# Patient Record
Sex: Male | Born: 1935 | Race: Black or African American | Hispanic: No | Smoking: Former smoker
Health system: Southern US, Community
[De-identification: ages and names within clinical notes are randomized; demographics above are authoritative.]

## PROBLEM LIST (undated history)

## (undated) DIAGNOSIS — F419 Anxiety disorder, unspecified: Secondary | ICD-10-CM

## (undated) DIAGNOSIS — C61 Malignant neoplasm of prostate: Secondary | ICD-10-CM

## (undated) DIAGNOSIS — E782 Mixed hyperlipidemia: Secondary | ICD-10-CM

## (undated) DIAGNOSIS — I2510 Atherosclerotic heart disease of native coronary artery without angina pectoris: Secondary | ICD-10-CM

## (undated) DIAGNOSIS — E119 Type 2 diabetes mellitus without complications: Secondary | ICD-10-CM

## (undated) DIAGNOSIS — R55 Syncope and collapse: Secondary | ICD-10-CM

## (undated) DIAGNOSIS — R001 Bradycardia, unspecified: Secondary | ICD-10-CM

## (undated) DIAGNOSIS — I10 Essential (primary) hypertension: Secondary | ICD-10-CM

## (undated) DIAGNOSIS — R569 Unspecified convulsions: Secondary | ICD-10-CM

## (undated) DIAGNOSIS — K219 Gastro-esophageal reflux disease without esophagitis: Secondary | ICD-10-CM

## (undated) DIAGNOSIS — F0390 Unspecified dementia without behavioral disturbance: Secondary | ICD-10-CM

## (undated) DIAGNOSIS — R1310 Dysphagia, unspecified: Secondary | ICD-10-CM

## (undated) HISTORY — DX: Syncope and collapse: R55

## (undated) HISTORY — DX: Essential (primary) hypertension: I10

## (undated) HISTORY — PX: STOMACH SURGERY: SHX791

## (undated) HISTORY — PX: CATARACT EXTRACTION: SUR2

## (undated) HISTORY — PX: OTHER SURGICAL HISTORY: SHX169

## (undated) HISTORY — PX: PROSTATE SURGERY: SHX751

## (undated) HISTORY — DX: Mixed hyperlipidemia: E78.2

---

## 1999-01-04 ENCOUNTER — Encounter: Payer: Self-pay | Admitting: Orthopedic Surgery

## 1999-01-04 ENCOUNTER — Ambulatory Visit (HOSPITAL_COMMUNITY): Admission: RE | Admit: 1999-01-04 | Discharge: 1999-01-04 | Payer: Self-pay | Admitting: Orthopedic Surgery

## 1999-03-09 ENCOUNTER — Encounter: Payer: Self-pay | Admitting: Orthopedic Surgery

## 1999-03-09 ENCOUNTER — Ambulatory Visit (HOSPITAL_COMMUNITY): Admission: RE | Admit: 1999-03-09 | Discharge: 1999-03-09 | Payer: Self-pay | Admitting: Orthopedic Surgery

## 1999-03-29 ENCOUNTER — Ambulatory Visit (HOSPITAL_COMMUNITY): Admission: RE | Admit: 1999-03-29 | Discharge: 1999-03-29 | Payer: Self-pay | Admitting: Orthopedic Surgery

## 1999-03-29 ENCOUNTER — Encounter: Payer: Self-pay | Admitting: Orthopedic Surgery

## 2002-05-05 ENCOUNTER — Ambulatory Visit (HOSPITAL_COMMUNITY): Admission: RE | Admit: 2002-05-05 | Discharge: 2002-05-06 | Payer: Self-pay

## 2004-03-03 ENCOUNTER — Encounter: Admission: RE | Admit: 2004-03-03 | Discharge: 2004-03-03 | Payer: Self-pay | Admitting: Orthopedic Surgery

## 2004-03-12 ENCOUNTER — Emergency Department (HOSPITAL_COMMUNITY): Admission: EM | Admit: 2004-03-12 | Discharge: 2004-03-12 | Payer: Self-pay | Admitting: Emergency Medicine

## 2004-03-13 ENCOUNTER — Encounter: Admission: RE | Admit: 2004-03-13 | Discharge: 2004-03-13 | Payer: Self-pay | Admitting: Orthopedic Surgery

## 2005-03-01 ENCOUNTER — Ambulatory Visit: Payer: Self-pay | Admitting: Cardiology

## 2005-04-11 ENCOUNTER — Ambulatory Visit: Admission: RE | Admit: 2005-04-11 | Discharge: 2005-04-11 | Payer: Self-pay | Admitting: Orthopedic Surgery

## 2005-05-01 ENCOUNTER — Ambulatory Visit: Payer: Self-pay | Admitting: Cardiology

## 2005-05-01 ENCOUNTER — Inpatient Hospital Stay (HOSPITAL_COMMUNITY): Admission: RE | Admit: 2005-05-01 | Discharge: 2005-05-02 | Payer: Self-pay | Admitting: Orthopedic Surgery

## 2005-05-09 ENCOUNTER — Ambulatory Visit: Payer: Self-pay | Admitting: Cardiology

## 2005-09-18 ENCOUNTER — Inpatient Hospital Stay (HOSPITAL_COMMUNITY): Admission: RE | Admit: 2005-09-18 | Discharge: 2005-09-19 | Payer: Self-pay | Admitting: Orthopedic Surgery

## 2008-02-26 ENCOUNTER — Emergency Department (HOSPITAL_COMMUNITY): Admission: EM | Admit: 2008-02-26 | Discharge: 2008-02-26 | Payer: Self-pay | Admitting: Emergency Medicine

## 2011-01-21 ENCOUNTER — Encounter: Payer: Self-pay | Admitting: Orthopedic Surgery

## 2011-02-10 ENCOUNTER — Encounter: Payer: Self-pay | Admitting: Cardiology

## 2011-02-11 DIAGNOSIS — R55 Syncope and collapse: Secondary | ICD-10-CM

## 2011-02-12 ENCOUNTER — Encounter: Payer: Self-pay | Admitting: Cardiology

## 2011-02-12 DIAGNOSIS — R55 Syncope and collapse: Secondary | ICD-10-CM

## 2011-02-12 DIAGNOSIS — I059 Rheumatic mitral valve disease, unspecified: Secondary | ICD-10-CM

## 2011-02-12 DIAGNOSIS — I495 Sick sinus syndrome: Secondary | ICD-10-CM

## 2011-02-13 ENCOUNTER — Encounter: Payer: Self-pay | Admitting: Cardiology

## 2011-02-22 ENCOUNTER — Encounter: Payer: Self-pay | Admitting: Cardiology

## 2011-04-13 ENCOUNTER — Encounter: Payer: Self-pay | Admitting: Cardiology

## 2011-04-16 ENCOUNTER — Encounter: Payer: Self-pay | Admitting: Cardiology

## 2011-04-17 ENCOUNTER — Encounter: Payer: Medicare Other | Admitting: Cardiology
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
## 2011-05-18 NOTE — Op Note (Signed)
[facility]  Patient:    Kenneth Potter, Kenneth Potter Visit Number: [id number] MRN: [id number]          Service Type: DSU Location: [phone] Attending Physician:  Meredith Leeds Dictated by:   Zigmund Daniel, M.D. Proc. Date: 05/05/02 Admit Date:  05/05/2002 Discharge Date: 05/06/2002   CC:         Rozanna Boer., M.D.   Operative Report  PREOPERATIVE DIAGNOSIS:  Direct left inguinal hernia.  POSTOPERATIVE DIAGNOSIS:  Direct left inguinal hernia.  OPERATION:  Repair of left inguinal hernia.  SURGEON:  Zigmund Daniel, M.D.  ANESTHESIA:  Local with sedation and monitored anesthesia care.  DESCRIPTION OF PROCEDURE:  After the patient was sedated and monitored, and routine preparation and draping of the left inguinal region, I liberally infused 0.5% Marcaine with epinephrine in the subcutaneous tissues and then the deeper tissues near the emergence of the ilioinguinal nerve.  I used more local anesthetic as I proceeded and the patient remained comfortable throughout the case.  I made a transverse incision just from the pubic tubercle laterally to about 5 cm and exposed the external oblique and external ring.  I opened the external oblique in the direction of the fibers into the external ring and a direct hernia was immediately apparent.  I encircled the spermatic cord with a Penrose drain and dissected the hernia away from the cord structures, taking care to avoid injury to the ilioinguinal nerve.  I freed up the cremaster to the level of the internal ring.  I then reduced the hernia and plugged the defect with a plug of polypropylene mesh and held in with a running 2-0 silk stitch.  I then dissected the proximal part of the spermatic cord, and found and reduced a small lipoma, but found no indirect hernia.  I then fashioned a patch of polypropylene mesh to fit the inguinal floor.  I made a slit in it for exit of the spermatic  cord and sewed it in place with running 2-0 Prolene stitch in the pubic tubercle medially and superiorly with a basting stitch in the anterior fascia of the internal oblique laterally and inferiorly, and then a running stitch in the shelving edge of the inguinal ligament.  I used a single suture to join the tails of the split mesh together lateral to the internal ring and felt that the hernia repair was secure.  I put the extra parts of the tails of the mesh laterally under the external oblique and then closed the external oblique with running 3-0 Vicryl and closed the subcutaneous tissue with running 0 Vicryl and closed the skin with intercuticular 4-0 Vicryl and Steri-Strips.  The patient was stable through the procedure. Dictated by:   Zigmund Daniel, M.D. Attending Physician:  Meredith Leeds DD:  05/05/02 TD:  05/06/02 Job: [id number]
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
## 2011-05-18 NOTE — Cardiovascular Report (Signed)
NAME:  DEMARRIO, MENGES NO.:  [id number]   MEDICAL RECORD NO.:  [id number]          PATIENT TYPE:  INP   LOCATION:  4734                         FACILITY:  [facility]   PHYSICIAN:  Arturo Morton. Riley Kill, M.D. [facility] OF BIRTH:  Jun 08, 1926   DATE OF PROCEDURE:  05/02/2005  DATE OF DISCHARGE:                              CARDIAC CATHETERIZATION   INDICATIONS:  Mr. Rhodes is a very delightful 77 year old gentleman who  presents with some atypical symptoms. He is scheduled to have his shoulder  operated on. He had a slightly abnormal Cardiolite. He was seen by Dr. Myrtis Ser  and scheduled for cardiac catheterization.   PROCEDURES:  1.  Left heart catheterization.  2.  Selective coronary arteriography.  3.  Selective left ventriculography.   DESCRIPTION OF PROCEDURE:  The patient was brought to the cath lab and  prepped and draped in the usual fashion. Through an anterior puncture, the  right femoral artery was easily entered.  A 6-French sheath was placed.  Central aortic and left ventricular pressures were measured. Because of  significant hypertension, hydralazine and intravenous Vasotec were both  administered to bring the blood pressure down. Arteriography was performed  with standard Judkins catheters. He tolerated procedure well and there were  no complications. We removed the sheath on the table, and I held the groin  for between 45 minutes and one hour. After the administration of the blood  pressure drugs, his blood pressure remained in the range of 130 to  145  thereafter. Overall he tolerated procedure well.   HEMODYNAMIC DATA.:  1.  Central aortic pressure 185/90.  2.  Left ventricular pressure 187/16.  3.  Less than 10 mm gradient on pullback across aortic valve.   ANGIOGRAPHIC DATA:  1.  The left main coronary artery is large and free of critical disease.  2.  The LAD courses to the apex. There are two major diagonal branches.      There are minimal luminal  irregularities.  The distal LAD wraps the apex      and supplies a significant portion of the inferior wall. No significant      abnormalities are identified.  3.  The circumflex provides basically three marginal branches. The first two      marginal branches are moderate size and bifurcate distally.  The third      is really quite small and supplies the posterolateral segment. No areas      of high-grade focal obstruction are noted.  4.  The right coronary artery is markedly tortuous and supplies a single      PDA. This vessel demonstrates some mild eccentric plaquing of about 20      to 30% and then a segmental area of 50% in the mid vessel that does not      appear to be high grade or flow-limiting. The MLD appears to be in      excess of 2 mm.   Ventriculography in the RAO projection reveals preserved global systolic  function. No segmental abnormalities or contraction are identified.   CONCLUSION:  1.  Well-preserved left ventricular function.  2.  Segmental 50% narrowing in the mid right coronary artery without      critical stenosis.   DISPOSITION:  At the present time there is no anatomic findings that would  require intervention or bypass surgery.      TDS/MEDQ  D:  05/02/2005  T:  05/02/2005  Job:  [id number]   cc:   Kirstie Peri, MD  [address]  [place] [address]  Fax: [phone]   Willa Rough, M.D.   [facility]

## 2011-05-18 NOTE — Op Note (Signed)
NAME:  Kenneth Potter, Kenneth Potter NO.:  [id number]   MEDICAL RECORD NO.:  [id number]          PATIENT TYPE:  INP   LOCATION:  2858                         FACILITY:  [facility]   PHYSICIAN:  Lenard Galloway. Mortenson, M.D.DATE OF BIRTH:  1926-03-11   DATE OF PROCEDURE:  09/18/2005  DATE OF DISCHARGE:                                 OPERATIVE REPORT   PREOPERATIVE DIAGNOSIS:  Severe osteoarthritis of right shoulder.   POSTOPERATIVE DIAGNOSIS:  Severe osteoarthritis of right shoulder.   PROCEDURE:  Hemiarthroplasty of right shoulder using a DePuy Global  Advantage humeral stem, 10-mm diameter, 138-mm length, with a Global  Advantage humeral head size 52 x 18, press fit.   SURGEON:  Lenard Galloway. Chaney Malling, M.D.   ASSISTANT:  Marshall Kenneth Potter.   ANESTHESIA:  General.   PROCEDURE:  The patient was placed on the operating table in the supine  position.  After satisfactory general anesthesia, the patient was placed in  the semi-sitting position.  Her right upper extremity and shoulder was then  prepped with DuraPrep and draped out in the usual manner.  An incision was  made through the deltopectoral area where her previous incision was made.  Skin edges were retracted.  Bleeders were coagulated.  The deltopectoral  interval was developed with a finger, and dissection was carried down to the  anterior aspect of the shoulder.  The coracoid was clearly visualized.  The  deltoid was retracted laterally.  The subscapularis was seen.  There was a  great deal of scarring about the shoulder.  The subscapularis was tagged and  released off its attachment to the upper humeral shaft and reflected in the  midline.  The shoulder joint itself was then opened.  There was severe  flattening and loss of all articular cartilage off the humeral head.  There  was marked deformity of the humeral head.  A drill hole was placed in the  proximal humeral head just adjacent to the greater tuberosity.  A series of  broaches was passed down the humeral canal, and this was all done by hand.  This was reamed out to a 10-mm diameter.  The external guide was then placed  on the reamer and 30 degrees of retroversion was set into the cutting guide.  The humeral head was then amputated following the cutting guide.  A nice  flush cut was made.  The guide was removed.  The reamer was removed.  A 6-mm  broach was passed down the humeral canal.  Then an 8-mm broach was passed  down and this seated very nicely.  A series of different humeral heads was  tried, and the 52 diameter x 18 mm length humeral head seemed to fit very  nicely and was quite stable.  This was removed.  There was a series of  osteophytes over the posterior aspect of the humeral head, and these were  removed.  There was a great deal of scarring of the posterior capsule and  inferior capsule, and this was very carefully stripped off the glenoid.  Once this was all accomplished, there was  fairly good motion about the  shoulder.  There was translocation of the prosthesis about 50% anteriorly  and with full external rotation, the shoulder would not dislocate.  This was  felt to be in perfect position with a perfect size head length and diameter.  All these components were removed.  The shoulder was irrigated with copious  amounts of saline solution.  All debris was removed.  Loose pieces of bone  were found in the axillary recess and removed.  The 10-mm broach was then  passed down the humeral shaft and seated very nicely in the cut surface.  This was felt to be the perfect size prosthesis.  The final prosthesis was  then driven down into position.  The trial head was articulated and the  shoulder put through a full range of motion.  This was very stable with no  instability.  The trial humeral head was removed, and the final humeral head  was put in place and tapped to make a __________  Again, the shoulder was  put through a full range of motion  and was quite stable. It had good motion.  The subscapularis was reattached with 0 Ethibond.  A 0 Vicryl was used to  reattach a portion of deltoid near the clavicle.  A 2-0 Vicryl was used to  close the subcutaneous tissues, and stainless steel staples were used to  close the skin.  Sterile dressing was applied and a shoulder immobilizer  applied and the patient returned to the recovery room in excellent  condition.  In fact, the case went extremely well.   DRAINS:  None.   COMPLICATIONS:  None.  I was very pleased with the surgical outcome.           ______________________________  Lenard Galloway. Chaney Malling, M.D.     RAM/MEDQ  D:  09/18/2005  T:  09/18/2005  Job:  [id number]

## 2011-05-18 NOTE — Discharge Summary (Signed)
NAME:  BALDO, HUFNAGLE NO.:  [id number]   MEDICAL RECORD NO.:  [id number]          PATIENT TYPE:  INP   LOCATION:  4734                         FACILITY:  [facility]   PHYSICIAN:  Lenard Galloway. Mortenson, M.D.DATE OF BIRTH:  1926-06-15   DATE OF ADMISSION:  05/01/2005  DATE OF DISCHARGE:  05/02/2005                                 DISCHARGE SUMMARY   ADDENDUM:  As part of his evaluation, Mr. Deleeuw had a lipid profile  performed. It showed a total cholesterol of 162, triglycerides 48, HDL 42,  LDL 110. The patient had Zocor 40 milligrams added to his medication  regimen. He is to follow up with a lipid profile and liver function testing  in 6 weeks.      RB/MEDQ  D:  05/02/2005  T:  05/02/2005  Job:  [id number]   cc:   Clelia Croft, M.D.  [place] in [place], [place]

## 2012-02-12 DIAGNOSIS — R42 Dizziness and giddiness: Secondary | ICD-10-CM

## 2012-02-12 DIAGNOSIS — I498 Other specified cardiac arrhythmias: Secondary | ICD-10-CM

## 2012-02-26 ENCOUNTER — Encounter: Payer: Self-pay | Admitting: Cardiovascular Disease

## 2012-02-26 ENCOUNTER — Ambulatory Visit (INDEPENDENT_AMBULATORY_CARE_PROVIDER_SITE_OTHER): Payer: Medicare Other | Admitting: Cardiovascular Disease

## 2012-02-26 DIAGNOSIS — R42 Dizziness and giddiness: Secondary | ICD-10-CM

## 2012-02-26 DIAGNOSIS — R001 Bradycardia, unspecified: Secondary | ICD-10-CM | POA: Insufficient documentation

## 2012-02-26 DIAGNOSIS — I498 Other specified cardiac arrhythmias: Secondary | ICD-10-CM

## 2012-02-26 NOTE — Progress Notes (Signed)
HPI  This is an 78 year old male who is here today for followup visit after recent hospitalization. He was briefly hospitalized at [facility] due to dizziness. He was found to be mildly bradycardic with the lowest heart rate of 44 beats per minute. Some of his symptoms were felt to be due to vertigo. He did not have any other arrhythmia on telemetry. He had a similar presentation in early 2012. At that time, he had an echocardiogram and a stress test. Both of them were unremarkable. He had a cardiac catheterization 2006 which showed no evidence of obstructive coronary artery disease. There was a moderate stenosis in the right coronary artery. The patient denies any chest pain or dyspnea. His dizziness has improved since hospital discharge. It may happens in the morning and when he tries to stand up suddenly.  Allergies  Allergen Reactions  . Aspirin     Can not tolerate in high dosage     No current outpatient prescriptions on file prior to visit.     Past Medical History  Diagnosis Date  . Essential hypertension, benign   . Mixed hyperlipidemia   . Near syncope      Past Surgical History  Procedure Date  . Bilateral shoulder surgery   . Prostate surgery   . Stomach surgery     removal of 1/3 of stomach and intestine     History reviewed. No pertinent family history.   History   Social History  . Marital Status: Married    Spouse Name: [name]    Number of Children: N/A  . Years of Education: N/A   Occupational History  . Retired    Social History Main Topics  . Smoking status: Former Smoker    Types: Cigarettes  . Smokeless tobacco: Never Used  . Alcohol Use: No     Quit drinking 25 years ago  . Drug Use: No     Denies any history of illicit drug use  . Sexually Active: Not on file   Other Topics Concern  . Not on file   Social History Narrative  . No narrative on file     PHYSICAL EXAM   BP 154/87  Pulse 66  Ht 5\' 1"  (1.549 m)  Wt 151 lb  (68.493 kg)  BMI 28.53 kg/m2  Constitutional: He is oriented to person, place, and time. He appears well-developed and well-nourished. No distress.  HENT: No nasal discharge.  Head: Normocephalic and atraumatic.  Eyes: Pupils are equal and round. Right eye exhibits no discharge. Left eye exhibits no discharge.  Neck: Normal range of motion. Neck supple. No JVD present. No thyromegaly present.  Cardiovascular: Normal rate, regular rhythm, normal heart sounds and. Exam reveals no gallop and no friction rub. No murmur heard.  Pulmonary/Chest: Effort normal and breath sounds normal. No stridor. No respiratory distress. He has no wheezes. He has no rales. He exhibits no tenderness.  Abdominal: Soft. Bowel sounds are normal. He exhibits no distension. There is no tenderness. There is no rebound and no guarding.  Musculoskeletal: Normal range of motion. He exhibits no edema and no tenderness.  Neurological: He is alert and oriented to person, place, and time. Coordination normal.  Skin: Skin is warm and dry. No rash noted. He is not diaphoretic. No erythema. No pallor.  Psychiatric: He has a normal mood and affect. His behavior is normal. Judgment and thought content normal.        ASSESSMENT AND PLAN

## 2012-02-26 NOTE — Assessment & Plan Note (Signed)
Some of his symptoms seem to be due to vertigo. This has not been associated with any other symptoms such as nausea, vomiting or neurologic symptoms. There is no strong suspicion for posterior cerebral ischemia.

## 2012-02-26 NOTE — Patient Instructions (Signed)
Your physician recommends that you schedule a follow-up appointment in: as needed Your physician recommends that you continue on your current medications as directed. Please refer to the Current Medication list given to you today.   

## 2012-02-26 NOTE — Assessment & Plan Note (Signed)
The patient's heart rate is currently 66 beats per minute. He does have known history of bradycardia which has not been severe enough to require a permanent pacemaker. The lowest heart rate that he had was 42 beats per minute. There is no evidence of high-grade AV block. His symptoms have improved. I recommend avoiding medications that can cause bradycardia. If this worsens in the future, a pacemaker might be needed. I did not repeat the patient's cardiac workup given that it was unremarkable in 2012. He can return to see [place] as needed.

## 2012-02-29 DEATH — deceased

## 2012-07-02 ENCOUNTER — Encounter (HOSPITAL_COMMUNITY): Payer: Self-pay | Admitting: Family Medicine

## 2012-07-02 ENCOUNTER — Ambulatory Visit (HOSPITAL_COMMUNITY)
Admission: AD | Admit: 2012-07-02 | Discharge: 2012-07-07 | Disposition: A | Discharge status: Home Health | Payer: Medicare Other | Source: Other Acute Inpatient Hospital | Attending: Internal Medicine | Admitting: Internal Medicine

## 2012-07-02 ENCOUNTER — Other Ambulatory Visit: Payer: Self-pay | Admitting: Physician Assistant

## 2012-07-02 ENCOUNTER — Ambulatory Visit (INDEPENDENT_AMBULATORY_CARE_PROVIDER_SITE_OTHER): Payer: Medicare Other | Admitting: Physician Assistant

## 2012-07-02 ENCOUNTER — Encounter: Payer: Self-pay | Admitting: Physician Assistant

## 2012-07-02 ENCOUNTER — Encounter (HOSPITAL_COMMUNITY): Payer: Self-pay | Admitting: Pharmacy Technician

## 2012-07-02 VITALS — BP 128/72 | HR 61 | Ht 64.0 in | Wt 151.4 lb

## 2012-07-02 DIAGNOSIS — Z79899 Other long term (current) drug therapy: Secondary | ICD-10-CM | POA: Insufficient documentation

## 2012-07-02 DIAGNOSIS — R0789 Other chest pain: Secondary | ICD-10-CM

## 2012-07-02 DIAGNOSIS — R7309 Other abnormal glucose: Secondary | ICD-10-CM | POA: Insufficient documentation

## 2012-07-02 DIAGNOSIS — I200 Unstable angina: Secondary | ICD-10-CM

## 2012-07-02 DIAGNOSIS — E782 Mixed hyperlipidemia: Secondary | ICD-10-CM | POA: Diagnosis present

## 2012-07-02 DIAGNOSIS — Z7902 Long term (current) use of antithrombotics/antiplatelets: Secondary | ICD-10-CM | POA: Insufficient documentation

## 2012-07-02 DIAGNOSIS — R079 Chest pain, unspecified: Secondary | ICD-10-CM

## 2012-07-02 DIAGNOSIS — I498 Other specified cardiac arrhythmias: Secondary | ICD-10-CM | POA: Insufficient documentation

## 2012-07-02 DIAGNOSIS — R0609 Other forms of dyspnea: Secondary | ICD-10-CM | POA: Insufficient documentation

## 2012-07-02 DIAGNOSIS — R0989 Other specified symptoms and signs involving the circulatory and respiratory systems: Secondary | ICD-10-CM | POA: Insufficient documentation

## 2012-07-02 DIAGNOSIS — I2510 Atherosclerotic heart disease of native coronary artery without angina pectoris: Secondary | ICD-10-CM

## 2012-07-02 DIAGNOSIS — Z8546 Personal history of malignant neoplasm of prostate: Secondary | ICD-10-CM | POA: Insufficient documentation

## 2012-07-02 DIAGNOSIS — R55 Syncope and collapse: Secondary | ICD-10-CM | POA: Insufficient documentation

## 2012-07-02 DIAGNOSIS — I10 Essential (primary) hypertension: Secondary | ICD-10-CM | POA: Insufficient documentation

## 2012-07-02 HISTORY — DX: Atherosclerotic heart disease of native coronary artery without angina pectoris: I25.10

## 2012-07-02 HISTORY — DX: Bradycardia, unspecified: R00.1

## 2012-07-02 LAB — CARDIAC PANEL(CRET KIN+CKTOT+MB+TROPI)
CK, MB: 1.8 ng/mL (ref 0.3–4.0)
Relative Index: INVALID (ref 0.0–2.5)
Troponin I: <0.3 ng/mL (ref <0.30)

## 2012-07-02 MED ORDER — ONDANSETRON HCL 4 MG/2ML IJ SOLN: 4.0000 mg | Freq: Four times a day (QID) | INTRAMUSCULAR | Status: DC | PRN

## 2012-07-02 MED ORDER — SODIUM CHLORIDE 0.9 % IV SOLN: INTRAVENOUS | Status: DC

## 2012-07-02 MED ORDER — HEPARIN (PORCINE) IN NACL 100-0.45 UNIT/ML-% IJ SOLN
950.0000 [IU]/h | INTRAMUSCULAR | Status: DC
  Administered 2012-07-02 – 2012-07-03 (×2): 950 [IU]/h via INTRAVENOUS
  Filled 2012-07-02 (×2): qty 250

## 2012-07-02 MED ORDER — HYDRALAZINE HCL 20 MG/ML IJ SOLN
10.0000 mg | INTRAMUSCULAR | Status: DC | PRN
Start: 2012-07-02 — End: 2012-07-07
  Filled 2012-07-02: qty 0.5

## 2012-07-02 MED ORDER — SIMVASTATIN 20 MG PO TABS
20.0000 mg | ORAL_TABLET | Freq: Every day | ORAL | Status: DC
  Administered 2012-07-02 – 2012-07-06 (×5): 20 mg via ORAL
  Filled 2012-07-02 (×6): qty 1

## 2012-07-02 MED ORDER — LISINOPRIL 20 MG PO TABS
20.0000 mg | ORAL_TABLET | Freq: Two times a day (BID) | ORAL | Status: DC
  Administered 2012-07-02 – 2012-07-07 (×10): 20 mg via ORAL
  Filled 2012-07-02 (×13): qty 1

## 2012-07-02 MED ORDER — SODIUM CHLORIDE 0.9 % IJ SOLN
3.0000 mL | INTRAMUSCULAR | Status: DC | PRN
  Administered 2012-07-02: 3 mL via INTRAVENOUS

## 2012-07-02 MED ORDER — AMLODIPINE BESYLATE 10 MG PO TABS
10.0000 mg | ORAL_TABLET | Freq: Every day | ORAL | Status: DC
  Administered 2012-07-02 – 2012-07-07 (×6): 10 mg via ORAL
  Filled 2012-07-02 (×6): qty 1

## 2012-07-02 MED ORDER — NITROGLYCERIN 0.4 MG SL SUBL
0.4000 mg | SUBLINGUAL_TABLET | SUBLINGUAL | Status: DC | PRN
  Filled 2012-07-02: qty 25

## 2012-07-02 MED ORDER — HEPARIN BOLUS VIA INFUSION
3000.0000 [IU] | Freq: Once | INTRAVENOUS | Status: AC
  Administered 2012-07-02: 3000 [IU] via INTRAVENOUS
  Filled 2012-07-02: qty 3000

## 2012-07-02 MED ORDER — ASPIRIN EC 81 MG PO TBEC
81.0000 mg | DELAYED_RELEASE_TABLET | Freq: Every day | ORAL | Status: DC
  Administered 2012-07-02 – 2012-07-07 (×5): 81 mg via ORAL
  Filled 2012-07-02 (×6): qty 1

## 2012-07-02 MED ORDER — SODIUM CHLORIDE 0.9 % IJ SOLN: 3.0000 mL | INTRAMUSCULAR | Status: DC | PRN

## 2012-07-02 MED ORDER — AMLODIPINE BESYLATE 5 MG PO TABS
5.0000 mg | ORAL_TABLET | Freq: Every day | ORAL | Status: DC
  Filled 2012-07-02: qty 1

## 2012-07-02 MED ORDER — DIAZEPAM 5 MG PO TABS
5.0000 mg | ORAL_TABLET | ORAL | Status: AC
  Administered 2012-07-04: 5 mg via ORAL
  Filled 2012-07-02: qty 1

## 2012-07-02 MED ORDER — SODIUM CHLORIDE 0.9 % IJ SOLN
3.0000 mL | Freq: Two times a day (BID) | INTRAMUSCULAR | Status: DC
  Administered 2012-07-02 – 2012-07-06 (×8): 3 mL via INTRAVENOUS

## 2012-07-02 MED ORDER — ASPIRIN 81 MG PO CHEW: 324.0000 mg | CHEWABLE_TABLET | ORAL | Status: DC

## 2012-07-02 MED ORDER — SODIUM CHLORIDE 0.9 % IV SOLN: 250.0000 mL | INTRAVENOUS | Status: DC | PRN

## 2012-07-02 MED ORDER — SODIUM CHLORIDE 0.9 % IJ SOLN
3.0000 mL | Freq: Two times a day (BID) | INTRAMUSCULAR | Status: DC
  Administered 2012-07-02 – 2012-07-03 (×3): 3 mL via INTRAVENOUS

## 2012-07-02 MED ORDER — ACETAMINOPHEN 325 MG PO TABS
650.0000 mg | ORAL_TABLET | ORAL | Status: DC | PRN
  Administered 2012-07-02 – 2012-07-03 (×2): 650 mg via ORAL
  Filled 2012-07-02 (×2): qty 2

## 2012-07-02 MED ORDER — SODIUM CHLORIDE 0.9 % IV SOLN
250.0000 mL | INTRAVENOUS | Status: DC | PRN
Start: 2012-07-02 — End: 2012-07-07

## 2012-07-02 MED ORDER — NITROGLYCERIN IN D5W 200-5 MCG/ML-% IV SOLN
2.0000 ug/min | INTRAVENOUS | Status: DC
  Filled 2012-07-02: qty 250

## 2012-07-02 NOTE — Assessment & Plan Note (Signed)
Patient presents with new onset chest discomfort, typically precipitated by walking, with acceleration over the last week. He has no known CAD, with history of a negative catheterization in 2006, and a negative, nondiagnostic stress test, 2/12. He has history of normal LV function. Cardiac risk factors are notable for HTN, HLD, age, and remote tobacco. He is currently hemodynamically stable, and 12-lead EKG is negative for acute changes. However, patient's presenting symptoms are quite worrisome for significant CAD, and recommendation is to refer him directly to [facility] ED for initial blood work and repeat EKG. We will then plan to either transfer him directly to [facility] later today, or in the a.m., for recommended cardiac catheterization. Patient is agreeable with the plan for a repeat catheterization, to rule out significant CAD. If cardiac markers are abnormal, he will need to be started on IV heparin. We will defer treatment with Plavix, given his advanced age and possibility of multivessel CAD.

## 2012-07-02 NOTE — Progress Notes (Signed)
ANTICOAGULATION CONSULT NOTE - Initial Consult  Pharmacy Consult for Heparin Indication: chest pain/ACS  Allergies  Allergen Reactions  . Aspirin     Can not tolerate in high dosage    Patient Measurements:   Heparin Dosing Weight: 68.7kg  Vital Signs: Temp: 98.1 F (36.7 C) (07/03 2038) Temp src: Oral (07/03 2038) BP: 189/76 mmHg (07/03 1950) Pulse Rate: 57  (07/03 1950)  Labs: No results found for this basename: HGB:2,HCT:3,PLT:3,APTT:3,LABPROT:3,INR:3,HEPARINUNFRC:3,CREATININE:3,CKTOTAL:3,CKMB:3,TROPONINI:3 in the last 72 hours  CrCl is unknown because no creatinine reading has been taken.   Medical History: Past Medical History  Diagnosis Date  . Essential hypertension, benign   . Mixed hyperlipidemia   . Near syncope   . Anginal pain   . Shortness of breath   . Cancer     Prostate cancer   Assessment: 86yom with Hx CAD presented with CP at MD office today.  He was sent to [facility] ED started on NTG, cardiac enzymes were negative x1, CBC, renal fx stable-done at [facility] .  Amitted to [facility] for ACS.  Heparin drip to start.   Goal of Therapy:  Heparin level 0.3-0.7 units/ml Monitor platelets by anticoagulation protocol: Yes   Plan:  Heparin bolus 3000 uts IV x1 Heparin drip 950 uts/hr Daily Heparin level, CBC  Marcelino Scot 07/02/2012,8:40 PM

## 2012-07-02 NOTE — Progress Notes (Signed)
Primary Cardiologist: Kenneth Bunting, MD   HPI: Patient presents with complaint of recent development of CP, both with/without exertion, but typically precipitated by walking after short distance. He states that this is new, and dissimilar from his usual reflux symptoms. He describes it as dull, with no radiation to jaw or upper extremities. There is some associated dyspnea, but no diaphoresis or nausea. It is particularly intense (9/10), and is relieved after several minutes of rest. He suggests some acceleration over this past week. He had some earlier this morning, as he was getting dressed, and which resolve with rest.  12-lead EKG in office today, reviewed by me, indicates NSR with isolated PVC, and no acute changes.  Patient has no known CAD. Cardiac catheterization in 2006 was negative for significant CAD. He had a negative, inadequate (69% PMHR) exercise stress Cardiolite, 2/12, reviewed by Dr. Andee Potter, which was negative for definite ischemia; EF 61%. A 2-D echo at that time indicated normal LVF (EF 60-65%), with diastolic dysfunction, and mild MR and mild TR.  Allergies  Allergen Reactions  . Aspirin     Can not tolerate in high dosage    Current Outpatient Prescriptions  Medication Sig Dispense Refill  . amLODipine (NORVASC) 5 MG tablet Take 5 mg by mouth daily.      [name] lisinopril (PRINIVIL,ZESTRIL) 20 MG tablet Take 20 mg by mouth 2 (two) times daily.      . simvastatin (ZOCOR) 20 MG tablet Take 20 mg by mouth every evening.        Past Medical History  Diagnosis Date  . Essential hypertension, benign   . Mixed hyperlipidemia   . Near syncope     Past Surgical History  Procedure Date  . Bilateral shoulder surgery   . Prostate surgery   . Stomach surgery     removal of 1/3 of stomach and intestine    History   Social History  . Marital Status: Married    Spouse Name: Kenneth Potter    Number of Children: N/A  . Years of Education: N/A   Occupational History  . Retired     Social History Main Topics  . Smoking status: Former Smoker    Types: Cigarettes  . Smokeless tobacco: Never Used  . Alcohol Use: No     Quit drinking 25 years ago  . Drug Use: No     Denies any history of illicit drug use  . Sexually Active: Not on file   Other Topics Concern  . Not on file   Social History Narrative  . No narrative on file    No family history on file.  ROS: no nausea, vomiting; no fever, chills; no melena, hematochezia; no claudication  PHYSICAL EXAM: BP 128/72  Pulse 61  Ht 5\' 4"  (1.626 m)  Wt 151 lb 6.4 oz (68.675 kg)  BMI 25.99 kg/m2 GENERAL: 78 year-old male, sitting upright; NAD HEENT: NCAT, PERRLA, EOMI; sclera clear; no xanthelasma NECK: palpable bilateral carotid pulses, no bruits; no JVD; no TM LUNGS: CTA bilaterally CARDIAC: RRR (S1, S2); no significant murmurs; no rubs or gallops ABDOMEN: soft, non-tender; intact BS EXTREMETIES: intact femoral and distal pulses, no femoral bruits; no significant peripheral edema SKIN: warm/dry; no obvious rash/lesions MUSCULOSKELETAL: no joint deformity NEURO: no focal deficit; NL affect   EKG: reviewed and available in Electronic Records   ASSESSMENT & PLAN:  Unstable angina pectoris  Patient presents with new onset chest discomfort, typically precipitated by walking, with acceleration over the last week. He  has no known CAD, with history of a negative catheterization in 2006, and a negative, nondiagnostic stress test, 2/12. He has history of normal LV function. Cardiac risk factors are notable for HTN, HLD, age, and remote tobacco. He is currently hemodynamically stable, and 12-lead EKG is negative for acute changes. However, patient's presenting symptoms are quite worrisome for significant CAD, and recommendation is to refer him directly to [facility] ED for initial blood work and repeat EKG. We will then plan to either transfer him directly to [facility] later today, or in the a.m., for recommended cardiac  catheterization. Patient is agreeable with the plan for a repeat catheterization, to rule out significant CAD. If cardiac markers are abnormal, he will need to be started on IV heparin. We will defer treatment with Plavix, given his advanced age and possibility of multivessel CAD.    Kenneth Potter, PAC

## 2012-07-02 NOTE — Patient Instructions (Addendum)
Go to [facility] Emergency Department. Orders sent with patient.

## 2012-07-02 NOTE — H&P (Signed)
Primary Cardiologist: Lewayne Bunting, MD     HPI: Patient presents with complaint of recent development of CP, both with/without exertion, but typically precipitated by walking after short distance. He states that this is new, and dissimilar from his usual reflux symptoms. He describes it as dull, with no radiation to jaw or upper extremities. There is some associated dyspnea, but no diaphoresis or nausea. It is particularly intense (9/10), and is relieved after several minutes of rest. He suggests some acceleration over this past week. He had some earlier this morning, as he was getting dressed, and which resolve with rest.   12-lead EKG in office today, reviewed by me, indicates NSR with isolated PVC, and no acute changes.   Patient has no known CAD. Cardiac catheterization in 2006 was negative for significant CAD. He had a negative, inadequate (69% PMHR) exercise stress Cardiolite, 2/12, reviewed by Dr. Andee Lineman, which was negative for definite ischemia; EF 61%. A 2-D echo at that time indicated normal LVF (EF 60-65%), with diastolic dysfunction, and mild MR and mild TR.    Allergies   Allergen  Reactions   .  Aspirin         Can not tolerate in high dosage       Current Outpatient Prescriptions   Medication  Sig  Dispense  Refill   .  amLODipine (NORVASC) 5 MG tablet  Take 5 mg by mouth daily.         [name]  lisinopril (PRINIVIL,ZESTRIL) 20 MG tablet  Take 20 mg by mouth 2 (two) times daily.         .  simvastatin (ZOCOR) 20 MG tablet  Take 20 mg by mouth every evening.             Past Medical History   Diagnosis  Date   .  Essential hypertension, benign     .  Mixed hyperlipidemia     .  Near syncope         Past Surgical History   Procedure  Date   .  Bilateral shoulder surgery     .  Prostate surgery     .  Stomach surgery         removal of 1/3 of stomach and intestine       History       Social History   .  Marital Status:  Married       Spouse Name:  [name]    Number of Children:  N/A   .  Years of Education:  N/A       Occupational History   .  Retired         Social History Main Topics   .  Smoking status:  Former Smoker       Types:  Cigarettes   .  Smokeless tobacco:  Never Used   .  Alcohol Use:  No         Quit drinking 25 years ago   .  Drug Use:  No         Denies any history of illicit drug use   .  Sexually Active:  Not on file       Other Topics  Concern   .  Not on file       Social History Narrative   .  No narrative on file      No family history on file.   ROS: no nausea, vomiting; no fever, chills; no melena, hematochezia;  no claudication   PHYSICAL EXAM: BP 128/72  Pulse 61  Ht 5\' 4"  (1.626 m)  Wt 151 lb 6.4 oz (68.675 kg)  BMI 25.99 kg/m2 GENERAL: 78 year-old male, sitting upright; NAD HEENT: NCAT, PERRLA, EOMI; sclera clear; no xanthelasma NECK: palpable bilateral carotid pulses, no bruits; no JVD; no TM LUNGS: CTA bilaterally CARDIAC: RRR (S1, S2); no significant murmurs; no rubs or gallops ABDOMEN: soft, non-tender; intact BS EXTREMETIES: intact femoral and distal pulses, no femoral bruits; no significant peripheral edema SKIN: warm/dry; no obvious rash/lesions MUSCULOSKELETAL: no joint deformity NEURO: no focal deficit; NL affect  EKG: reviewed and available in Electronic Records  ASSESSMENT & PLAN:   Unstable angina pectoris  Patient presents with new onset chest discomfort, typically precipitated by walking, with acceleration over the last week. He has no known CAD, with history of a negative catheterization in 2006, and a negative, nondiagnostic stress test, 2/12. He has history of normal LV function. Cardiac risk factors are notable for HTN, HLD, age, and remote tobacco. He is currently hemodynamically stable, and 12-lead EKG is negative for acute changes. However, patient's presenting symptoms are quite worrisome for significant CAD, and recommendation is to refer him directly to [facility]  ED for initial blood work and repeat EKG. We will then plan to either transfer him directly to [facility] later today, or in the a.m., for recommended cardiac catheterization. Patient is agreeable with the plan for a repeat catheterization, to rule out significant CAD. If cardiac markers are abnormal, he will need to be started on IV heparin. We will defer treatment with Plavix, given his advanced age and possibility of multivessel CAD.     Gene Serpe, PAC _____________  ADDENDUM     See details of H&P as outlined above. Pt arrived from [facility] hospital this evening.  He is currently chest pain free though complains of a headache (on ntg @ 54mcg/min).  Exam is otw unchanged from above.  Filed Vitals:   07/02/12 1950  BP: 189/76  Pulse: 57  Temp: 97.9 F (36.6 C)  Resp: 21   Labs and ECG's reviewed from [facility] ED.  CBC, BMET, Coags, CE are WNL (results on paper chart).  BP is currently elevated.  Resume home meds and titrate for HTN.  Will d/c ntg given headache, lack of c/p, and negative enzymes so far.  Cont to cycle CE.  Plan cath on Friday or sooner if recurrent/recalcitrant chest pain.  Nicolasa Ducking NP I have taken a history, reviewed medications, allergies, PMH, SH, FH, and reviewed ROS and examined the patient.  I agree with the assessment and plan.  Ohm Dentler C. Daleen Squibb, MD, [facility] Pager:  [phone]

## 2012-07-03 DIAGNOSIS — I200 Unstable angina: Secondary | ICD-10-CM

## 2012-07-03 LAB — BASIC METABOLIC PANEL
BUN: 12 mg/dL (ref 6–23)
Chloride: 104 mEq/L (ref 96–112)
GFR calc Af Amer: 75 mL/min — ABNORMAL LOW (ref >90)
GFR calc non Af Amer: 65 mL/min — ABNORMAL LOW (ref >90)
Potassium: 3.6 mEq/L (ref 3.5–5.1)
Sodium: 139 mEq/L (ref 135–145)

## 2012-07-03 LAB — CBC
HCT: 39.1 % (ref 39.0–52.0)
Hemoglobin: 13.7 g/dL (ref 13.0–17.0)
MCH: 32.4 pg (ref 26.0–34.0)
MCHC: 35 g/dL (ref 30.0–36.0)
MCV: 92.4 fL (ref 78.0–100.0)
Platelets: 157 K/uL (ref 150–400)
RBC: 4.23 MIL/uL (ref 4.22–5.81)
RDW: 13.3 % (ref 11.5–15.5)
WBC: 7.6 K/uL (ref 4.0–10.5)

## 2012-07-03 LAB — HEMOGLOBIN A1C: Mean Plasma Glucose: 171 mg/dL — ABNORMAL HIGH (ref <117)

## 2012-07-03 LAB — TSH: TSH: 1.495 u[IU]/mL (ref 0.350–4.500)

## 2012-07-03 LAB — LIPID PANEL
Cholesterol: 116 mg/dL (ref 0–200)
HDL: 36 mg/dL — ABNORMAL LOW (ref >39)
Total CHOL/HDL Ratio: 3.2 RATIO
Triglycerides: 82 mg/dL (ref <150)
VLDL: 16 mg/dL (ref 0–40)

## 2012-07-03 LAB — HEPARIN LEVEL (UNFRACTIONATED): Heparin Unfractionated: 0.56 [IU]/mL (ref 0.30–0.70)

## 2012-07-03 LAB — CARDIAC PANEL(CRET KIN+CKTOT+MB+TROPI): Troponin I: <0.3 ng/mL (ref <0.30)

## 2012-07-03 MED ORDER — ASPIRIN 81 MG PO CHEW
324.0000 mg | CHEWABLE_TABLET | ORAL | Status: AC
Start: 2012-07-04 — End: 2012-07-04
  Administered 2012-07-04: 324 mg via ORAL
  Filled 2012-07-03: qty 4

## 2012-07-03 NOTE — Progress Notes (Signed)
ANTICOAGULATION CONSULT NOTE - Initial Consult  Pharmacy Consult for Heparin Indication: chest pain/ACS  Allergies  Allergen Reactions  . Aspirin     Can not tolerate in high dosage    Patient Measurements: Height: 5\' 4"  (162.6 cm) Weight: 147 lb 4.3 oz (66.8 kg) IBW/kg (Calculated) : 59.2  Heparin Dosing Weight: 68.7kg  Vital Signs: Temp: 98.1 F (36.7 C) (07/04 0357) Temp src: Oral (07/04 0357) BP: 131/75 mmHg (07/04 0357) Pulse Rate: 50  (07/04 0357)  Labs:  Alvira Philips 07/03/12 0320 07/02/12 2123  HGB 13.7 --  HCT 39.1 --  PLT 157 --  APTT -- --  LABPROT -- --  INR -- --  HEPARINUNFRC 0.56 --  CREATININE -- --  CKTOTAL -- 77  CKMB -- 1.8  TROPONINI -- <0.30    CrCl is unknown because no creatinine reading has been taken.  Assessment: 78 yo male with chest pain for Heparin    Goal of Therapy:  Heparin level 0.3-0.7 units/ml Monitor platelets by anticoagulation protocol: Yes   Plan:  Continue Heparin at current rate  Eddie Candle 07/03/2012,4:12 AM

## 2012-07-03 NOTE — Progress Notes (Signed)
 SUBJECTIVE:  Mild chest pain off an on through the night.     PHYSICAL EXAM Filed Vitals:   07/02/12 2200 07/02/12 2300 07/03/12 0357 07/03/12 0747  BP: 151/73 122/75 131/75 144/69  Pulse: 68 57 50 49  Temp:  98.1 F (36.7 C) 98.1 F (36.7 C) 98 F (36.7 C)  TempSrc:  Oral Oral Oral  Resp: 17 17 14 15  Height:      Weight:      SpO2: 98% 98% 99% 97%   General:  No distress Lungs:  Few basilar crackles Heart:  RRR Abdomen:  Positive bowel sounds, no rebound no guarding Extremities:  No edema  LABS: Lab Results  Component Value Date   CKTOTAL 71 07/03/2012   CKMB 1.7 07/03/2012   TROPONINI <0.30 07/03/2012   Results for orders placed during the hospital encounter of 07/02/12 (from the past 24 hour(s))  MRSA PCR SCREENING     Status: Normal   Collection Time   07/02/12  7:49 PM      Component Value Range   MRSA by PCR NEGATIVE  NEGATIVE  CARDIAC PANEL(CRET KIN+CKTOT+MB+TROPI)     Status: Normal   Collection Time   07/02/12  9:23 PM      Component Value Range   Total CK 77  7 - 232 U/L   CK, MB 1.8  0.3 - 4.0 ng/mL   Troponin I <0.30  <0.30 ng/mL   Relative Index RELATIVE INDEX IS INVALID  0.0 - 2.5  HEMOGLOBIN A1C     Status: Abnormal   Collection Time   07/02/12  9:23 PM      Component Value Range   Hemoglobin A1C 7.6 (*) <5.7 %   Mean Plasma Glucose 171 (*) <117 mg/dL  TSH     Status: Normal   Collection Time   07/02/12  9:23 PM      Component Value Range   TSH 1.495  0.350 - 4.500 uIU/mL  CARDIAC PANEL(CRET KIN+CKTOT+MB+TROPI)     Status: Normal   Collection Time   07/03/12  3:18 AM      Component Value Range   Total CK 71  7 - 232 U/L   CK, MB 1.7  0.3 - 4.0 ng/mL   Troponin I <0.30  <0.30 ng/mL   Relative Index RELATIVE INDEX IS INVALID  0.0 - 2.5  BASIC METABOLIC PANEL     Status: Abnormal   Collection Time   07/03/12  3:20 AM      Component Value Range   Sodium 139  135 - 145 mEq/L   Potassium 3.6  3.5 - 5.1 mEq/L   Chloride 104  96 - 112 mEq/L   CO2 23   19 - 32 mEq/L   Glucose, Bld 182 (*) 70 - 99 mg/dL   BUN 12  6 - 23 mg/dL   Creatinine, Ser 1.03  0.50 - 1.35 mg/dL   Calcium 8.6  8.4 - 10.5 mg/dL   GFR calc non Af Amer 65 (*) >90 mL/min   GFR calc Af Amer 75 (*) >90 mL/min  HEPARIN LEVEL (UNFRACTIONATED)     Status: Normal   Collection Time   07/03/12  3:20 AM      Component Value Range   Heparin Unfractionated 0.56  0.30 - 0.70 IU/mL  CBC     Status: Normal   Collection Time   07/03/12  3:20 AM      Component Value Range   WBC 7.6  4.0 -   10.5 K/uL   RBC 4.23  4.22 - 5.81 MIL/uL   Hemoglobin 13.7  13.0 - 17.0 g/dL   HCT 39.1  39.0 - 52.0 %   MCV 92.4  78.0 - 100.0 fL   MCH 32.4  26.0 - 34.0 pg   MCHC 35.0  30.0 - 36.0 g/dL   RDW 13.3  11.5 - 15.5 %   Platelets 157  150 - 400 K/uL  LIPID PANEL     Status: Abnormal   Collection Time   07/03/12  3:20 AM      Component Value Range   Cholesterol 116  0 - 200 mg/dL   Triglycerides 82  <150 mg/dL   HDL 36 (*) >39 mg/dL   Total CHOL/HDL Ratio 3.2     VLDL 16  0 - 40 mg/dL   LDL Cholesterol 64  0 - 99 mg/dL    Intake/Output Summary (Last 24 hours) at 07/03/12 0827 Last data filed at 07/03/12 0720  Gross per 24 hour  Intake 928.28 ml  Output    276 ml  Net 652.28 ml    EKG:  Sinus bradycardia rate 52. Early repolarization pattern.  07/03/2012  ASSESSMENT AND PLAN:  Chest pain:  Ruled out.  Plan cath in am.  The patient understands that risks included but are not limited to stroke (1 in 1000), death (1 in 1000), kidney failure [usually temporary] (1 in 500), bleeding (1 in 200), allergic reaction [possibly serious] (1 in 200).  The patient understands and agrees to proceed.   HTN:  BP is somewhat labile.  Continue current therapy.    Hyperlipidemia:  LDL 64, HDL 36.  Continue current therapy.    Zaleigh Bermingham 07/03/2012 8:27 AM   

## 2012-07-04 ENCOUNTER — Encounter (HOSPITAL_COMMUNITY)
Admission: AD | Disposition: A | Discharge status: Home Health | Payer: Self-pay | Source: Other Acute Inpatient Hospital | Attending: Internal Medicine

## 2012-07-04 ENCOUNTER — Other Ambulatory Visit: Payer: Self-pay

## 2012-07-04 ENCOUNTER — Ambulatory Visit (HOSPITAL_COMMUNITY): Admit: 2012-07-04 | Payer: Self-pay | Admitting: Cardiovascular Disease

## 2012-07-04 ENCOUNTER — Encounter (HOSPITAL_COMMUNITY): Payer: Self-pay | Admitting: Physician Assistant

## 2012-07-04 DIAGNOSIS — I2510 Atherosclerotic heart disease of native coronary artery without angina pectoris: Secondary | ICD-10-CM

## 2012-07-04 HISTORY — PX: LEFT HEART CATHETERIZATION WITH CORONARY ANGIOGRAM: SHX5451

## 2012-07-04 LAB — CBC
MCH: 32.2 pg (ref 26.0–34.0)
MCHC: 35.1 g/dL (ref 30.0–36.0)
Platelets: 166 10*3/uL (ref 150–400)
RBC: 4.41 MIL/uL (ref 4.22–5.81)

## 2012-07-04 LAB — HEPARIN LEVEL (UNFRACTIONATED): Heparin Unfractionated: 1.14 IU/mL — ABNORMAL HIGH (ref 0.30–0.70)

## 2012-07-04 SURGERY — LEFT HEART CATHETERIZATION WITH CORONARY ANGIOGRAM
Anesthesia: LOCAL

## 2012-07-04 MED ORDER — LIDOCAINE HCL (PF) 1 % IJ SOLN
INTRAMUSCULAR | Status: AC
  Filled 2012-07-04: qty 30

## 2012-07-04 MED ORDER — ASPIRIN 81 MG PO TBEC: 81.0000 mg | DELAYED_RELEASE_TABLET | Freq: Every day | ORAL | Status: DC

## 2012-07-04 MED ORDER — HEPARIN (PORCINE) IN NACL 2-0.9 UNIT/ML-% IJ SOLN
INTRAMUSCULAR | Status: AC
  Filled 2012-07-04: qty 2000

## 2012-07-04 MED ORDER — HEPARIN (PORCINE) IN NACL 100-0.45 UNIT/ML-% IJ SOLN
750.0000 [IU]/h | INTRAMUSCULAR | Status: DC
  Filled 2012-07-04: qty 250

## 2012-07-04 MED ORDER — SODIUM CHLORIDE 0.9 % IV SOLN: INTRAVENOUS | Status: DC

## 2012-07-04 MED ORDER — SODIUM CHLORIDE 0.9 % IJ SOLN: 3.0000 mL | INTRAMUSCULAR | Status: DC | PRN

## 2012-07-04 MED ORDER — MAGNESIUM HYDROXIDE 400 MG/5ML PO SUSP
30.0000 mL | Freq: Every day | ORAL | Status: DC | PRN
  Administered 2012-07-05: 30 mL via ORAL
  Filled 2012-07-04 (×2): qty 30

## 2012-07-04 MED ORDER — AMLODIPINE BESYLATE 10 MG PO TABS: 10.0000 mg | ORAL_TABLET | Freq: Every day | ORAL | Status: DC

## 2012-07-04 MED ORDER — HYDRALAZINE HCL 20 MG/ML IJ SOLN
INTRAMUSCULAR | Status: AC
  Filled 2012-07-04: qty 1

## 2012-07-04 MED ORDER — MIDAZOLAM HCL 2 MG/2ML IJ SOLN
INTRAMUSCULAR | Status: AC
  Filled 2012-07-04: qty 2

## 2012-07-04 MED ORDER — SODIUM CHLORIDE 0.9 % IV SOLN
INTRAVENOUS | Status: AC
  Administered 2012-07-04: 11:00:00 via INTRAVENOUS

## 2012-07-04 MED ORDER — SODIUM CHLORIDE 0.9 % IV SOLN: 250.0000 mL | INTRAVENOUS | Status: DC | PRN

## 2012-07-04 MED ORDER — NITROGLYCERIN 0.2 MG/ML ON CALL CATH LAB
INTRAVENOUS | Status: AC
  Filled 2012-07-04: qty 1

## 2012-07-04 MED ORDER — SODIUM CHLORIDE 0.9 % IJ SOLN: 3.0000 mL | Freq: Two times a day (BID) | INTRAMUSCULAR | Status: DC

## 2012-07-04 MED FILL — Nitroglycerin IV Soln 200 MCG/ML in D5W: INTRAVENOUS | Qty: 250 | Status: AC

## 2012-07-04 NOTE — H&P (View-Only) (Signed)
SUBJECTIVE:  Mild chest pain off an on through the night.     PHYSICAL EXAM Filed Vitals:   07/02/12 2200 07/02/12 2300 07/03/12 0357 07/03/12 0747  BP: 151/73 122/75 131/75 144/69  Pulse: 68 57 50 49  Temp:  98.1 F (36.7 C) 98.1 F (36.7 C) 98 F (36.7 C)  TempSrc:  Oral Oral Oral  Resp: 17 17 14 15   Height:      Weight:      SpO2: 98% 98% 99% 97%   General:  No distress Lungs:  Few basilar crackles Heart:  RRR Abdomen:  Positive bowel sounds, no rebound no guarding Extremities:  No edema  LABS: Lab Results  Component Value Date   CKTOTAL 71 07/03/2012   CKMB 1.7 07/03/2012   TROPONINI <0.30 07/03/2012   Results for orders placed during the hospital encounter of 07/02/12 (from the past 24 hour(s))  MRSA PCR SCREENING     Status: Normal   Collection Time   07/02/12  7:49 PM      Component Value Range   MRSA by PCR NEGATIVE  NEGATIVE  CARDIAC PANEL(CRET KIN+CKTOT+MB+TROPI)     Status: Normal   Collection Time   07/02/12  9:23 PM      Component Value Range   Total CK 77  7 - 232 U/L   CK, MB 1.8  0.3 - 4.0 ng/mL   Troponin I <0.30  <0.30 ng/mL   Relative Index RELATIVE INDEX IS INVALID  0.0 - 2.5  HEMOGLOBIN A1C     Status: Abnormal   Collection Time   07/02/12  9:23 PM      Component Value Range   Hemoglobin A1C 7.6 (*) <5.7 %   Mean Plasma Glucose 171 (*) <117 mg/dL  TSH     Status: Normal   Collection Time   07/02/12  9:23 PM      Component Value Range   TSH 1.495  0.350 - 4.500 uIU/mL  CARDIAC PANEL(CRET KIN+CKTOT+MB+TROPI)     Status: Normal   Collection Time   07/03/12  3:18 AM      Component Value Range   Total CK 71  7 - 232 U/L   CK, MB 1.7  0.3 - 4.0 ng/mL   Troponin I <0.30  <0.30 ng/mL   Relative Index RELATIVE INDEX IS INVALID  0.0 - 2.5  BASIC METABOLIC PANEL     Status: Abnormal   Collection Time   07/03/12  3:20 AM      Component Value Range   Sodium 139  135 - 145 mEq/L   Potassium 3.6  3.5 - 5.1 mEq/L   Chloride 104  96 - 112 mEq/L   CO2 23   19 - 32 mEq/L   Glucose, Bld 182 (*) 70 - 99 mg/dL   BUN 12  6 - 23 mg/dL   Creatinine, Ser 1.61  0.50 - 1.35 mg/dL   Calcium 8.6  8.4 - 09.6 mg/dL   GFR calc non Af Amer 65 (*) >90 mL/min   GFR calc Af Amer 75 (*) >90 mL/min  HEPARIN LEVEL (UNFRACTIONATED)     Status: Normal   Collection Time   07/03/12  3:20 AM      Component Value Range   Heparin Unfractionated 0.56  0.30 - 0.70 IU/mL  CBC     Status: Normal   Collection Time   07/03/12  3:20 AM      Component Value Range   WBC 7.6  4.0 -  10.5 K/uL   RBC 4.23  4.22 - 5.81 MIL/uL   Hemoglobin 13.7  13.0 - 17.0 g/dL   HCT 91.4  78.2 - 95.6 %   MCV 92.4  78.0 - 100.0 fL   MCH 32.4  26.0 - 34.0 pg   MCHC 35.0  30.0 - 36.0 g/dL   RDW 21.3  08.6 - 57.8 %   Platelets 157  150 - 400 K/uL  LIPID PANEL     Status: Abnormal   Collection Time   07/03/12  3:20 AM      Component Value Range   Cholesterol 116  0 - 200 mg/dL   Triglycerides 82  <469 mg/dL   HDL 36 (*) >62 mg/dL   Total CHOL/HDL Ratio 3.2     VLDL 16  0 - 40 mg/dL   LDL Cholesterol 64  0 - 99 mg/dL    Intake/Output Summary (Last 24 hours) at 07/03/12 0827 Last data filed at 07/03/12 0720  Gross per 24 hour  Intake 928.28 ml  Output    276 ml  Net 652.28 ml    EKG:  Sinus bradycardia rate 52. Early repolarization pattern.  07/03/2012  ASSESSMENT AND PLAN:  Chest pain:  Ruled out.  Plan cath in am.  The patient understands that risks included but are not limited to stroke (1 in 1000), death (1 in 1000), kidney failure [usually temporary] (1 in 500), bleeding (1 in 200), allergic reaction [possibly serious] (1 in 200).  The patient understands and agrees to proceed.   HTN:  BP is somewhat labile.  Continue current therapy.    Hyperlipidemia:  LDL 64, HDL 36.  Continue current therapy.    Fayrene Fearing [facility] 07/03/2012 8:27 AM

## 2012-07-04 NOTE — Progress Notes (Signed)
ANTICOAGULATION CONSULT NOTE - Follow Up Consult  Pharmacy Consult for heparin Indication: chest pain/ACS  Labs:  Basename 07/04/12 0414 07/03/12 0320 07/03/12 0318 07/02/12 2123  HGB 14.2 13.7 -- --  HCT 40.4 39.1 -- --  PLT 166 157 -- --  APTT -- -- -- --  LABPROT -- -- -- --  INR -- -- -- --  HEPARINUNFRC 1.14* 0.56 -- --  CREATININE -- 1.03 -- --  CKTOTAL -- -- 71 77  CKMB -- -- 1.7 1.8  TROPONINI -- -- <0.30 <0.30    Assessment: 78yo male now supratherapeutic on heparin after one level at goal.  Cath scheduled this afternoon.  Goal of Therapy:  Heparin level 0.3-0.7 units/ml   Plan:  Will hold heparin gtt x29min then decrease gtt by 3 units/kg/hr to 750 units/hr and f/u after cath.  Colleen Can PharmD BCPS 07/04/2012,4:58 AM

## 2012-07-04 NOTE — Discharge Summary (Addendum)
Discharge Summary   Original note drafted by Ronie Spies, PA-C on 07/04/12. The patient remained hospitalized until 07/07/12. The appropriate changes have been made to reflect the hospital course.   Patient ID: Kenneth Potter MRN: [id number], DOB/AGE: 1926/05/06 78 y.o. Admit date: 07/02/2012 D/C date:     07/07/2012  Primary Cardiologist: Jonita Albee (previously saw Dr. Kirke Corin)  Primary Discharge Diagnoses:  1. Chest pain, noncardiac - ruled out for MI - cardiac cath nonobstructive dz 07/04/12 2. HTN 3. Hyperglycemia with probable newly diagnosed diabetes mellitus 4. HL 5. Presyncope  Secondary Discharge Diagnoses:  1. Prior h/o presyncope 2. Known sinus bradycardia 3. H/o prostate CA  Hospital Course:  78 y/o M with hx of no known CAD. He had a cardiac catheterization in 2006 was negative for significant CAD. He had a negative, inadequate (69% PMHR) exercise stress Cardiolite, 2/12, reviewed by Dr. Andee Lineman, which was negative for definite ischemia; EF 61%. A 2-D echo at that time indicated normal LVF (EF 60-65%), with diastolic dysfunction, and mild MR and mild TR. He was also seen previously for bradycardia by Dr. Kirke Corin in Feb 2013, with the lowest HR noted at 42bpm without evidence of high grade AV block. He presented to the [facility] office 07/02/12 with complaints of intermittent chest pain, sometimes exertional but also occurring when lying in a certain position. His symptoms resolved with rest. EKG showed nonspecific changes but consistent with prior. His symptoms were felt concerning for [place] so he was transferred to [facility] for plans for catheterization. His medications were titrated due to high blood pressure. CE's remained negative. He was not hypoxic, tachycardic, or tachypnic. Cath 07/04/12 demonstrated: Angiographic Findings:  Left main: No obstructive disease noted.  Left Anterior Descending Artery: Large vessel that courses to the apex. Several small to moderate sized diagonal  branches. Mild luminal irregularities in the LAD.  Circumflex Artery: Large caliber vessel with moderate sized early marginal branch and moderate sized bifurcating second diagonal branch. There are minor luminal irregularities.  Right Coronary Artery: Large, dominant vessel with 40% mid stenosis.  Left Ventricular Angiogram: LVEF 65-70%.  Dr. Clifton James felt his chest pain was noncardiac. He recommended continued medical management. He is to follow-up in our [place] office in several weeks. Note that BP's are better controlled today, in the range of 115-130s. Note he is on amlodipine and simvastatin but dose of zocor is 20mg  daily and most recent LFTs were normal. He also has documentation of aspirin allergy in his chart but this is with high dose - he has tolerated baby ASA in the past. Post-cath, the patient had an episode of chest pain. An EKG was performed revealing no evidence of ischemia. He was examined overnight by Dr. Antoine Poche. Upon standing, the patient became weak and very briefly unresponsive, consistent with presyncope. There was no fall or LOC. A subsequent episode occurred on standing later that day. He was hydrated with IVF with improvement. This was felt to be vagal-mediated. The patient has ambulated in the hall numerous times without incident. He was assessed by Dr. Patty Sermons this morning, and felt to be stable for discharge. He will resume his prior outpatient medications. BB will not be added given history of heart block and bradycardia. This was not prescribed while inpatient. PPI and metformin will be provided at discharge. He will follow-up in the [facility] office and his PCP as noted below. This information was clearly outlined in the discharge AVS.   Of note, A1C was also checked this  admission indicating probable newly diagnosed diabetes mellitus. The patient was instructed to follow up with his primary doctor in the upcoming week to discuss further evaluation/management. This finding was  discussed with him and he expressed understanding.  Discharge Vitals: Blood pressure 144/78, pulse 60, temperature 97.9 F (36.6 C), temperature source Oral, resp. rate 18, height 5\' 4"  (1.626 m), weight 70.2 kg (154 lb 12.2 oz), SpO2 95.00%.  Labs: Lab Results  Component Value Date   WBC 6.6 07/07/2012   HGB 13.7 07/07/2012   HCT 38.8* 07/07/2012   MCV 91.5 07/07/2012   PLT 147* 07/07/2012     Lab 07/07/12 0624  NA 136  K 4.0  CL 105  CO2 21  BUN 17  CREATININE 1.11  CALCIUM 8.7  PROT --  BILITOT --  ALKPHOS --  ALT --  AST --  GLUCOSE 122*   No results found for this basename: CKTOTAL:4,CKMB:4,TROPONINI:4 in the last 72 hours Lab Results  Component Value Date   CHOL 116 07/03/2012   HDL 36* 07/03/2012   LDLCALC 64 07/03/2012   TRIG 82 07/03/2012    Diagnostic Studies/Procedures   1. Cardiac catheterization this admission, please see full report and above for summary.  Discharge Medications   Medication List  As of 07/07/2012 12:47 PM   TAKE these medications         amLODipine 10 MG tablet   Commonly known as: NORVASC   Take 1 tablet (10 mg total) by mouth daily.      aspirin 81 MG EC tablet   Take 1 tablet (81 mg total) by mouth daily.      lisinopril 20 MG tablet   Commonly known as: PRINIVIL,ZESTRIL   Take 20 mg by mouth 2 (two) times daily.      metFORMIN 500 MG tablet   Commonly known as: GLUCOPHAGE   Take 1 tablet (500 mg total) by mouth 2 (two) times daily with a meal.      pantoprazole 40 MG tablet   Commonly known as: PROTONIX   Take 1 tablet (40 mg total) by mouth daily at 6 (six) AM.      simvastatin 20 MG tablet   Commonly known as: ZOCOR   Take 20 mg by mouth every evening.      SYSTANE FREE OP   Place 1 drop into both eyes daily as needed. For dry eyes            Disposition   The patient will be discharged in stable condition to home. Discharge Orders    Future Appointments: Provider: Department: Dept Phone: Center:   07/25/2012 1:20 PM  [place] Parma, PA [facility] Maryruth Bun [phone] [facility]     Future Orders Please Complete By Expires   Diet - low sodium heart healthy      Increase activity slowly      Comments:   No driving for 2 days. No lifting over 5 lbs for 1 week. No sexual activity for 1 week. Keep procedure site clean & dry. If you notice increased pain, swelling, bleeding or pus, call/return!  You may shower, but no soaking baths/hot tubs/pools for 1 week.     Follow-up Information    Follow up with SERPE, EUGENE, PA. (Friday 07/25/12 at 1:20pm)    Contact information:   [address] [place] [address] [phone]       Follow up with [facility], MD in 1 week. (F/U )    Contact information:  [address]  [place] [address] [phone]            Duration of Discharge Encounter: Greater than 30 minutes including physician and PA time.  Signed, Shaune Spittle, Dakoda Laventure PA-C 07/07/2012, 12:47 PM  Add:  R. Hurman Horn, PA-C 07/07/2012 12:47 PM

## 2012-07-04 NOTE — Interval H&P Note (Signed)
History and Physical Interval Note:  07/04/2012 7:39 AM  Kenneth Potter  has presented today for surgery, with the diagnosis of Chest pain  The various methods of treatment have been discussed with the patient and family. After consideration of risks, benefits and other options for treatment, the patient has consented to  Procedure(s) (LRB): LEFT HEART CATHETERIZATION WITH CORONARY ANGIOGRAM (N/A) as a surgical intervention .  The patient's history has been reviewed, patient examined, no change in status, stable for surgery.  I have reviewed the patients' chart and labs.  Questions were answered to the patient's satisfaction.     [name],[name]

## 2012-07-04 NOTE — CV Procedure (Signed)
   Cardiac Catheterization Operative Report  Kenneth Potter [id number] 7/5/20138:19 AM Kirstie Peri, MD  Procedure Performed:  1. Left Heart Catheterization 2. Selective Coronary Angiography 3. Left ventricular angiogram  Operator: Verne Carrow, MD  Indication:  Chest pain, known moderate CAD, negative cardiac enzymes.                                      Procedure Details: The risks, benefits, complications, treatment options, and expected outcomes were discussed with the patient. The patient and/or family concurred with the proposed plan, giving informed consent. The patient was brought to the cath lab after IV hydration was begun and oral premedication was given. The patient was further sedated with Versed. Allens test was positive on the right wrist. The right wrist was prepped and draped and access was obtained with a 5 [place] sheath. The patient was found to have a very tortuous anatomy and we could not engage the ascending aorta from the wrist. The right groin was prepped and draped in the usual manner. Using the modified Seldinger access technique, a 5 French sheath was placed in the right femoral artery. Standard diagnostic catheters were used to perform selective coronary angiography.  A JL5 was used to engage the left main. A pigtail catheter was used to perform a left ventricular angiogram.  There were no immediate complications. The patient was taken to the recovery area in stable condition.   Hemodynamic Findings: Central aortic pressure: 172/77 Left ventricular pressure: 165/1/8  Angiographic Findings:  Left main: No obstructive disease noted.   Left Anterior Descending Artery: Large vessel that courses to the apex. Several small to moderate sized diagonal branches. Mild luminal irregularities in the LAD.   Circumflex Artery: Large caliber vessel with moderate sized early marginal branch and moderate sized bifurcating second diagonal branch. There are minor luminal  irregularities.   Right Coronary Artery: Large, dominant vessel with 40% mid stenosis.   Left Ventricular Angiogram: LVEF 65-70%.   Impression: 1. Single vessel CAD, stable.  2. Normal LV systolic function.  3. Non-cardiac chest pain  Recommendations: Will continue medical management. Will d/c home today after bedrest. Follow up in [place] office in 3-4 weeks.        Complications:  None. The patient tolerated the procedure well.

## 2012-07-05 DIAGNOSIS — E782 Mixed hyperlipidemia: Secondary | ICD-10-CM | POA: Diagnosis present

## 2012-07-05 DIAGNOSIS — R079 Chest pain, unspecified: Secondary | ICD-10-CM

## 2012-07-05 DIAGNOSIS — I10 Essential (primary) hypertension: Secondary | ICD-10-CM | POA: Diagnosis present

## 2012-07-05 DIAGNOSIS — I2510 Atherosclerotic heart disease of native coronary artery without angina pectoris: Secondary | ICD-10-CM

## 2012-07-05 DIAGNOSIS — R0789 Other chest pain: Secondary | ICD-10-CM

## 2012-07-05 LAB — BASIC METABOLIC PANEL
BUN: 13 mg/dL (ref 6–23)
Chloride: 107 mEq/L (ref 96–112)
GFR calc Af Amer: 66 mL/min — ABNORMAL LOW (ref >90)
GFR calc non Af Amer: 57 mL/min — ABNORMAL LOW (ref >90)
Potassium: 3.8 mEq/L (ref 3.5–5.1)
Sodium: 137 mEq/L (ref 135–145)

## 2012-07-05 LAB — CBC
HCT: 41.4 % (ref 39.0–52.0)
MCHC: 36 g/dL (ref 30.0–36.0)
Platelets: 150 10*3/uL (ref 150–400)
RDW: 13.3 % (ref 11.5–15.5)
WBC: 7.3 10*3/uL (ref 4.0–10.5)

## 2012-07-05 LAB — GLUCOSE, CAPILLARY
Glucose-Capillary: 130 mg/dL — ABNORMAL HIGH (ref 70–99)
Glucose-Capillary: 138 mg/dL — ABNORMAL HIGH (ref 70–99)
Glucose-Capillary: 154 mg/dL — ABNORMAL HIGH (ref 70–99)

## 2012-07-05 MED ORDER — PANTOPRAZOLE SODIUM 40 MG PO TBEC
40.0000 mg | DELAYED_RELEASE_TABLET | Freq: Every day | ORAL | Status: DC
  Administered 2012-07-05 – 2012-07-07 (×3): 40 mg via ORAL
  Filled 2012-07-05 (×2): qty 1

## 2012-07-05 MED ORDER — INSULIN ASPART 100 UNIT/ML ~~LOC~~ SOLN
0.0000 [IU] | Freq: Three times a day (TID) | SUBCUTANEOUS | Status: DC
  Administered 2012-07-05: 1 [IU] via SUBCUTANEOUS
  Administered 2012-07-06: 2 [IU] via SUBCUTANEOUS
  Administered 2012-07-06: 1 [IU] via SUBCUTANEOUS

## 2012-07-05 MED ORDER — SODIUM CHLORIDE 0.9 % IV SOLN
INTRAVENOUS | Status: AC
  Administered 2012-07-05: 100 mL via INTRAVENOUS

## 2012-07-05 NOTE — Progress Notes (Addendum)
Wife at the bedside and called, pt. having episode of chest pain, 5/10 in the left chest non radiating, pt. looks pale, vitals signs taken SBP from 119 down to the 90's, HR from 70's to 50's and 60's. 12 lead EKG done no changes noted  per  RRT, pt. Stated pain is gone, given a bolus of fluids and SBP  when up to 140's-160's. repositioned pt., rt. Groin assessed remains level 1, Dr. Terressa Koyanagi made aware of pts. symptoms,  made aware also of no BM for several days, cardiology PRN order activated. Pt. comfortable at this present, will cont. to monitor

## 2012-07-05 NOTE — Progress Notes (Signed)
Patient Name: Kenneth Potter Date of Encounter: 07/05/2012   Principal Problem:  *Midsternal chest pain Active Problems:  CAD (coronary artery disease)  Hyperglycemia  Mixed hyperlipidemia  Essential hypertension, benign    SUBJECTIVE  Episode of chest pain last night.  Pt says it's "like a gas pain."  Pressure dropped into 90's briefly - responded to fluid bolus.  No further chest pain or BP variation.  Ambulating this AM.  Wrist/cath site feels good.  CURRENT MEDS    . amLODipine  10 mg Oral Daily  . aspirin EC  81 mg Oral Daily  . heparin      . lidocaine      . lisinopril  20 mg Oral BID  . midazolam      . nitroGLYCERIN      . simvastatin  20 mg Oral q1800  . sodium chloride  3 mL Intravenous Q12H   OBJECTIVE  Filed Vitals:   07/05/12 0200 07/05/12 0400 07/05/12 0500 07/05/12 0600  BP: 124/67 120/72 130/74 128/70  Pulse: 71 68 56 59  Temp:  98.1 F (36.7 C)    TempSrc:  Oral    Resp: 18 16    Height:      Weight:      SpO2: 99% 99% 97% 98%    Intake/Output Summary (Last 24 hours) at 07/05/12 0734 Last data filed at 07/05/12 0200  Gross per 24 hour  Intake    540 ml  Output   1150 ml  Net   -610 ml   Filed Weights   07/02/12 1956 07/04/12 0113  Weight: 147 lb 4.3 oz (66.8 kg) 154 lb 12.2 oz (70.2 kg)    PHYSICAL EXAM  General: Pleasant, NAD. Neuro: Alert and oriented X 3. Moves all extremities spontaneously. Psych: Flat affect. HEENT:  Normal  Neck: Supple without bruits or JVD. Lungs:  Resp regular and unlabored, diminished @ bases, otw CTA. Heart: RRR no s3, s4, or murmurs. Abdomen: Soft, non-tender, non-distended, BS + x 4.  Extremities: No clubbing, cyanosis or edema. DP/PT/Radials 2+ and equal bilaterally.  R radial and R femoral cath sites w/o bleeding, bruit.  Resolving hematoma of R groin.  Accessory Clinical Findings  CBC  Basename 07/05/12 0355 07/04/12 0414  WBC 7.3 7.2  NEUTROABS -- --  HGB 14.9 14.2  HCT 41.4 40.4  MCV  91.8 91.6  PLT 150 166   Basic Metabolic Panel  Basename 07/05/12 0355 07/03/12 0320  NA 137 139  K 3.8 3.6  CL 107 104  CO2 21 23  GLUCOSE 140* 182*  BUN 13 12  CREATININE 1.15 1.03  CALCIUM 9.0 8.6  MG -- --  PHOS -- --   Cardiac Enzymes  Basename 07/03/12 0318 07/02/12 2123  CKTOTAL 71 77  CKMB 1.7 1.8  CKMBINDEX -- --  TROPONINI <0.30 <0.30   Hemoglobin A1C  Basename 07/02/12 2123  HGBA1C 7.6*   Fasting Lipid Panel  Basename 07/03/12 0320  CHOL 116  HDL 36*  LDLCALC 64  TRIG 82  CHOLHDL 3.2  LDLDIRECT --   Thyroid Function Tests  Basename 07/02/12 2123  TSH 1.495  T4TOTAL --  T3FREE --  THYROIDAB --    TELE  SB/RSR, pac's w/ brief runs of PAT.  Occas pvc.  Radiology/Studies  No results found.  ASSESSMENT AND PLAN  1.  Midsternal Chest Pain:  S/p cath revealing nonobstructive dzs.  CE negative.  Therefor doubt cardiac source of pain.  Add PPI.  F/U  PCP.  2.  HTN:  Stable.  3.  HL:  LDL 64.  Cont statin Rx.  4.  Hyperglycemia/DMII:  A1C 7.5.  Fasting glucoses elevated.  Will provide Rx for Metformin 500mg  BID to start in 48 hrs (post cath) and rec f/u with Dr. Sherryll Burger in [place].  Signed, Nicolasa Ducking NP  History and all data above reviewed.  Patient examined.  I agree with the findings as above.  The patient exam reveals COR: RRR  ,  Lungs: Clear  ,  Abd: Positive bowel sounds, no rebound no guarding, Ext Right groin with slight hematoma  .  All available labs, radiology testing, previous records reviewed. Agree with documented assessment and plan. As I was examining him I stood him up.  He became weak and very briefly unresponsive.  He had no loss of consciousness, pulse or BP.  He will need to be watched overnight.  No discharge.    Fayrene Fearing Jaqueline Uber  8:12 AM  07/05/2012

## 2012-07-06 LAB — GLUCOSE, CAPILLARY
Glucose-Capillary: 126 mg/dL — ABNORMAL HIGH (ref 70–99)
Glucose-Capillary: 136 mg/dL — ABNORMAL HIGH (ref 70–99)
Glucose-Capillary: 147 mg/dL — ABNORMAL HIGH (ref 70–99)

## 2012-07-06 LAB — CBC
Hemoglobin: 14.6 g/dL (ref 13.0–17.0)
MCH: 32.4 pg (ref 26.0–34.0)
MCHC: 35.1 g/dL (ref 30.0–36.0)
MCV: 92.4 fL (ref 78.0–100.0)
RBC: 4.5 MIL/uL (ref 4.22–5.81)

## 2012-07-06 NOTE — Progress Notes (Signed)
SUBJECTIVE:  He had an episode very brief LOC yesterday early in the am as we were thinking about discharging him.  He had another episode at 12N the same.  Last night there was chest pain with some hypotension.  He received IV fluid twice.  He says he feels great this morning and he wants to go home. He has not been ambulating since these events.   PHYSICAL EXAM Filed Vitals:   07/05/12 0800 07/05/12 1325 07/05/12 2011 07/06/12 0532  BP: 125/69 139/71 149/82 148/72  Pulse: 78 56 65 59  Temp: 98.2 F (36.8 C) 98.2 F (36.8 C) 98.4 F (36.9 C) 97.8 F (36.6 C)  TempSrc: Oral Oral Oral Oral  Resp: 18 18 19 17   Height:      Weight:      SpO2: 96% 92% 92% 95%   General:  No distress Lungs:  Few basilar crackles Heart:  RRR Abdomen:  Positive bowel sounds, no rebound no guarding Extremities:  No edema  LABS:  Results for orders placed during the hospital encounter of 07/02/12 (from the past 24 hour(s))  GLUCOSE, CAPILLARY     Status: Abnormal   Collection Time   07/05/12 11:23 AM      Component Value Range   Glucose-Capillary 138 (*) 70 - 99 mg/dL  GLUCOSE, CAPILLARY     Status: Abnormal   Collection Time   07/05/12  4:14 PM      Component Value Range   Glucose-Capillary 159 (*) 70 - 99 mg/dL  GLUCOSE, CAPILLARY     Status: Abnormal   Collection Time   07/05/12  9:12 PM      Component Value Range   Glucose-Capillary 154 (*) 70 - 99 mg/dL   Comment 1 Documented in Chart     Comment 2 Notify RN    CBC     Status: Normal   Collection Time   07/06/12  6:30 AM      Component Value Range   WBC 7.2  4.0 - 10.5 K/uL   RBC 4.50  4.22 - 5.81 MIL/uL   Hemoglobin 14.6  13.0 - 17.0 g/dL   HCT 40.9  81.1 - 91.4 %   MCV 92.4  78.0 - 100.0 fL   MCH 32.4  26.0 - 34.0 pg   MCHC 35.1  30.0 - 36.0 g/dL   RDW 78.2  95.6 - 21.3 %   Platelets 165  150 - 400 K/uL  GLUCOSE, CAPILLARY     Status: Abnormal   Collection Time   07/06/12  6:32 AM      Component Value Range   Glucose-Capillary  126 (*) 70 - 99 mg/dL   Comment 1 Documented in Chart     Comment 2 Notify RN      Intake/Output Summary (Last 24 hours) at 07/06/12 0907 Last data filed at 07/06/12 0839  Gross per 24 hour  Intake    240 ml  Output    575 ml  Net   -335 ml   ASSESSMENT AND PLAN:  Chest pain:  Nonobstructive CAD.  On PPI.  Follow with PCP  Hyperlipidemia:  LDL 64, HDL 36.  Continue current therapy.   Diabetes:  New diagnosis.  We started metformin and educated the patient yesterday.  Presyncope:  This occurred yesterday with LOC for a second or two.  I suspect a vagal episode.  No objective findings and no further events.  Given the chest pain and hypotension yesterday he needs to  stay one more day and ambulate to make sure he has no further episodes.    Fayrene Fearing [facility] 07/06/2012 9:07 AM

## 2012-07-07 LAB — BASIC METABOLIC PANEL
CO2: 21 mEq/L (ref 19–32)
Calcium: 8.7 mg/dL (ref 8.4–10.5)
Creatinine, Ser: 1.11 mg/dL (ref 0.50–1.35)
Glucose, Bld: 122 mg/dL — ABNORMAL HIGH (ref 70–99)

## 2012-07-07 LAB — CBC
HCT: 38.8 % — ABNORMAL LOW (ref 39.0–52.0)
MCH: 32.3 pg (ref 26.0–34.0)
MCV: 91.5 fL (ref 78.0–100.0)
WBC: 6.6 10*3/uL (ref 4.0–10.5)

## 2012-07-07 LAB — GLUCOSE, CAPILLARY: Glucose-Capillary: 123 mg/dL — ABNORMAL HIGH (ref 70–99)

## 2012-07-07 MED ORDER — METFORMIN HCL 500 MG PO TABS: 500.0000 mg | ORAL_TABLET | Freq: Two times a day (BID) | ORAL | Status: DC

## 2012-07-07 MED ORDER — PANTOPRAZOLE SODIUM 40 MG PO TBEC: 40.0000 mg | DELAYED_RELEASE_TABLET | Freq: Every day | ORAL | Status: DC

## 2012-07-07 NOTE — Progress Notes (Signed)
Pt discharge to wife and son.  All belongings have been returned to pt.  Discharge instructions have been thoroughly reviewed with pt and family.  Pt alert and oriented and in good spirits.

## 2012-07-07 NOTE — Progress Notes (Signed)
   Subjective:  The patient feels well today. Walked in hall 3x yesterday without any trouble.  Rhythm stable. Groin stable.  BP satisfactory on current meds.  No chest pain.  Objective:  Vital Signs in the last 24 hours: Temp:  [98.2 F (36.8 C)-98.5 F (36.9 C)] 98.5 F (36.9 C) (07/08 0414) Pulse Rate:  [57-66] 57  (07/08 0414) Resp:  [18-19] 19  (07/08 0414) BP: (124-151)/(67-84) 124/67 mmHg (07/08 0414) SpO2:  [94 %-95 %] 94 % (07/08 0414)  Intake/Output from previous day: 07/07 0701 - 07/08 0700 In: 720 [P.O.:720] Out: 1150 [Urine:1150] Intake/Output from this shift:       . amLODipine  10 mg Oral Daily  . aspirin EC  81 mg Oral Daily  . insulin aspart  0-9 Units Subcutaneous TID WC  . lisinopril  20 mg Oral BID  . pantoprazole  40 mg Oral Q0600  . simvastatin  20 mg Oral q1800  . sodium chloride  3 mL Intravenous Q12H      Physical Exam: The patient appears to be in no distress.  Head and neck exam reveals that the pupils are equal and reactive.  The extraocular movements are full.  There is no scleral icterus.  Mouth and pharynx are benign.  No lymphadenopathy.  No carotid bruits.  The jugular venous pressure is normal.  Thyroid is not enlarged or tender.  Chest is clear to percussion and auscultation.  No rales or rhonchi.  Expansion of the chest is symmetrical.  Heart reveals no abnormal lift or heave.  First and second heart sounds are normal.  There is no murmur gallop rub or click.  The abdomen is soft and nontender.  Bowel sounds are normoactive.  There is no hepatosplenomegaly or mass.  There are no abdominal bruits.The right groin is only minimally tender now. Hematoma resolving. Extremities reveal no phlebitis or edema.  Pedal pulses are good.  There is no cyanosis or clubbing.  Neurologic exam is normal strength and no lateralizing weakness.  No sensory deficits.  Integument reveals no rash  Lab Results:  Basename 07/07/12 0624 07/06/12 0630  WBC  6.6 7.2  HGB 13.7 14.6  PLT 147* 165    Basename 07/07/12 0624 07/05/12 0355  NA 136 137  K 4.0 3.8  CL 105 107  CO2 21 21  GLUCOSE 122* 140*  BUN 17 13  CREATININE 1.11 1.15   No results found for this basename: TROPONINI:2,CK,MB:2 in the last 72 hours Hepatic Function Panel No results found for this basename: PROT,ALBUMIN,AST,ALT,ALKPHOS,BILITOT,BILIDIR,IBILI in the last 72 hours No results found for this basename: CHOL in the last 72 hours No results found for this basename: PROTIME in the last 72 hours  Imaging: No results found.  Cardiac Studies:  Assessment/Plan:  Patient Active Hospital Problem List: Midsternal chest pain (07/05/2012)   Assessment: No further pain   Plan: Pain felt to be non-cardiac. CAD (coronary artery disease) (07/05/2012)   Assessment: CAD with single vessel disease, stable   Plan: Home today, follow up in [place] in 3-4 weeks  Essential hypertension, benign ()   Assessment: Stable on current meds.   Plan: Continue current meds.   LOS: 5 days    Cassell Clement 07/07/2012, 7:37 AM

## 2012-07-25 ENCOUNTER — Encounter: Payer: Medicare Other | Admitting: Physician Assistant

## 2012-11-18 ENCOUNTER — Encounter: Payer: Self-pay | Admitting: Family Medicine

## 2012-11-18 ENCOUNTER — Ambulatory Visit (INDEPENDENT_AMBULATORY_CARE_PROVIDER_SITE_OTHER): Payer: Medicare Other | Admitting: Family Medicine

## 2012-11-18 VITALS — BP 162/78 | HR 64 | Resp 18 | Ht 63.5 in | Wt 142.0 lb

## 2012-11-18 DIAGNOSIS — Z8546 Personal history of malignant neoplasm of prostate: Secondary | ICD-10-CM

## 2012-11-18 DIAGNOSIS — E782 Mixed hyperlipidemia: Secondary | ICD-10-CM

## 2012-11-18 DIAGNOSIS — I10 Essential (primary) hypertension: Secondary | ICD-10-CM

## 2012-11-18 DIAGNOSIS — E119 Type 2 diabetes mellitus without complications: Secondary | ICD-10-CM

## 2012-11-18 DIAGNOSIS — I2510 Atherosclerotic heart disease of native coronary artery without angina pectoris: Secondary | ICD-10-CM

## 2012-11-18 DIAGNOSIS — R059 Cough, unspecified: Secondary | ICD-10-CM

## 2012-11-18 DIAGNOSIS — C61 Malignant neoplasm of prostate: Secondary | ICD-10-CM

## 2012-11-18 DIAGNOSIS — R05 Cough: Secondary | ICD-10-CM

## 2012-11-18 DIAGNOSIS — E785 Hyperlipidemia, unspecified: Secondary | ICD-10-CM

## 2012-11-18 NOTE — Patient Instructions (Signed)
Continue current medication Get the labs done fasting in the morning Get an xray of your chest  F/U 2 weeks

## 2012-11-19 ENCOUNTER — Other Ambulatory Visit: Payer: Self-pay | Admitting: Family Medicine

## 2012-11-19 ENCOUNTER — Ambulatory Visit (HOSPITAL_COMMUNITY)
Admission: RE | Admit: 2012-11-19 | Discharge: 2012-11-19 | Disposition: A | Discharge status: Home / Self Care | Payer: Medicare Other | Source: Ambulatory Visit | Attending: Family Medicine | Admitting: Family Medicine

## 2012-11-19 ENCOUNTER — Other Ambulatory Visit: Payer: Self-pay

## 2012-11-19 DIAGNOSIS — E782 Mixed hyperlipidemia: Secondary | ICD-10-CM

## 2012-11-19 DIAGNOSIS — R0602 Shortness of breath: Secondary | ICD-10-CM | POA: Insufficient documentation

## 2012-11-19 DIAGNOSIS — Z125 Encounter for screening for malignant neoplasm of prostate: Secondary | ICD-10-CM

## 2012-11-19 DIAGNOSIS — R05 Cough: Secondary | ICD-10-CM | POA: Insufficient documentation

## 2012-11-19 DIAGNOSIS — R059 Cough, unspecified: Secondary | ICD-10-CM | POA: Insufficient documentation

## 2012-11-19 DIAGNOSIS — I10 Essential (primary) hypertension: Secondary | ICD-10-CM

## 2012-11-19 DIAGNOSIS — R739 Hyperglycemia, unspecified: Secondary | ICD-10-CM

## 2012-11-19 LAB — COMPREHENSIVE METABOLIC PANEL
ALT: 10 U/L (ref 0–53)
AST: 16 U/L (ref 0–37)
Albumin: 3.9 g/dL (ref 3.5–5.2)
Alkaline Phosphatase: 59 U/L (ref 39–117)
Potassium: 4 mEq/L (ref 3.5–5.3)
Sodium: 139 mEq/L (ref 135–145)
Total Protein: 6.9 g/dL (ref 6.0–8.3)

## 2012-11-19 LAB — PSA: PSA: 1.05 ng/mL (ref <=4.00)

## 2012-11-19 LAB — LIPID PANEL: LDL Cholesterol: 96 mg/dL (ref 0–99)

## 2012-11-19 LAB — HEMOGLOBIN A1C: Hgb A1c MFr Bld: 6.2 % — ABNORMAL HIGH (ref <5.7)

## 2012-11-19 LAB — CBC
Hemoglobin: 14.3 g/dL (ref 13.0–17.0)
MCHC: 34.7 g/dL (ref 30.0–36.0)
RDW: 14.7 % (ref 11.5–15.5)

## 2012-11-19 NOTE — Addendum Note (Signed)
Addended by: Kandis Fantasia B on: 11/19/2012 09:09 AM   Modules accepted: Orders

## 2012-11-20 ENCOUNTER — Encounter: Payer: Self-pay | Admitting: Family Medicine

## 2012-11-20 DIAGNOSIS — Z8546 Personal history of malignant neoplasm of prostate: Secondary | ICD-10-CM | POA: Insufficient documentation

## 2012-11-20 DIAGNOSIS — R05 Cough: Secondary | ICD-10-CM | POA: Insufficient documentation

## 2012-11-20 DIAGNOSIS — E1129 Type 2 diabetes mellitus with other diabetic kidney complication: Secondary | ICD-10-CM | POA: Insufficient documentation

## 2012-11-20 DIAGNOSIS — R059 Cough, unspecified: Secondary | ICD-10-CM | POA: Insufficient documentation

## 2012-11-20 NOTE — Assessment & Plan Note (Signed)
Check CXR, a portable xray from hospital showed mild emphysematous changes. He is also on ACEI, other possibility GI related

## 2012-11-20 NOTE — Assessment & Plan Note (Signed)
On statin check FLP

## 2012-11-20 NOTE — Assessment & Plan Note (Signed)
Uncontrolled, family worried why he is on so many medications, will have him f/u in 2 weeks for bp recheck, no change, check lytes

## 2012-11-20 NOTE — Assessment & Plan Note (Signed)
No recent CP, medical therapy

## 2012-11-20 NOTE — Progress Notes (Signed)
  Subjective:    Patient ID: ZYEIR DYMEK, male    DOB: 02-Oct-1926, 78 y.o.   MRN: [id number]  HPI Pt here to establish care, previous PCP Dr. Sherryll Burger Medications and history reviewed No specific concerns from pt, family concerned about his medications, diagnosis of DM from hospital. He has had cough for past few months, dry cough, no CP, no SOB.  Wife recently passed from pancreatic cancer +flu shot, +shingles, +pneumovax History of prostate cancer > 10 years ago, due for PSA check   Review of Systems  GEN- denies fatigue, fever, weight loss,weakness, recent illness HEENT- denies eye drainage, change in vision, nasal discharge, CVS- denies chest pain, palpitations RESP- denies SOB,+ cough, wheeze ABD- denies N/V, change in stools, abd pain GU- denies dysuria, hematuria, dribbling, incontinence MSK- denies joint pain, muscle aches, injury Neuro- denies headache, dizziness, syncope, seizure activity      Objective:   Physical Exam GEN- NAD, alert and oriented x3 HEENT- PERRL, EOMI, non injected sclera, pink conjunctiva, MMM, oropharynx clear Neck- Supple, no LAD CVS- RRR, no murmur RESP-CTAB ABS-NABS,soft,NT,ND EXT- No edema Pulses- Radial, DP- 2+ Psych-normal affect and mood        Assessment & Plan:

## 2012-11-20 NOTE — Assessment & Plan Note (Signed)
Check A1C, daughter stopped his metformin because they did not believe diagnosis.

## 2012-12-02 ENCOUNTER — Ambulatory Visit (INDEPENDENT_AMBULATORY_CARE_PROVIDER_SITE_OTHER): Payer: Medicare Other | Admitting: Family Medicine

## 2012-12-02 ENCOUNTER — Encounter: Payer: Self-pay | Admitting: Family Medicine

## 2012-12-02 ENCOUNTER — Ambulatory Visit: Payer: Medicare Other | Admitting: Family Medicine

## 2012-12-02 VITALS — BP 122/72 | HR 70 | Temp 99.0°F | Resp 18 | Ht 63.5 in | Wt 140.1 lb

## 2012-12-02 DIAGNOSIS — J111 Influenza due to unidentified influenza virus with other respiratory manifestations: Secondary | ICD-10-CM | POA: Insufficient documentation

## 2012-12-02 DIAGNOSIS — E119 Type 2 diabetes mellitus without complications: Secondary | ICD-10-CM

## 2012-12-02 DIAGNOSIS — I10 Essential (primary) hypertension: Secondary | ICD-10-CM

## 2012-12-02 DIAGNOSIS — R269 Unspecified abnormalities of gait and mobility: Secondary | ICD-10-CM

## 2012-12-02 DIAGNOSIS — E782 Mixed hyperlipidemia: Secondary | ICD-10-CM

## 2012-12-02 DIAGNOSIS — J988 Other specified respiratory disorders: Secondary | ICD-10-CM

## 2012-12-02 DIAGNOSIS — R2681 Unsteadiness on feet: Secondary | ICD-10-CM | POA: Insufficient documentation

## 2012-12-02 DIAGNOSIS — R6889 Other general symptoms and signs: Secondary | ICD-10-CM

## 2012-12-02 MED ORDER — LISINOPRIL 20 MG PO TABS: 20.0000 mg | ORAL_TABLET | Freq: Two times a day (BID) | ORAL | Status: DC

## 2012-12-02 MED ORDER — AMLODIPINE BESYLATE 10 MG PO TABS: 10.0000 mg | ORAL_TABLET | Freq: Every day | ORAL | Status: DC

## 2012-12-02 MED ORDER — CEFTRIAXONE SODIUM 1 G IJ SOLR
500.0000 mg | Freq: Once | INTRAMUSCULAR | Status: AC
  Administered 2012-12-02: 500 mg via INTRAMUSCULAR

## 2012-12-02 MED ORDER — AZITHROMYCIN 250 MG PO TABS: ORAL_TABLET | ORAL | Status: AC

## 2012-12-02 MED ORDER — PANTOPRAZOLE SODIUM 20 MG PO TBEC: 20.0000 mg | DELAYED_RELEASE_TABLET | Freq: Every day | ORAL | Status: DC | PRN

## 2012-12-02 MED ORDER — SIMVASTATIN 20 MG PO TABS: 20.0000 mg | ORAL_TABLET | Freq: Every evening | ORAL | Status: DC

## 2012-12-02 NOTE — Assessment & Plan Note (Signed)
I reviewed his PCPs notes. He does have a diagnosis of diabetes mellitus this came at a later stage in his life. His A1c looks very good off of the metformin  And they try to watch his diet. At this time I will not start him back on oral medication and will have him test his blood sugars weekly. They will call if his blood sugars elevate

## 2012-12-02 NOTE — Assessment & Plan Note (Signed)
He is being covered for influenza with Tamiflu. He will complete the course of this. If he is not improving him based on his lung exam I'll also cover him for possible community acquired pneumonia he was given a Rocephin injection 500 mg and will complete azithromycin. He will continue Robitussin  as needed for cough. Also advised humidifier

## 2012-12-02 NOTE — Assessment & Plan Note (Signed)
Shower chair and bedside commode ordered

## 2012-12-02 NOTE — Addendum Note (Signed)
Addended by: Milinda Antis F on: 12/02/2012 09:52 PM   Modules accepted: Orders

## 2012-12-02 NOTE — Assessment & Plan Note (Addendum)
Blood pressure much improved today, continue current medications  I had a very lengthy conversation with his 2 daughters today which of note are different from the daughters  at our previous visit who also had multiple concerns complaints and questions. I spent over 30 minutes with his family going over his lab results and why he is on certain medications and why he's not on other medications. We discussed the diabetes in detail as well as the blood pressure. I advised him that he will not have very tight control of his medical problems at the expense of causing harm. I am aware the family is still grieving the  recent loss her their mother to pancreatic cancer it appears they are fishing for concerns/problems about her medical treatment over the past 20 years and whether or not their father has been treated correctly. I will try my best to be very transparent with this family and willl discuss at the next visit having at least the same family member come with him to  each appointment so that this decreases confusion.    My nurse spent time showing family how to operate BP machine after visit

## 2012-12-02 NOTE — Patient Instructions (Addendum)
Start antibiotic pills  Continue blood pressure medication Record blood pressure after medications taken  Take blood sugar fasting once a week and record - no medications needed Shower chair and bedside commode to be ordered Use robitussin for cough F/U 2 months for memory/ blood pressure ( Pt needs 30 minute slot)

## 2012-12-02 NOTE — Assessment & Plan Note (Signed)
Well controlled continue zocor

## 2012-12-02 NOTE — Assessment & Plan Note (Signed)
Per above, will cover for CAP

## 2012-12-02 NOTE — Progress Notes (Signed)
  Subjective:    Patient ID: Kenneth Potter, male    DOB: [birth year], 79 y.o.   MRN: [id number]  HPI  Patient here to followup interim visit. He is here today with 2 of his other daughters who have many concerns and questions. He was treated for influenza by the urgent care last Friday he is completing Tamiflu. He states he has not improved very much and continues to have fever and cough which causes pain in his sides. He denies shortness of breath and his daughter states that she is only heard him wheeze once.  Lab tests were reviewed with patient and family. To have a blood pressure meter here in the office today which they would like to have checked The family is also requesting a bedside commode and shower chair as his difficulty ambulating around the home. He was use in his late wife's when she was in hospice. They would like to have him tested for dementia  Review of Systems  GEN- + fatigue,+ fever, weight loss,weakness,+ recent illness HEENT- denies eye drainage, change in vision, nasal discharge, CVS- denies chest pain, palpitations RESP- denies SOB, +cough, wheeze ABD- denies N/V, change in stools, abd pain GU- denies dysuria, hematuria, dribbling, incontinence MSK- denies joint pain, +muscle aches, injury Neuro- denies headache, dizziness, syncope, seizure activity      Objective:   Physical Exam GEN- NAD, alert and oriented x3, fatigued appearing HEENT- PERRL, EOMI, non injected sclera, pink conjunctiva, MMM, oropharynx clear, TM clear bilat, nares clear rhinorrhea Neck- Supple, no LAD CVS- RRR, no murmur RESP-Course rhonchi L >R base, good air movement , no wheeze  ABS-NABS,soft,NT,ND EXT- No edema Pulses- Radial 2+ Neuro- slow gait, requires help up to bed         Assessment & Plan:

## 2013-02-03 ENCOUNTER — Encounter: Payer: Self-pay | Admitting: Family Medicine

## 2013-02-03 ENCOUNTER — Ambulatory Visit (INDEPENDENT_AMBULATORY_CARE_PROVIDER_SITE_OTHER): Payer: Medicare Other | Admitting: Family Medicine

## 2013-02-03 VITALS — BP 122/80 | HR 57 | Resp 16 | Ht 63.5 in | Wt 142.0 lb

## 2013-02-03 DIAGNOSIS — I10 Essential (primary) hypertension: Secondary | ICD-10-CM

## 2013-02-03 DIAGNOSIS — I498 Other specified cardiac arrhythmias: Secondary | ICD-10-CM

## 2013-02-03 DIAGNOSIS — R079 Chest pain, unspecified: Secondary | ICD-10-CM

## 2013-02-03 DIAGNOSIS — R001 Bradycardia, unspecified: Secondary | ICD-10-CM

## 2013-02-03 DIAGNOSIS — G3184 Mild cognitive impairment, so stated: Secondary | ICD-10-CM

## 2013-02-03 NOTE — Progress Notes (Signed)
  Subjective:    Patient ID: Kenneth Potter, male    DOB: 10/29/1926, 79 y.o.   MRN: [id number]  HPI  Patient presents to followup hypertension and memory. He was seen a few weeks ago secondary to influenza possible pneumonia. He's been doing well on his medications but states that he has had episodes of substernal chest pain on and off she starts on the left side and moves to the right and goes back and lasts for a few seconds and typically occurs when he is exerting himself and is relieved by rest. He did have a cardiac catheterization in July of 2013 which was fairly normal. He occasionally feels swimmy headed but denies any syncopal event. One of his daughters brought up the subject of his memory he states that he does forget somethings and is confused at times but overall feels good. They have not had any behavioral problems no wandering he sleeps fairly well but he will sleep late into the morning if not awoken. He has a brother with mild dementia per family  Review of Systems - per above   GEN- denies fatigue, fever, weight loss,weakness, recent illness HEENT- denies eye drainage, change in vision, nasal discharge, CVS- + chest pain, palpitations RESP- denies SOB, cough, wheeze ABD- denies N/V, change in stools, abd pain GU- denies dysuria, hematuria, dribbling, incontinence MSK- denies joint pain, muscle aches, injury Neuro- denies headache, dizziness, syncope, seizure activity      Objective:   Physical Exam  GEN- NAD, alert and oriented x3, HEENT- PERRL, EOMI, non injected sclera, pink conjunctiva, MMM, oropharynx clear, TM clear bilat, nares clear rhinorrhea Neck- Supple, no LAD CVS- Bradycardia, no murmur RESP-CTAB ABS-NABS,soft,NT,ND EXT- No edema Pulses- Radial 2+ Neuro- CNII-XII in tact, no focal deficits, slow gait MMSE 22/30   EKG- Sinus bradycardia HR 56, inverted T wave V waves , flat t wave II, III AVF     Assessment & Plan:

## 2013-02-03 NOTE — Patient Instructions (Signed)
Referral back to [facility] in [place] for the chest pain Continue the medications Miralax 1 scoopful every morning for constipation Watch his memory  F/U 3 months

## 2013-02-04 DIAGNOSIS — R079 Chest pain, unspecified: Secondary | ICD-10-CM | POA: Insufficient documentation

## 2013-02-04 DIAGNOSIS — F0390 Unspecified dementia without behavioral disturbance: Secondary | ICD-10-CM | POA: Insufficient documentation

## 2013-02-04 NOTE — Assessment & Plan Note (Signed)
Discussed with family, given options of proceeding with MRI, blood work, possible meds, vs, watching and no intervention Pt did not want anything done at this time, family agreed, they did not want him to have any extra testing as most of them were unconcerned with his memory loss

## 2013-02-04 NOTE — Assessment & Plan Note (Signed)
He has had CP on and off, negative Cath, EKG reassuring, refer back to cards for any further intervention, concerned about adding NTG with his current BP, dizzy spells

## 2013-02-04 NOTE — Assessment & Plan Note (Signed)
Well controlled, no current beta blockade

## 2013-02-04 NOTE — Assessment & Plan Note (Signed)
Doubt this is causing CP, but it may be contributing to the dizzy spells, refer back to cardiology

## 2013-02-19 ENCOUNTER — Ambulatory Visit (INDEPENDENT_AMBULATORY_CARE_PROVIDER_SITE_OTHER): Payer: Medicare Other | Admitting: Adult Health

## 2013-02-19 ENCOUNTER — Encounter: Payer: Self-pay | Admitting: Adult Health

## 2013-02-19 VITALS — BP 110/70 | Ht 64.0 in | Wt 142.1 lb

## 2013-02-19 DIAGNOSIS — R001 Bradycardia, unspecified: Secondary | ICD-10-CM

## 2013-02-19 DIAGNOSIS — I498 Other specified cardiac arrhythmias: Secondary | ICD-10-CM

## 2013-02-19 DIAGNOSIS — I10 Essential (primary) hypertension: Secondary | ICD-10-CM

## 2013-02-19 DIAGNOSIS — R42 Dizziness and giddiness: Secondary | ICD-10-CM

## 2013-02-19 DIAGNOSIS — I200 Unstable angina: Secondary | ICD-10-CM

## 2013-02-19 DIAGNOSIS — I2510 Atherosclerotic heart disease of native coronary artery without angina pectoris: Secondary | ICD-10-CM

## 2013-02-19 NOTE — Patient Instructions (Addendum)
Your physician recommends that you schedule a follow-up appointment in: KEEP APT WITH MD ARIDA ON 03-24-13  .Your physician has recommended that you wear an event monitor. Event monitors are medical devices that record the heart's electrical activity. Doctors most often [place] these monitors to diagnose arrhythmias. Arrhythmias are problems with the speed or rhythm of the heartbeat. The monitor is a small, portable device. You can wear one while you do your normal daily activities. This is usually used to diagnose what is causing palpitations/syncope (passing out).TIL 03-11-13  Your physician has recommended you make the following change in your medication:   1) KEEP NITRO SPRAY WITH YOU AT ALL TIMES The proper use and anticipated side effects of nitroglycerine has been carefully explained.  If a single episode of chest pain is not relieved by one SPRAY the patient will try another within 5 minutes; and if this doesn't relieve the pain, the patient is instructed to call 911 for transportation to an emergency department.

## 2013-02-19 NOTE — Assessment & Plan Note (Signed)
Currently well controlled. No changes in medications at this time. 

## 2013-02-19 NOTE — Assessment & Plan Note (Signed)
Doubt chest pain is related to cardiac disease with non-obstructive disease per cardiac cath in 06/2012. I have, however, given him Rx for NTG to use for recurrent pain in case he is having coronary spasm. I have asked him to be sitting down when he takes this on a PRN basis. He will continue follow up with Dr. Kirke Corin in March. No ischemic testing is planned at this time.

## 2013-02-19 NOTE — Progress Notes (Deleted)
Name: Kenneth Potter    DOB: [birth year]  Age: 79 y.o.  MR#: [id number]       PCP:  Milinda Antis, MD      Insurance: Payor: [employer] MEDICARE  Plan: AARP MEDICARE COMPLETE  Product Type: *No Product type*    CC:    Chief Complaint  Patient presents with  . Follow-up    VS Filed Vitals:   02/19/13 1449  BP: 110/70  Height: 5\' 4"  (1.626 m)  Weight: 142 lb 1.9 oz (64.465 kg)    Weights Current Weight  02/19/13 142 lb 1.9 oz (64.465 kg)  02/03/13 142 lb (64.411 kg)  12/02/12 140 lb 1.9 oz (63.558 kg)    Blood Pressure  BP Readings from Last 3 Encounters:  02/19/13 110/70  02/03/13 122/80  12/02/12 122/72     Admit date:  (Not on file) Last encounter with RMR:  Visit date not found   Allergy Codeine and Aspirin  Current Outpatient Prescriptions  Medication Sig Dispense Refill  . amLODipine (NORVASC) 10 MG tablet Take 1 tablet (10 mg total) by mouth daily.  30 tablet  3  . lisinopril (PRINIVIL,ZESTRIL) 20 MG tablet Take 1 tablet (20 mg total) by mouth 2 (two) times daily.  60 tablet  3  . simvastatin (ZOCOR) 20 MG tablet Take 1 tablet (20 mg total) by mouth every evening.  30 tablet  3  . pantoprazole (PROTONIX) 20 MG tablet Take 1 tablet (20 mg total) by mouth daily as needed. For heartburn  30 tablet  1   No current facility-administered medications for this visit.    Discontinued Meds:   There are no discontinued medications.  Patient Active Problem List  Diagnosis  . Bradycardia  . Dizziness  . Unstable angina pectoris  . Midsternal chest pain  . CAD (coronary artery disease)  . Mixed hyperlipidemia  . Essential hypertension, benign  . Diabetes mellitus  . History of prostate cancer  . Cough  . Gait instability  . Chest pain  . Mild cognitive impairment    LABS @BMET3 @  @CMPRESULT3 @ @CBC3 @  Lipid Panel     Component Value Date/Time   CHOL 160 11/19/2012 0750   TRIG 70 11/19/2012 0750   HDL 50 11/19/2012 0750   CHOLHDL 3.2 11/19/2012  0750   VLDL 14 11/19/2012 0750   LDLCALC 96 11/19/2012 0750    ABG No results found for this basename: phart, pco2, pco2art, po2, po2art, hco3, tco2, acidbasedef, o2sat     BNP (last 3 results) No results found for this basename: PROBNP,  in the last 8760 hours Cardiac Panel (last 3 results) No results found for this basename: CKTOTAL, CKMB, TROPONINI, RELINDX,  in the last 72 hours  Iron/TIBC/Ferritin No results found for this basename: iron, tibc, ferritin     EKG Orders placed in visit on 02/19/13  . EKG 12-LEAD  . EKG 12-LEAD     Prior Assessment and Plan Problem List as of 02/19/2013     ICD-9-CM   Bradycardia   Last Assessment & Plan   02/03/2013 Office Visit Written 02/04/2013  9:17 PM by Salley Scarlet, MD     Doubt this is causing CP, but it may be contributing to the dizzy spells, refer back to cardiology    Dizziness   Last Assessment & Plan   02/26/2012 Office Visit Written 02/26/2012  4:21 PM by Iran Ouch, MD     Some of his symptoms seem to be due to vertigo.  This has not been associated with any other symptoms such as nausea, vomiting or neurologic symptoms. There is no strong suspicion for posterior cerebral ischemia.    Unstable angina pectoris   Last Assessment & Plan   07/02/2012 Office Visit Written 07/02/2012 11:06 AM by Prescott Parma, PA      Patient presents with new onset chest discomfort, typically precipitated by walking, with acceleration over the last week. He has no known CAD, with history of a negative catheterization in 2006, and a negative, nondiagnostic stress test, 2/12. He has history of normal LV function. Cardiac risk factors are notable for HTN, HLD, age, and remote tobacco. He is currently hemodynamically stable, and 12-lead EKG is negative for acute changes. However, patient's presenting symptoms are quite worrisome for significant CAD, and recommendation is to refer him directly to [facility] ED for initial blood work and repeat EKG. We will  then plan to either transfer him directly to [facility] later today, or in the a.m., for recommended cardiac catheterization. Patient is agreeable with the plan for a repeat catheterization, to rule out significant CAD. If cardiac markers are abnormal, he will need to be started on IV heparin. We will defer treatment with Plavix, given his advanced age and possibility of multivessel CAD.    Midsternal chest pain   CAD (coronary artery disease)   Last Assessment & Plan   11/18/2012 Office Visit Written 11/20/2012  8:51 PM by Salley Scarlet, MD     No recent CP, medical therapy    Mixed hyperlipidemia   Last Assessment & Plan   12/02/2012 Office Visit Written 12/02/2012  3:46 PM by Salley Scarlet, MD     Well controlled continue zocor    Essential hypertension, benign   Last Assessment & Plan   02/03/2013 Office Visit Written 02/04/2013  9:19 PM by Salley Scarlet, MD     Well controlled, no current beta blockade    Diabetes mellitus   Last Assessment & Plan   12/02/2012 Office Visit Written 12/02/2012  3:52 PM by Salley Scarlet, MD     I reviewed his PCPs notes. He does have a diagnosis of diabetes mellitus this came at a later stage in his life. His A1c looks very good off of the metformin  And they try to watch his diet. At this time I will not start him back on oral medication and will have him test his blood sugars weekly. They will call if his blood sugars elevate     History of prostate cancer   Cough   Last Assessment & Plan   11/18/2012 Office Visit Written 11/20/2012  8:57 PM by Salley Scarlet, MD     Check CXR, a portable xray from hospital showed mild emphysematous changes. He is also on ACEI, other possibility GI related    Gait instability   Last Assessment & Plan   12/02/2012 Office Visit Written 12/02/2012  3:54 PM by Salley Scarlet, MD     Shower chair and bedside commode ordered    Chest pain   Last Assessment & Plan   02/03/2013 Office Visit Written 02/04/2013  9:18 PM by  Salley Scarlet, MD     He has had CP on and off, negative Cath, EKG reassuring, refer back to cards for any further intervention, concerned about adding NTG with his current BP, dizzy spells    Mild cognitive impairment   Last Assessment & Plan   02/03/2013 Office Visit Written 02/04/2013  9:20 PM by Salley Scarlet, MD     Discussed with family, given options of proceeding with MRI, blood work, possible meds, vs, watching and no intervention Pt did not want anything done at this time, family agreed, they did not want him to have any extra testing as most of them were unconcerned with his memory loss        Imaging: No results found.
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
## 2013-02-19 NOTE — Assessment & Plan Note (Signed)
Symptoms of chest pain, dizziness, and dyspnea can possibly be related to bradycardia. He was found to have episodes during hospitalization in July. Is bradycardic here in the office, although not profoundly so.  I will place a cardiac monitor on him for 21 days. He will see Dr. Kirke Corin on previously planned office visit to discuss the results.

## 2013-02-20 ENCOUNTER — Encounter: Payer: Self-pay | Admitting: Family Medicine

## 2013-02-20 NOTE — Addendum Note (Signed)
Addended by: Reather Laurence A on: 02/20/2013 09:06 AM   Modules accepted: Orders

## 2013-02-26 ENCOUNTER — Ambulatory Visit: Payer: Medicare Other | Admitting: Physician Assistant

## 2013-03-12 ENCOUNTER — Telehealth: Payer: Self-pay | Admitting: *Deleted

## 2013-03-12 ENCOUNTER — Telehealth: Payer: Self-pay | Admitting: Cardiovascular Disease

## 2013-03-12 NOTE — Telephone Encounter (Signed)
Patient has NTG Spray, he has not used it and does not want to use it.  He would like the NTG tablets.  Patient reports that he has CP off and on since Monday.  Patient states not active CP now.  I spoke with RN.  Patient advised to go to ER if CP continues.  Advised to patient that I would send message to nurse to call him regarding the NTG.  Patient had a monitor that was supposed to be worn for 2 wks, patient did not wear the monitor full two weeks.  He was given NTG spray then, but not sure who gave it to him.

## 2013-03-12 NOTE — Telephone Encounter (Signed)
Noted pt to have [employer] completed on 03-11-13, called [employer] rep and was advised the pt has no activity for this pt since 02-26-13 and the pt had several contact phone calls/vm left from [employer], rep also noted there was a message sent to the cardionet to deactivate and was unable to contact the dept at this time to clarify,advised to contact rep in case no call back per incoming calls from [employer] listed as 800 number and some pt ignore those calls, contacted pt to clarify, pt noted he removed the monitor 03-05-13 as he was advised and that he brought the box back to our office, notes he wore the device 24hours since placed on his person. Advised once reviewed we will contact him with results, pt understood

## 2013-03-16 ENCOUNTER — Other Ambulatory Visit: Payer: Self-pay | Admitting: Adult Health

## 2013-03-16 DIAGNOSIS — R42 Dizziness and giddiness: Secondary | ICD-10-CM

## 2013-03-16 DIAGNOSIS — R001 Bradycardia, unspecified: Secondary | ICD-10-CM

## 2013-03-16 DIAGNOSIS — I200 Unstable angina: Secondary | ICD-10-CM

## 2013-03-16 NOTE — Telephone Encounter (Signed)
Pt cardionet end o summary report reviewed by MD Ross, faxed to ordering physician for review and pt will contacted when appropriate 

## 2013-03-24 ENCOUNTER — Ambulatory Visit (INDEPENDENT_AMBULATORY_CARE_PROVIDER_SITE_OTHER): Payer: Medicare Other | Admitting: Cardiovascular Disease

## 2013-03-24 ENCOUNTER — Encounter: Payer: Self-pay | Admitting: Cardiovascular Disease

## 2013-03-24 VITALS — BP 120/75 | HR 64 | Ht 64.0 in | Wt 142.0 lb

## 2013-03-24 DIAGNOSIS — R079 Chest pain, unspecified: Secondary | ICD-10-CM

## 2013-03-24 DIAGNOSIS — R42 Dizziness and giddiness: Secondary | ICD-10-CM

## 2013-03-24 MED ORDER — PANTOPRAZOLE SODIUM 40 MG PO TBEC: 40.0000 mg | DELAYED_RELEASE_TABLET | Freq: Every day | ORAL | Status: DC

## 2013-03-24 NOTE — Assessment & Plan Note (Signed)
This has been a chronic issue for him. I reviewed his recent outpatient telemetry which showed mild sinus bradycardia but not less than 50 beats per minute. There was also short runs of SVT with occasional PACs and PVCs. At this point, I still don't think there is an indication for a permanent pacemaker. Continue observation. I would avoid any beta blockers or non-dihydropyridine calcium channel blockers.

## 2013-03-24 NOTE — Assessment & Plan Note (Signed)
His chest pain is very atypical and happens mostly at night before he goes to sleep. No exertional symptoms. This could be due to GERD and thus I recommend resuming protonic 40 mg once daily. I suspect that there is also an anxiety component especially after the death of his wife 3 months ago. He had cardiac catheterization done last year which showed stable 40% stenosis in the RCA with no evidence of obstructive CAD.

## 2013-03-24 NOTE — Patient Instructions (Addendum)
Start Protonix 40 mg once daily.  Use Benadryl 25-50 mg at bedtime as needed for insomnia.  Follow up in 6 months.
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
## 2013-03-30 ENCOUNTER — Telehealth: Payer: Self-pay | Admitting: Family Medicine

## 2013-04-01 NOTE — Telephone Encounter (Signed)
Daughter states she got some liquid multivitamin but she doesn't think he will take it. Just wanting him to have more energy

## 2013-04-02 ENCOUNTER — Telehealth: Payer: Self-pay | Admitting: Adult Health

## 2013-04-02 MED ORDER — NITROGLYCERIN 0.4 MG SL SUBL: 0.4000 mg | SUBLINGUAL_TABLET | SUBLINGUAL | Status: DC | PRN

## 2013-04-02 NOTE — Telephone Encounter (Signed)
Sent new RX for Nitro pills into pt pharmacy called pt to advise RX sent however if his CP worsen to please go to the ED for evaluation, spoke to Vevelyn Royals pt daughter whom understood and will monitor sxs

## 2013-04-02 NOTE — Telephone Encounter (Signed)
PT STATES HE HAS STILL BEEN HAVING CP AND DOESN'T LIKE NITRO SPRAY WOULD LIKE [place] TO CALL IN THE NITRO PILLS.

## 2013-04-03 ENCOUNTER — Telehealth: Payer: Self-pay | Admitting: Family Medicine

## 2013-04-03 MED ORDER — OMEPRAZOLE 40 MG PO CPDR: 40.0000 mg | DELAYED_RELEASE_CAPSULE | Freq: Every day | ORAL | Status: DC

## 2013-04-03 NOTE — Telephone Encounter (Signed)
prilosec sent

## 2013-04-03 NOTE — Telephone Encounter (Signed)
Needs to change back to prilosec

## 2013-05-01 ENCOUNTER — Other Ambulatory Visit: Payer: Self-pay | Admitting: Family Medicine

## 2013-05-05 ENCOUNTER — Ambulatory Visit (INDEPENDENT_AMBULATORY_CARE_PROVIDER_SITE_OTHER): Payer: Medicare Other | Admitting: Family Medicine

## 2013-05-05 ENCOUNTER — Encounter: Payer: Self-pay | Admitting: Family Medicine

## 2013-05-05 VITALS — BP 122/80 | HR 62 | Resp 18 | Ht 63.5 in | Wt 144.0 lb

## 2013-05-05 DIAGNOSIS — IMO0001 Reserved for inherently not codable concepts without codable children: Secondary | ICD-10-CM

## 2013-05-05 DIAGNOSIS — M179 Osteoarthritis of knee, unspecified: Secondary | ICD-10-CM | POA: Insufficient documentation

## 2013-05-05 DIAGNOSIS — IMO0002 Reserved for concepts with insufficient information to code with codable children: Secondary | ICD-10-CM

## 2013-05-05 DIAGNOSIS — M1710 Unilateral primary osteoarthritis, unspecified knee: Secondary | ICD-10-CM

## 2013-05-05 DIAGNOSIS — E785 Hyperlipidemia, unspecified: Secondary | ICD-10-CM

## 2013-05-05 DIAGNOSIS — E119 Type 2 diabetes mellitus without complications: Secondary | ICD-10-CM

## 2013-05-05 DIAGNOSIS — I10 Essential (primary) hypertension: Secondary | ICD-10-CM

## 2013-05-05 DIAGNOSIS — R21 Rash and other nonspecific skin eruption: Secondary | ICD-10-CM

## 2013-05-05 DIAGNOSIS — K219 Gastro-esophageal reflux disease without esophagitis: Secondary | ICD-10-CM

## 2013-05-05 DIAGNOSIS — R079 Chest pain, unspecified: Secondary | ICD-10-CM

## 2013-05-05 LAB — GLUCOSE, POCT (MANUAL RESULT ENTRY): POC Glucose: 168 mg/dl — AB (ref 70–99)

## 2013-05-05 MED ORDER — LISINOPRIL 20 MG PO TABS: 20.0000 mg | ORAL_TABLET | Freq: Two times a day (BID) | ORAL | Status: DC

## 2013-05-05 MED ORDER — AMLODIPINE BESYLATE 10 MG PO TABS: ORAL_TABLET | ORAL | Status: DC

## 2013-05-05 MED ORDER — NITROGLYCERIN 0.4 MG SL SUBL: 0.4000 mg | SUBLINGUAL_TABLET | SUBLINGUAL | Status: DC | PRN

## 2013-05-05 MED ORDER — OMEPRAZOLE 40 MG PO CPDR: 40.0000 mg | DELAYED_RELEASE_CAPSULE | Freq: Every day | ORAL | Status: DC

## 2013-05-05 MED ORDER — SIMVASTATIN 20 MG PO TABS: ORAL_TABLET | ORAL | Status: DC

## 2013-05-05 NOTE — Assessment & Plan Note (Signed)
Random blood sugar in office today was 168 he has eaten today. No current medications will check another A1c

## 2013-05-05 NOTE — Assessment & Plan Note (Signed)
I think there is a component of acid reflux worse after dinner and laying down to try to get him to take it PPI every day however he is very reluctant to do so even though it helps his pain

## 2013-05-05 NOTE — Addendum Note (Signed)
Addended by: Kandis Fantasia B on: 05/05/2013 04:48 PM   Modules accepted: Orders

## 2013-05-05 NOTE — Assessment & Plan Note (Signed)
Blood pressure well controlled

## 2013-05-05 NOTE — Progress Notes (Signed)
  Subjective:    Patient ID: Kenneth Potter, male    DOB: 12-31-1926, 79 y.o.   MRN: [id number]  HPI  Patient here to follow chronic medical problems. He continues to have the atypical chest pain mostly at nighttime after he lays down but states when he lays flat it goes away. He was prescribed a PPI by his cardiologist he took a few doses and this did take the pain away but he does not want to take multiple medications. He did try using his nitroglycerin spray but this did not help his chest pain. He also complains of bilateral knee pain in the left typically he denies any fall or swelling states that it locks up on him at times  Review of Systems   GEN- denies fatigue, fever, weight loss,weakness, recent illness HEENT- denies eye drainage, change in vision, nasal discharge, CVS- + chest pain, palpitations RESP- denies SOB, cough, wheeze ABD- denies N/V, change in stools, abd pain GU- denies dysuria, hematuria, dribbling, incontinence MSK- +joint pain, muscle aches, injury Neuro- denies headache, dizziness, syncope, seizure activity      Objective:   Physical Exam  GEN- NAD, alert and oriented x3 HEENT- PERRL, EOMI, non injected sclera, pink conjunctiva, MMM, oropharynx clear Neck- Supple, CVS- RRR, no murmur RESP-CTAB ABD-NABS,soft,NT,ND EXT- Trace pedal edema MSK- KNees- normal inspection, no effusion, mild TTP left knee lateral aspect, mild crepitus, fair ROM, Fair ROM hips, no pain with ROM hips Pulses- Radial, DP- 2+        Assessment & Plan:

## 2013-05-05 NOTE — Assessment & Plan Note (Signed)
For now will use Tylenol ES BID NO effusion, activity at baseline

## 2013-05-05 NOTE — Assessment & Plan Note (Signed)
Ongoing atypical chest pain does not respond to nitroglycerin does respond to PPI. No obstructive coronary artery disease he's been evaluated by cardiology for this twice

## 2013-05-05 NOTE — Patient Instructions (Signed)
Continue current medications For arthritis in knees- 1 ES tylenol twice a day as needed Lab work to be done - 4 weeks (fasting) Take the prilosec with dinner, to help with the chest discomfort at night F/U 6 months

## 2013-05-08 ENCOUNTER — Telehealth: Payer: Self-pay | Admitting: Family Medicine

## 2013-05-08 MED ORDER — CLOTRIMAZOLE-BETAMETHASONE 1-0.05 % EX CREA: TOPICAL_CREAM | CUTANEOUS | Status: DC

## 2013-05-08 NOTE — Telephone Encounter (Signed)
Lotrisone sent

## 2013-05-10 DIAGNOSIS — R21 Rash and other nonspecific skin eruption: Secondary | ICD-10-CM | POA: Insufficient documentation

## 2013-05-10 NOTE — Assessment & Plan Note (Signed)
Treat with lotrisone

## 2013-05-10 NOTE — Progress Notes (Signed)
  Subjective:    Patient ID: Kenneth Potter, male    DOB: [birth year], 79 y.o.   MRN: [id number]  HPI  Rash comes and goes on his leg and chest, he scratches at area on chest, no drainage form lesions, no creams tried. No bug bites per pt  Review of Systems     Objective:   Physical Exam   Maculopapular rash with mild erythema on chest under chest hairs, no pustules seen, few lesions on Right lower leg     Assessment & Plan:

## 2013-06-16 ENCOUNTER — Telehealth: Payer: Self-pay | Admitting: Family Medicine

## 2013-06-16 NOTE — Telephone Encounter (Signed)
Please call family and see what the concerns are  His lab orders have been placed from May visit, he can get done in the next 1-2 weeks. He can get this done in [place] at [facility] or come to [facility]  I sent the lotrisone cream after last visit

## 2013-06-16 NOTE — Telephone Encounter (Signed)
Pt has not been seen here yet. Family has questions about last lab work done, medications, and cream for rash.  Please advise

## 2013-06-16 NOTE — Telephone Encounter (Signed)
Left message on voicemail to return my call..Kenneth Potter number is not working called pts number instead

## 2013-06-19 NOTE — Telephone Encounter (Signed)
Left message to return my call no working number fro patsy available

## 2013-06-22 NOTE — Telephone Encounter (Signed)
lmtrc

## 2013-06-24 ENCOUNTER — Telehealth: Payer: Self-pay | Admitting: Family Medicine

## 2013-06-24 NOTE — Telephone Encounter (Signed)
Pts daughter is aware and she is going to check with her sister to see if she can bring him either this afternoon or Friday. She will call back in a little bit.

## 2013-06-24 NOTE — Telephone Encounter (Signed)
Bring him back in for appointment to have this rechecked For now keep using the lotrisone, make sure it dries then he can put socks on as long. Keep feet dry

## 2013-06-26 ENCOUNTER — Encounter: Payer: Self-pay | Admitting: Family Medicine

## 2013-06-26 ENCOUNTER — Ambulatory Visit (INDEPENDENT_AMBULATORY_CARE_PROVIDER_SITE_OTHER): Payer: Medicare Other | Admitting: Family Medicine

## 2013-06-26 VITALS — BP 120/68 | HR 60 | Temp 97.6°F | Resp 15 | Wt 143.0 lb

## 2013-06-26 DIAGNOSIS — R21 Rash and other nonspecific skin eruption: Secondary | ICD-10-CM

## 2013-06-26 MED ORDER — CEPHALEXIN 500 MG PO CAPS: 500.0000 mg | ORAL_CAPSULE | Freq: Two times a day (BID) | ORAL | Status: DC

## 2013-06-26 NOTE — Patient Instructions (Addendum)
Keep clean, take the antibiotic for infected skin rash Scabs may come off Use the triple anti-biotic cream given once a day after bathing Call if this does not improve and he will be sent to dermatology

## 2013-06-28 ENCOUNTER — Encounter: Payer: Self-pay | Admitting: Family Medicine

## 2013-06-28 NOTE — Assessment & Plan Note (Signed)
appears he had some blistering lesions which have collapsed , but lesions have appearance of superinfection. Will treat with oral antibiotics. They can apply topical if the lesions unroof, if no improvement refer to dermatology, not classic of shingles

## 2013-06-28 NOTE — Progress Notes (Signed)
  Subjective:    Patient ID: Kenneth Potter, male    DOB: 09/07/1926, 79 y.o.   MRN: [id number]  HPI  Pt here with worsening rash on right foot, treated with lotrisone at last visit for maculoapular rash on legs and foot, everything improved but then he had blisters with redness on foot that would not go away. He used the lotrisone on them last week and they flattened out,+itching, denies pain, denies foot swelling, denies fever  Review of Systems - per above GEN- denies fatigue, fever, weight loss,weakness, recent illness Skin- +rash MSK- denies joint pain, muscle aches, injury        Objective:   Physical Exam  GEN-NAD,alert and oriented x 3 Skin- Right foot- 5 dime size erythematous lesions - with appearance of flat blisters, surrounding erythema, no drainage, scabs on few, no vesicles noted, no lesions on sole, no lesions left foot, no lesions on legs Ext- no edema, DP equal bilat      Assessment & Plan:

## 2013-07-16 ENCOUNTER — Encounter: Payer: Self-pay | Admitting: Physician Assistant

## 2013-07-16 ENCOUNTER — Ambulatory Visit (HOSPITAL_COMMUNITY)
Admission: RE | Admit: 2013-07-16 | Discharge: 2013-07-16 | Disposition: A | Discharge status: Home / Self Care | Payer: Medicare Other | Source: Ambulatory Visit | Attending: Physician Assistant | Admitting: Physician Assistant

## 2013-07-16 ENCOUNTER — Ambulatory Visit (INDEPENDENT_AMBULATORY_CARE_PROVIDER_SITE_OTHER): Payer: Medicare Other | Admitting: Physician Assistant

## 2013-07-16 ENCOUNTER — Telehealth: Payer: Self-pay | Admitting: Family Medicine

## 2013-07-16 VITALS — BP 122/84 | HR 64 | Temp 98.0°F | Resp 18 | Wt 145.0 lb

## 2013-07-16 DIAGNOSIS — M79671 Pain in right foot: Secondary | ICD-10-CM

## 2013-07-16 DIAGNOSIS — M79609 Pain in unspecified limb: Secondary | ICD-10-CM

## 2013-07-16 DIAGNOSIS — M25571 Pain in right ankle and joints of right foot: Secondary | ICD-10-CM

## 2013-07-16 DIAGNOSIS — I498 Other specified cardiac arrhythmias: Secondary | ICD-10-CM

## 2013-07-16 DIAGNOSIS — R0602 Shortness of breath: Secondary | ICD-10-CM | POA: Insufficient documentation

## 2013-07-16 DIAGNOSIS — R5381 Other malaise: Secondary | ICD-10-CM

## 2013-07-16 DIAGNOSIS — R079 Chest pain, unspecified: Secondary | ICD-10-CM

## 2013-07-16 DIAGNOSIS — M25579 Pain in unspecified ankle and joints of unspecified foot: Secondary | ICD-10-CM | POA: Insufficient documentation

## 2013-07-16 DIAGNOSIS — I10 Essential (primary) hypertension: Secondary | ICD-10-CM | POA: Insufficient documentation

## 2013-07-16 DIAGNOSIS — R001 Bradycardia, unspecified: Secondary | ICD-10-CM

## 2013-07-16 NOTE — Telephone Encounter (Signed)
Pt daugther called stating that father is having chest pains and SOB X 4 days on and off, daugther gives pt nitro and he states he is feelig better. Then a few mins later he is complaining of his stomach hurting so daughter gives him prevacid thinking that he may be having indigestion and still didn't work. Pt wanted to go to hospital but daughter wanted to bring him to office first. I told her to send him to ED if he requesting to go instead of coming here but she wanted to bring him still. He has appointment today to see provider Guss Bunde. Daughter stated she will try to get him there if not then will send to ED.

## 2013-07-17 NOTE — Progress Notes (Signed)
Patient ID: Kenneth Potter MRN: [id number], DOB: [birth year], 79 y.o. Date of Encounter: @DATE @  Chief Complaint:  Chief Complaint  Patient presents with  . chest pain across front of chest,  aching pain some shortnes    had CP yesterday also  took ntg with min relief    HPI: 79 y.o. year old AA male  presents with his daughter and his son. He is here for evaluation of chest pain. They report they called and spoke to Dr. Jeanice Lim on call to ask if should go to ER. She recommended they come to the office for evaluation.  He says he has been feeling a "achy" pain across chest-he points to both right and left chest. He says he sometimes feels this during the day-can be sitting at rest or up walking around. He does say that it occurs mostly right after he has eaten. As well, he continues to feel the pain whne lying in bed at night.  The daughter reports that one of pts other daughters dispenses pts meds. She does not put protonix in his meds to take on daily basis.   He has had no discomfort in his neck or arm at the time of his CP. He has had no diaphoresis, nausea, SOB.  He also c/o pain in right ankle and foot-says this is a new pain that developed all the sudden 2 days ago. He denies any traum/injury. However, family concerned he could have had injury that nobody witnessed and that pt does not remember.   Past Medical History  Diagnosis Date  . Essential hypertension, benign   . Mixed hyperlipidemia   . Near syncope   . Chest pain     a. Felt noncardiac 06/2012 - ruled out for MI, cardiac cath with nonobstructive CAD.  [name] CAD (coronary artery disease)     Nonobstructive by cath 06/2012  . Bradycardia   . Cancer     Prostate cancer  . Diabetes mellitus without complication      Home Meds: See attached medication section for current medication list. Any medications entered into computer today will not appear on this note's list. The medications listed below were entered prior to  today. Current Outpatient Prescriptions on File Prior to Visit  Medication Sig Dispense Refill  . amLODipine (NORVASC) 10 MG tablet take 1 tablet by mouth once daily  90 tablet  1  . clotrimazole-betamethasone (LOTRISONE) cream Apply to affected area 2 times daily  45 g  1  . lisinopril (PRINIVIL,ZESTRIL) 20 MG tablet Take 1 tablet (20 mg total) by mouth 2 (two) times daily.  180 tablet  1  . nitroGLYCERIN (NITROSTAT) 0.4 MG SL tablet Place 1 tablet (0.4 mg total) under the tongue every 5 (five) minutes as needed for chest pain.  20 tablet  3  . omeprazole (PRILOSEC) 40 MG capsule Take 1 capsule (40 mg total) by mouth daily. For acid reflux  90 capsule  1  . simvastatin (ZOCOR) 20 MG tablet take 1 tablet by mouth once daily  90 tablet  1  . cephALEXin (KEFLEX) 500 MG capsule Take 1 capsule (500 mg total) by mouth 2 (two) times daily.  10 capsule  0   No current facility-administered medications on file prior to visit.    Allergies:  Allergies  Allergen Reactions  . Codeine   . Aspirin     Can not tolerate in high dosage    History   Social History  . Marital Status: Married  Spouse Name: [name]    Number of Children: N/A  . Years of Education: N/A   Occupational History  . Retired    Social History Main Topics  . Smoking status: Former Smoker    Types: Cigarettes  . Smokeless tobacco: Never Used  . Alcohol Use: No     Comment: Quit drinking 25 years ago  . Drug Use: No     Comment: Denies any history of illicit drug use  . Sexually Active: Not on file   Other Topics Concern  . Not on file   Social History Narrative  . No narrative on file    History reviewed. No pertinent family history.   Review of Systems:  See HPI for pertinent ROS. All other ROS negative.    Physical Exam: Blood pressure 122/84, pulse 64, temperature 98 F (36.7 C), temperature source Oral, resp. rate 18, weight 145 lb (65.772 kg), SpO2 94.00%., Body mass index is 25.28  kg/(m^2). General: WNWD Elderly AAM. Appears in no acute distress. Neck: Supple. No thyromegaly. No lymphadenopathy.No carotid bruits. Lungs: Clear bilaterally to auscultation without wheezes, rales, or rhonchi. Breathing is unlabored. Heart: RRR with S1 S2. No murmurs, rubs, or gallops. Musculoskeletal:  Strength and tone normal for age. There are multiple areas of tenderness with palpation of chestwall-both on the right and on the left sides.  Right Ankle and Foot: Inspection normal with no swelling. No erythema. No warmth. No pain with light palpation. There are multiple areas of tenderness with firm palpation at lateral ankle and lateral side of foot just distal to heel. Extremities/Skin: Warm and dry.  No edema.  Neuro: Alert and oriented X 3. Moves all extremities spontaneously. Gait is normal. CNII-XII grossly in tact. Psych:  Responds to questions appropriately with a normal affect.   EKG: Sinus Bradycardia 48 bpm. Nonspecific ST/T changes.  ASSESSMENT AND PLAN:  79 y.o. year old male with  1. Chest pain I have reviewed Cardiology note from 03/24/13 and Dr. Rhona Raider note from 05/05/13. Both of these notes felt that his CP was sec to GERD. He was to take protonix but was noncompliant with taking the med and still is.  I think he has some CP sec to GERD as he reports pain after eating and when lying in bed. However, there is also a component of chest wall pain. I explained all of this to the pt and family so they will understand this is not coming from his heart.  We also discussed his cardiac cath findings from cath done July 2013,which revealed minimal CAD.  We also discussed that his wife passed away about 6 mos ago and whether this is related to emotional upset/anxiety but he says he doesnot htinks so.  Family requests that while he is getting other XRays, if we could get CXR too. Will order.  - EKG 12-Lead - DG Chest 2 View; Future  2. Bradycardia This is known and is being  monitored. Cardiology aware. THis is chronic and causing no problems so cont to monitor.  3. Right ankle pain Exam not c/w gout. Will check XRay to r/o fracture. - DG Ankle Complete Right; Future  4. Right foot pain See # 3 - DG Foot Complete Right; Future  5. Physical deconditioning Daughter concerned that his "strength is declining so rapidly." I reminded her that he is 59. Offered PT referral to help with strengthening. She wants to proceed with this.  - Ambulatory referral to Physical Therapy   Signed, Shon Hale  [place], [place], [facility] 07/17/2013 7:29 AM

## 2013-08-07 DIAGNOSIS — R42 Dizziness and giddiness: Secondary | ICD-10-CM

## 2013-08-12 ENCOUNTER — Ambulatory Visit (INDEPENDENT_AMBULATORY_CARE_PROVIDER_SITE_OTHER): Payer: Medicare Other | Admitting: Family Medicine

## 2013-08-12 ENCOUNTER — Encounter: Payer: Self-pay | Admitting: Family Medicine

## 2013-08-12 VITALS — BP 124/78 | HR 68 | Temp 97.4°F | Resp 18 | Wt 140.5 lb

## 2013-08-12 DIAGNOSIS — E782 Mixed hyperlipidemia: Secondary | ICD-10-CM

## 2013-08-12 DIAGNOSIS — I10 Essential (primary) hypertension: Secondary | ICD-10-CM

## 2013-08-12 DIAGNOSIS — E119 Type 2 diabetes mellitus without complications: Secondary | ICD-10-CM

## 2013-08-12 DIAGNOSIS — G3184 Mild cognitive impairment, so stated: Secondary | ICD-10-CM

## 2013-08-12 NOTE — Assessment & Plan Note (Signed)
Blood pressure looks good continue current medication 

## 2013-08-12 NOTE — Assessment & Plan Note (Signed)
Fasting lipid panel 8 months ago within normal limits continue simvastatin

## 2013-08-12 NOTE — Assessment & Plan Note (Signed)
Overall doing well. We'll be best if he can be a little more active during the day. I'll have the family tried to sign him up at the senior center that [facility] in [facility] he agrees to this

## 2013-08-12 NOTE — Progress Notes (Signed)
  Subjective:    Patient ID: Kenneth Potter, male    DOB: Apr 08, 1926, 79 y.o.   MRN: [id number]  HPI  Patient here follow chronic medical problems. He has no specific concerns today. He was evaluated last month secondary to atypical chest pains thought to be secondary to his acid reflux. He had chest x-ray which was unremarkable an EKG which showed no changes. Over the weekend he ate [employer] and was sent to the emergency room with symptoms of food poisoning. This is now resolved. He is here today with his son and one of his daughters  They state that he typically sits around the house all day and is not very active he does go on walk in the park but otherwise stays in the home. They now have a nurse aide that is coming from one to 6 in the afternoons to be with him. Due for labs today including A1c and  metabolic panel  Review of Systems  GEN- denies fatigue, fever, weight loss,weakness, recent illness HEENT- denies eye drainage, change in vision, nasal discharge, CVS- denies chest pain, palpitations RESP- denies SOB, cough, wheeze ABD- denies N/V, change in stools, abd pain GU- denies dysuria, hematuria, dribbling, incontinence MSK- denies joint pain, muscle aches, injury Neuro- denies headache, dizziness, syncope, seizure activity      Objective:   Physical Exam   GEN- NAD, alert and oriented x3 HEENT- PERRL, EOMI, non injected sclera, pink conjunctiva, MMM, oropharynx clear Neck- Supple,no LAD CVS- RRR, no murmur RESP-CTAB ABD-NABS,soft,NT,ND EXT- No edema Skin- Bilateral feet small hyperpigmented scabs, 1 dime size lesion on medial left ankle, no blistering lesions, few excoriations, dry flaky skin  Pulse- Radial and DP equal bilat    Assessment & Plan:

## 2013-08-12 NOTE — Patient Instructions (Addendum)
Release of information for [facility] ER visit 8/8 Continue current meds We will call with lab results Try to get him to the senior center Vaseline or lotion to legs Call if the scab get red or come back he will need to see a dermatologist F/U January

## 2013-08-12 NOTE — Assessment & Plan Note (Signed)
Diet controlled. I will check A1c today

## 2013-08-13 LAB — CBC WITH DIFFERENTIAL/PLATELET
Basophils Absolute: 0.1 10*3/uL (ref 0.0–0.1)
Basophils Relative: 1 % (ref 0–1)
Eosinophils Relative: 2 % (ref 0–5)
HCT: 40.4 % (ref 39.0–52.0)
Hemoglobin: 14.2 g/dL (ref 13.0–17.0)
MCHC: 35.1 g/dL (ref 30.0–36.0)
MCV: 93.1 fL (ref 78.0–100.0)
Monocytes Absolute: 0.5 10*3/uL (ref 0.1–1.0)
Monocytes Relative: 8 % (ref 3–12)
RDW: 14.4 % (ref 11.5–15.5)

## 2013-08-13 LAB — COMPREHENSIVE METABOLIC PANEL
ALT: <8 U/L (ref 0–53)
AST: 12 U/L (ref 0–37)
Alkaline Phosphatase: 66 U/L (ref 39–117)
Chloride: 107 mEq/L (ref 96–112)
Creat: 1.35 mg/dL (ref 0.50–1.35)
Total Bilirubin: 0.6 mg/dL (ref 0.3–1.2)

## 2013-09-01 ENCOUNTER — Other Ambulatory Visit: Payer: Self-pay | Admitting: Family Medicine

## 2013-10-02 ENCOUNTER — Encounter: Payer: Self-pay | Admitting: *Deleted

## 2013-10-20 ENCOUNTER — Telehealth: Payer: Self-pay | Admitting: Family Medicine

## 2013-10-20 NOTE — Telephone Encounter (Signed)
LVM yesterday Monday 10/20 and today 10/21  I need to speak with Verlan Friends about her request for FMLA Her note is asking to be off every day, which I can not grant this, She needs to leave a contact number so we can discuss what this is in reference to There has been no change in her fathers condition

## 2013-10-30 ENCOUNTER — Encounter: Payer: Self-pay | Admitting: Family Medicine

## 2013-11-23 ENCOUNTER — Other Ambulatory Visit: Payer: Self-pay | Admitting: Family Medicine

## 2013-11-23 ENCOUNTER — Ambulatory Visit: Payer: Medicare Other | Admitting: Family Medicine

## 2013-11-30 ENCOUNTER — Ambulatory Visit (INDEPENDENT_AMBULATORY_CARE_PROVIDER_SITE_OTHER): Payer: Medicare Other | Admitting: Family Medicine

## 2013-11-30 ENCOUNTER — Encounter: Payer: Self-pay | Admitting: Family Medicine

## 2013-11-30 VITALS — BP 130/80 | HR 76 | Temp 98.7°F | Resp 18 | Ht 62.0 in | Wt 146.0 lb

## 2013-11-30 DIAGNOSIS — R42 Dizziness and giddiness: Secondary | ICD-10-CM

## 2013-11-30 DIAGNOSIS — E119 Type 2 diabetes mellitus without complications: Secondary | ICD-10-CM

## 2013-11-30 DIAGNOSIS — I10 Essential (primary) hypertension: Secondary | ICD-10-CM

## 2013-11-30 DIAGNOSIS — E782 Mixed hyperlipidemia: Secondary | ICD-10-CM

## 2013-11-30 NOTE — Assessment & Plan Note (Signed)
Will plan for cholesterol check this week fasting. Liver function also to be checked

## 2013-11-30 NOTE — Assessment & Plan Note (Signed)
Recheck A1c 

## 2013-11-30 NOTE — Progress Notes (Addendum)
   Subjective:    Patient ID: Kenneth Potter, male    DOB: [birth year], 79 y.o.   MRN: [id number]  HPI Patient here to follow chronic medical problems. He has no specific concerns today. He does get dizzy at times typically first thing in the morning when he gets up. His appetite is fairly well. He is due for recheck on his A1c his last one was 6.8%. He still being cared for by his family members 24 hours a day.   Review of Systems   GEN- denies fatigue, fever, weight loss,weakness, recent illness HEENT- denies eye drainage, change in vision, nasal discharge, CVS- denies chest pain, palpitations RESP- denies SOB, cough, wheeze ABD- denies N/V, change in stools, abd pain GU- denies dysuria, hematuria, dribbling, incontinence MSK- denies joint pain, muscle aches, injury Neuro- denies headache,+ dizziness, syncope, seizure activity      Objective:   Physical Exam  GEN- NAD, alert and oriented x3 HEENT- PERRL, EOMI, non injected sclera, pink conjunctiva, MMM, oropharynx clear CVS- RRR, no murmur RESP-CTAB EXT- No edema Skin- Bilateral feet small hyperpigmented scabs, , dry flaky skin  Pulse- Radial and DP equal bilat Neuro- CNI-XII in tact, no focal deficits      Assessment & Plan:

## 2013-11-30 NOTE — Assessment & Plan Note (Signed)
Multifactorial but I think this is mostly due to his age. He has been evaluated by cardiology we also changed his medications in the past.

## 2013-11-30 NOTE — Assessment & Plan Note (Signed)
Blood pressure is well controlled, no signs of hypotension. I advised him to get his medications with meals

## 2013-11-30 NOTE — Patient Instructions (Signed)
Watch the sweets Get the labs done tomorrow morning Continue current medications Take BP medication with breakfast F/U 4 months

## 2013-12-01 LAB — HEMOGLOBIN A1C
Hgb A1c MFr Bld: 7.6 % — ABNORMAL HIGH (ref <5.7)
Mean Plasma Glucose: 171 mg/dL — ABNORMAL HIGH (ref <117)

## 2013-12-01 LAB — CBC
Hemoglobin: 14.8 g/dL (ref 13.0–17.0)
MCH: 31.9 pg (ref 26.0–34.0)
MCHC: 34.8 g/dL (ref 30.0–36.0)
Platelets: 208 10*3/uL (ref 150–400)

## 2013-12-01 LAB — BASIC METABOLIC PANEL WITH GFR
CO2: 27 mEq/L (ref 19–32)
Calcium: 9.2 mg/dL (ref 8.4–10.5)
Chloride: 105 mEq/L (ref 96–112)
Creat: 1.27 mg/dL (ref 0.50–1.35)
Glucose, Bld: 120 mg/dL — ABNORMAL HIGH (ref 70–99)
Sodium: 139 mEq/L (ref 135–145)

## 2013-12-01 LAB — LIPID PANEL
LDL Cholesterol: 72 mg/dL (ref 0–99)
Total CHOL/HDL Ratio: 3.4 Ratio
Triglycerides: 90 mg/dL (ref <150)
VLDL: 18 mg/dL (ref 0–40)

## 2013-12-10 ENCOUNTER — Encounter: Payer: Self-pay | Admitting: Cardiology

## 2013-12-10 ENCOUNTER — Ambulatory Visit (INDEPENDENT_AMBULATORY_CARE_PROVIDER_SITE_OTHER): Payer: Medicare Other | Admitting: Cardiology

## 2013-12-10 VITALS — BP 136/87 | HR 55 | Ht 64.0 in | Wt 146.0 lb

## 2013-12-10 DIAGNOSIS — R001 Bradycardia, unspecified: Secondary | ICD-10-CM

## 2013-12-10 DIAGNOSIS — I498 Other specified cardiac arrhythmias: Secondary | ICD-10-CM

## 2013-12-10 DIAGNOSIS — I10 Essential (primary) hypertension: Secondary | ICD-10-CM

## 2013-12-10 DIAGNOSIS — I2510 Atherosclerotic heart disease of native coronary artery without angina pectoris: Secondary | ICD-10-CM

## 2013-12-10 DIAGNOSIS — R42 Dizziness and giddiness: Secondary | ICD-10-CM
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
## 2013-12-10 NOTE — Patient Instructions (Signed)
Your physician recommends that you schedule a follow-up appointment in: 1 year with Dr. Wyline Mood. You should receive a letter in the mail in 10 months. If you do not receive this letter by October 2015 call our office to schedule this appointment.   Your physician recommends that you continue on your current medications as directed. Please refer to the Current Medication list given to you today.  You have been given a list of Primary care doctors today.

## 2014-01-08 ENCOUNTER — Telehealth: Payer: Self-pay | Admitting: *Deleted

## 2014-01-08 MED ORDER — SITAGLIPTIN PHOSPHATE 50 MG PO TABS: 50.0000 mg | ORAL_TABLET | Freq: Every day | ORAL | Status: DC

## 2014-01-08 NOTE — Telephone Encounter (Signed)
januvia ordered

## 2014-01-08 NOTE — Telephone Encounter (Signed)
Family wants to go ahead and start the Januvia that you had recommended

## 2014-01-11 ENCOUNTER — Ambulatory Visit: Payer: Medicare Other

## 2014-01-11 ENCOUNTER — Ambulatory Visit: Payer: Medicare Other | Admitting: Family Medicine

## 2014-01-13 ENCOUNTER — Ambulatory Visit: Payer: Medicare Other | Admitting: Family Medicine

## 2014-01-18 ENCOUNTER — Ambulatory Visit: Payer: Medicare Other | Admitting: Family Medicine

## 2014-01-25 ENCOUNTER — Ambulatory Visit: Payer: Medicare Other | Admitting: Family Medicine

## 2014-02-02 ENCOUNTER — Ambulatory Visit: Payer: Medicare Other | Admitting: Family Medicine

## 2014-02-09 ENCOUNTER — Ambulatory Visit: Payer: Medicare Other | Admitting: Family Medicine

## 2014-03-10 ENCOUNTER — Telehealth: Payer: Self-pay | Admitting: *Deleted

## 2014-03-10 ENCOUNTER — Ambulatory Visit: Payer: Medicare Other | Admitting: Family Medicine

## 2014-03-10 NOTE — Telephone Encounter (Signed)
Received call from patient daughter.   Reported that pt reports dizziness or "swimming in the head" when he wakes up in the morning.   States that it gets better after a few minutes, but it can happen if he sits down for long periods of time.   Reports that it is happening every morning when he wakes up.   MD please advise.   Call back number [phone].

## 2014-03-10 NOTE — Telephone Encounter (Signed)
Call placed to patient and patient made aware.   Appointment scheduled for 04/05/2014.

## 2014-03-10 NOTE — Telephone Encounter (Signed)
This is most likley due to drop in his blood pressure, he needs to sit for a moment in the morning before getting up, She can try to give him 1/2 tablet of amlodipine and continue all other medication Do this for 2 weeks if not better bring him in for visit

## 2014-03-24 ENCOUNTER — Other Ambulatory Visit: Payer: Self-pay | Admitting: Family Medicine

## 2014-03-25 NOTE — Telephone Encounter (Signed)
Refill appropriate and filled per protocol. 

## 2014-04-05 ENCOUNTER — Ambulatory Visit (INDEPENDENT_AMBULATORY_CARE_PROVIDER_SITE_OTHER): Payer: Medicare Other | Admitting: Family Medicine

## 2014-04-05 ENCOUNTER — Encounter: Payer: Self-pay | Admitting: Family Medicine

## 2014-04-05 VITALS — BP 132/88 | HR 60 | Temp 98.6°F | Resp 14 | Ht 62.0 in | Wt 139.0 lb

## 2014-04-05 DIAGNOSIS — R05 Cough: Secondary | ICD-10-CM

## 2014-04-05 DIAGNOSIS — G3184 Mild cognitive impairment, so stated: Secondary | ICD-10-CM

## 2014-04-05 DIAGNOSIS — R059 Cough, unspecified: Secondary | ICD-10-CM

## 2014-04-05 DIAGNOSIS — E785 Hyperlipidemia, unspecified: Secondary | ICD-10-CM

## 2014-04-05 DIAGNOSIS — E119 Type 2 diabetes mellitus without complications: Secondary | ICD-10-CM

## 2014-04-05 DIAGNOSIS — R634 Abnormal weight loss: Secondary | ICD-10-CM

## 2014-04-05 DIAGNOSIS — Z23 Encounter for immunization: Secondary | ICD-10-CM

## 2014-04-05 DIAGNOSIS — I10 Essential (primary) hypertension: Secondary | ICD-10-CM

## 2014-04-05 DIAGNOSIS — Z8546 Personal history of malignant neoplasm of prostate: Secondary | ICD-10-CM

## 2014-04-05 LAB — MICROALBUMIN / CREATININE URINE RATIO
Creatinine, Urine: 257 mg/dL
MICROALB UR: 5.28 mg/dL — AB (ref 0.00–1.89)
Microalb Creat Ratio: 20.5 mg/g (ref 0.0–30.0)

## 2014-04-05 LAB — HEMOGLOBIN A1C
HEMOGLOBIN A1C: 6.9 % — AB (ref <5.7)
Mean Plasma Glucose: 151 mg/dL — ABNORMAL HIGH (ref <117)

## 2014-04-05 LAB — CBC WITH DIFFERENTIAL/PLATELET
Basophils Absolute: 0.1 10*3/uL (ref 0.0–0.1)
Basophils Relative: 1 % (ref 0–1)
EOS PCT: 2 % (ref 0–5)
Eosinophils Absolute: 0.1 10*3/uL (ref 0.0–0.7)
HEMATOCRIT: 42.6 % (ref 39.0–52.0)
Hemoglobin: 15 g/dL (ref 13.0–17.0)
LYMPHS ABS: 1.2 10*3/uL (ref 0.7–4.0)
LYMPHS PCT: 18 % (ref 12–46)
MCH: 32.5 pg (ref 26.0–34.0)
MCHC: 35.2 g/dL (ref 30.0–36.0)
MCV: 92.2 fL (ref 78.0–100.0)
MONO ABS: 0.8 10*3/uL (ref 0.1–1.0)
Monocytes Relative: 12 % (ref 3–12)
Neutro Abs: 4.3 10*3/uL (ref 1.7–7.7)
Neutrophils Relative %: 67 % (ref 43–77)
Platelets: 193 10*3/uL (ref 150–400)
RBC: 4.62 MIL/uL (ref 4.22–5.81)
RDW: 13.9 % (ref 11.5–15.5)
WBC: 6.4 10*3/uL (ref 4.0–10.5)

## 2014-04-05 LAB — LIPID PANEL
Cholesterol: 144 mg/dL (ref 0–200)
HDL: 38 mg/dL — ABNORMAL LOW (ref >39)
LDL CALC: 85 mg/dL (ref 0–99)
TRIGLYCERIDES: 107 mg/dL (ref <150)
Total CHOL/HDL Ratio: 3.8 Ratio
VLDL: 21 mg/dL (ref 0–40)

## 2014-04-05 LAB — COMPREHENSIVE METABOLIC PANEL
ALT: 9 U/L (ref 0–53)
AST: 15 U/L (ref 0–37)
Albumin: 4 g/dL (ref 3.5–5.2)
Alkaline Phosphatase: 87 U/L (ref 39–117)
BUN: 11 mg/dL (ref 6–23)
CALCIUM: 9.2 mg/dL (ref 8.4–10.5)
CHLORIDE: 105 meq/L (ref 96–112)
CO2: 26 meq/L (ref 19–32)
CREATININE: 1.13 mg/dL (ref 0.50–1.35)
Glucose, Bld: 111 mg/dL — ABNORMAL HIGH (ref 70–99)
Potassium: 4.4 mEq/L (ref 3.5–5.3)
Sodium: 140 mEq/L (ref 135–145)
Total Bilirubin: 0.8 mg/dL (ref 0.2–1.2)
Total Protein: 7.1 g/dL (ref 6.0–8.3)

## 2014-04-05 NOTE — Assessment & Plan Note (Signed)
We'll check his A1c today continue Januvia the family was given a new prescription for the glucometer he can check his blood sugar 1-2 times a week or he's not feeling well Her goal be an A1c less than 8% based on his age. Prep and R. 13 as needed today this was given.

## 2014-04-05 NOTE — Progress Notes (Signed)
Patient ID: Kenneth Potter, male   DOB: 04-Mar-1926, 80 y.o.   MRN: [id number]      Subjective:    Patient ID: Kenneth Potter, male    DOB: 03-04-1926, 80 y.o.   MRN: [id number]  Patient presents for 4 month F/U and Cough  Patient here follow chronic medical problems. He's here with 2 of his daughters. He was in [place] for the past month. They do note that he's had a lot of dental work done of the past 2 months where he was unable to eat  Due to an  infection from his dentures. He has lost 7 pounds since her last visit but his appetite is starting to pick up now. He is no longer on any antibiotics.  He does complain of a cough that started last night he coughed up a little bit of yellow sputum he's also had some sneezing to. He's not had any fever shortness of breath or wheezing. They're not giving him anything over-the-counter  Diabetes mellitus he's taken Januvia once a day he did not take his blood sugar states that they need a new meter.  Overall he is doing fairly well. He's not had any recent falls. I did review his last cardiology note as well.  Review Of Systems:  GEN- denies fatigue, fever, weight loss,weakness, recent illness HEENT- denies eye drainage, change in vision, nasal discharge, CVS- denies chest pain, palpitations RESP- denies SOB, cough, wheeze ABD- denies N/V, change in stools, abd pain GU- denies dysuria, hematuria, dribbling, incontinence MSK- denies joint pain, muscle aches, injury Neuro- denies headache, dizziness, syncope, seizure activity       Objective:    BP 132/88  Pulse 60  Temp(Src) 98.6 F (37 C) (Oral)  Resp 14  Ht 5\' 2"  (1.575 m)  Wt 139 lb (63.05 kg)  BMI 25.42 kg/m2  GEN- NAD, alert and oriented x3 HEENT- PERRL, EOMI, non injected sclera, pink conjunctiva, MMM, oropharynx clear, TM clear right side, obscurred by wax left, canals clear, Nares clear, no maxillary sinus tenderness CVS- bradycardia, no  murmur RESP-CTAB ABD-NABS,soft,NT,ND EXT- No edema Skin- in tact LE Pulse- Radial and DP equal bilat         Assessment & Plan:      Problem List Items Addressed This Visit   History of prostate cancer   Relevant Orders      PSA, Medicare   Diabetes mellitus - Primary   Relevant Orders      CBC with Differential      Comprehensive metabolic panel      Hemoglobin A1c      Microalbumin / creatinine urine ratio      HM DIABETES FOOT EXAM (Completed)    Other Visit Diagnoses   Other and unspecified hyperlipidemia        Relevant Medications       amLODipine (NORVASC) 10 MG tablet    Other Relevant Orders       Lipid panel    Need for prophylactic vaccination against Streptococcus pneumoniae (pneumococcus)        Relevant Orders       Pneumococcal conjugate vaccine 13-valent (Completed)       Note: This dictation was prepared with Dragon dictation along with smaller phrase technology. Any transcriptional errors that result from this process are unintentional.

## 2014-04-05 NOTE — Patient Instructions (Signed)
Continue current medications Give Ensure twice a day New meter to be sent to [place] 13 shot given Okay to give robitussin as needed  Please put daughters on his contact list F/U 4 months

## 2014-04-05 NOTE — Assessment & Plan Note (Signed)
Blood pressure is well-controlled for his age continue current medications.

## 2014-04-05 NOTE — Assessment & Plan Note (Signed)
I'm concerned with his weight loss but he did have these recent dental procedures and had to be on medications for this. His appetite is starting to pick up. Otherwise his family to give him Ensure one can twice a day.

## 2014-04-05 NOTE — Assessment & Plan Note (Signed)
He's only had a cough for about 24 hours it could be an early viral illness most likely do to some allergies. Otherwise making him Robitussin at night the cough is bad. Regarding the PSA the family requested testing for this

## 2014-04-05 NOTE — Assessment & Plan Note (Signed)
He is overall doing very well.

## 2014-04-06 LAB — PSA, MEDICARE: PSA: 1.28 ng/mL (ref <=4.00)

## 2014-04-15 ENCOUNTER — Encounter (HOSPITAL_COMMUNITY): Payer: Self-pay | Admitting: Emergency Medicine

## 2014-04-15 ENCOUNTER — Emergency Department (HOSPITAL_COMMUNITY): Payer: Medicare Other

## 2014-04-15 ENCOUNTER — Emergency Department (HOSPITAL_COMMUNITY)
Admission: EM | Admit: 2014-04-15 | Discharge: 2014-04-15 | Disposition: A | Discharge status: Home / Self Care | Payer: Medicare Other | Attending: Emergency Medicine | Admitting: Emergency Medicine

## 2014-04-15 DIAGNOSIS — R42 Dizziness and giddiness: Secondary | ICD-10-CM | POA: Insufficient documentation

## 2014-04-15 DIAGNOSIS — E119 Type 2 diabetes mellitus without complications: Secondary | ICD-10-CM | POA: Insufficient documentation

## 2014-04-15 DIAGNOSIS — Z79899 Other long term (current) drug therapy: Secondary | ICD-10-CM | POA: Insufficient documentation

## 2014-04-15 DIAGNOSIS — J069 Acute upper respiratory infection, unspecified: Secondary | ICD-10-CM | POA: Insufficient documentation

## 2014-04-15 DIAGNOSIS — Z8546 Personal history of malignant neoplasm of prostate: Secondary | ICD-10-CM | POA: Insufficient documentation

## 2014-04-15 DIAGNOSIS — Z87891 Personal history of nicotine dependence: Secondary | ICD-10-CM | POA: Insufficient documentation

## 2014-04-15 DIAGNOSIS — E782 Mixed hyperlipidemia: Secondary | ICD-10-CM | POA: Insufficient documentation

## 2014-04-15 DIAGNOSIS — I2510 Atherosclerotic heart disease of native coronary artery without angina pectoris: Secondary | ICD-10-CM | POA: Insufficient documentation

## 2014-04-15 DIAGNOSIS — Z9889 Other specified postprocedural states: Secondary | ICD-10-CM | POA: Insufficient documentation

## 2014-04-15 DIAGNOSIS — I10 Essential (primary) hypertension: Secondary | ICD-10-CM | POA: Insufficient documentation

## 2014-04-15 LAB — CBC WITH DIFFERENTIAL/PLATELET
BASOS ABS: 0 10*3/uL (ref 0.0–0.1)
BASOS PCT: 0 % (ref 0–1)
Eosinophils Absolute: 0 10*3/uL (ref 0.0–0.7)
Eosinophils Relative: 0 % (ref 0–5)
HCT: 43.7 % (ref 39.0–52.0)
HEMOGLOBIN: 15.4 g/dL (ref 13.0–17.0)
Lymphocytes Relative: 9 % — ABNORMAL LOW (ref 12–46)
Lymphs Abs: 0.8 10*3/uL (ref 0.7–4.0)
MCH: 33.2 pg (ref 26.0–34.0)
MCHC: 35.2 g/dL (ref 30.0–36.0)
MCV: 94.2 fL (ref 78.0–100.0)
MONOS PCT: 4 % (ref 3–12)
Monocytes Absolute: 0.3 10*3/uL (ref 0.1–1.0)
NEUTROS ABS: 7.3 10*3/uL (ref 1.7–7.7)
NEUTROS PCT: 87 % — AB (ref 43–77)
PLATELETS: 163 10*3/uL (ref 150–400)
RBC: 4.64 MIL/uL (ref 4.22–5.81)
RDW: 13.1 % (ref 11.5–15.5)
WBC: 8.4 10*3/uL (ref 4.0–10.5)

## 2014-04-15 LAB — BASIC METABOLIC PANEL WITH GFR
BUN: 11 mg/dL (ref 6–23)
CO2: 24 meq/L (ref 19–32)
Calcium: 9.2 mg/dL (ref 8.4–10.5)
Chloride: 104 meq/L (ref 96–112)
Creatinine, Ser: 1.07 mg/dL (ref 0.50–1.35)
GFR calc Af Amer: 71 mL/min — ABNORMAL LOW
GFR calc non Af Amer: 61 mL/min — ABNORMAL LOW
Glucose, Bld: 185 mg/dL — ABNORMAL HIGH (ref 70–99)
Potassium: 4.3 meq/L (ref 3.7–5.3)
Sodium: 142 meq/L (ref 137–147)

## 2014-04-15 LAB — LACTIC ACID, PLASMA: LACTIC ACID, VENOUS: 1.9 mmol/L (ref 0.5–2.2)

## 2014-04-15 LAB — TROPONIN I

## 2014-04-15 LAB — PRO B NATRIURETIC PEPTIDE: Pro B Natriuretic peptide (BNP): 183 pg/mL (ref 0–450)

## 2014-04-15 MED ORDER — AZITHROMYCIN 250 MG PO TABS
500.0000 mg | ORAL_TABLET | Freq: Once | ORAL | Status: AC
  Administered 2014-04-15: 500 mg via ORAL
  Filled 2014-04-15: qty 2

## 2014-04-15 MED ORDER — AZITHROMYCIN 250 MG PO TABS: 250.0000 mg | ORAL_TABLET | Freq: Every day | ORAL | Status: AC

## 2014-04-15 MED ORDER — PREDNISONE 20 MG PO TABS: 40.0000 mg | ORAL_TABLET | Freq: Every day | ORAL | Status: AC

## 2014-04-15 MED ORDER — PREDNISONE 20 MG PO TABS
40.0000 mg | ORAL_TABLET | Freq: Once | ORAL | Status: AC
  Administered 2014-04-15: 40 mg via ORAL
  Filled 2014-04-15: qty 2

## 2014-04-15 NOTE — Discharge Instructions (Signed)
As discussed, it is important that you follow up as soon as possible with your physician for continued management of your condition.  If you develop any new, or concerning changes in your condition, please return to the emergency department immediately.  Cough, Adult  A cough is a reflex. It helps you clear your throat and airways. A cough can help heal your body. A cough can last 2 or 3 weeks (acute) or may last more than 8 weeks (chronic). Some common causes of a cough can include an infection, allergy, or a cold. HOME CARE  Only take medicine as told by your doctor.  If given, take your medicines (antibiotics) as told. Finish them even if you start to feel better.  Use a cold steam vaporizer or humidier in your home. This can help loosen thick spit (secretions).  Sleep so you are almost sitting up (semi-upright). Use pillows to do this. This helps reduce coughing.  Rest as needed.  Stop smoking if you smoke. GET HELP RIGHT AWAY IF:  You have yellowish-white fluid (pus) in your thick spit.  Your cough gets worse.  Your medicine does not reduce coughing, and you are losing sleep.  You cough up blood.  You have trouble breathing.  Your pain gets worse and medicine does not help.  You have a fever. MAKE SURE YOU:   Understand these instructions.  Will watch your condition.  Will get help right away if you are not doing well or get worse. Document Released: 08/30/2011 Document Revised: 03/10/2012 Document Reviewed: 08/30/2011 [facility] Patient Information 2014 [place].

## 2014-04-15 NOTE — ED Notes (Signed)
Pt reports an "episode" of cp this a.m when he first woke up.  Pt describes pain 8/10 that felt like an "ache".  Pt also reports some sob during the occurrence.  Pt denies any cp at this time.

## 2014-04-15 NOTE — ED Notes (Signed)
Ordered pt sandwich from cafeteria.  Family reports that pt was unable to take meds on an empty stomach.

## 2014-04-15 NOTE — ED Notes (Signed)
Per ems, pt reports feeling weak, dizzy and c/o cough for the past few days.  Pt reports " i feel worse today".  Pt a&o x4.  Pt also reports decreased appetite for the past few days.
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
## 2014-04-15 NOTE — ED Notes (Signed)
Patient given discharge instruction, verbalized understand. IV removed, band aid applied. Patient in wheelchar out of the department with tech.

## 2014-07-26 ENCOUNTER — Encounter (HOSPITAL_COMMUNITY): Payer: Self-pay | Admitting: Emergency Medicine

## 2014-07-26 ENCOUNTER — Ambulatory Visit: Payer: Self-pay | Admitting: Physician Assistant

## 2014-07-26 ENCOUNTER — Telehealth: Payer: Self-pay | Admitting: *Deleted

## 2014-07-26 ENCOUNTER — Inpatient Hospital Stay (HOSPITAL_COMMUNITY)
Admission: EM | Admit: 2014-07-26 | Discharge: 2014-08-04 | DRG: 417 | Disposition: A | Discharge status: Skilled Nursing Facility | Payer: Medicare Other | Attending: Internal Medicine | Admitting: Internal Medicine

## 2014-07-26 ENCOUNTER — Emergency Department (HOSPITAL_COMMUNITY): Payer: Medicare Other

## 2014-07-26 DIAGNOSIS — E1129 Type 2 diabetes mellitus with other diabetic kidney complication: Secondary | ICD-10-CM | POA: Diagnosis present

## 2014-07-26 DIAGNOSIS — R945 Abnormal results of liver function studies: Secondary | ICD-10-CM | POA: Diagnosis present

## 2014-07-26 DIAGNOSIS — G0491 Myelitis, unspecified: Secondary | ICD-10-CM

## 2014-07-26 DIAGNOSIS — F411 Generalized anxiety disorder: Secondary | ICD-10-CM | POA: Diagnosis present

## 2014-07-26 DIAGNOSIS — R339 Retention of urine, unspecified: Secondary | ICD-10-CM | POA: Diagnosis not present

## 2014-07-26 DIAGNOSIS — E119 Type 2 diabetes mellitus without complications: Secondary | ICD-10-CM | POA: Diagnosis present

## 2014-07-26 DIAGNOSIS — R7989 Other specified abnormal findings of blood chemistry: Secondary | ICD-10-CM | POA: Diagnosis present

## 2014-07-26 DIAGNOSIS — Z885 Allergy status to narcotic agent status: Secondary | ICD-10-CM

## 2014-07-26 DIAGNOSIS — I10 Essential (primary) hypertension: Secondary | ICD-10-CM | POA: Diagnosis present

## 2014-07-26 DIAGNOSIS — Z87891 Personal history of nicotine dependence: Secondary | ICD-10-CM | POA: Diagnosis not present

## 2014-07-26 DIAGNOSIS — Z79899 Other long term (current) drug therapy: Secondary | ICD-10-CM

## 2014-07-26 DIAGNOSIS — Z01810 Encounter for preprocedural cardiovascular examination: Secondary | ICD-10-CM | POA: Diagnosis not present

## 2014-07-26 DIAGNOSIS — Z8546 Personal history of malignant neoplasm of prostate: Secondary | ICD-10-CM

## 2014-07-26 DIAGNOSIS — K8000 Calculus of gallbladder with acute cholecystitis without obstruction: Secondary | ICD-10-CM | POA: Diagnosis not present

## 2014-07-26 DIAGNOSIS — K660 Peritoneal adhesions (postprocedural) (postinfection): Secondary | ICD-10-CM | POA: Diagnosis present

## 2014-07-26 DIAGNOSIS — Z886 Allergy status to analgesic agent status: Secondary | ICD-10-CM

## 2014-07-26 DIAGNOSIS — G9340 Encephalopathy, unspecified: Secondary | ICD-10-CM | POA: Diagnosis present

## 2014-07-26 DIAGNOSIS — N390 Urinary tract infection, site not specified: Secondary | ICD-10-CM

## 2014-07-26 DIAGNOSIS — R259 Unspecified abnormal involuntary movements: Secondary | ICD-10-CM | POA: Diagnosis present

## 2014-07-26 DIAGNOSIS — Q619 Cystic kidney disease, unspecified: Secondary | ICD-10-CM

## 2014-07-26 DIAGNOSIS — A498 Other bacterial infections of unspecified site: Secondary | ICD-10-CM | POA: Diagnosis present

## 2014-07-26 DIAGNOSIS — K5900 Constipation, unspecified: Secondary | ICD-10-CM | POA: Diagnosis not present

## 2014-07-26 DIAGNOSIS — F0390 Unspecified dementia without behavioral disturbance: Secondary | ICD-10-CM | POA: Diagnosis present

## 2014-07-26 DIAGNOSIS — A419 Sepsis, unspecified organism: Secondary | ICD-10-CM

## 2014-07-26 DIAGNOSIS — I080 Rheumatic disorders of both mitral and aortic valves: Secondary | ICD-10-CM | POA: Diagnosis present

## 2014-07-26 DIAGNOSIS — R7402 Elevation of levels of lactic acid dehydrogenase (LDH): Secondary | ICD-10-CM | POA: Diagnosis present

## 2014-07-26 DIAGNOSIS — I079 Rheumatic tricuspid valve disease, unspecified: Secondary | ICD-10-CM | POA: Diagnosis present

## 2014-07-26 DIAGNOSIS — R569 Unspecified convulsions: Secondary | ICD-10-CM | POA: Diagnosis present

## 2014-07-26 DIAGNOSIS — I2510 Atherosclerotic heart disease of native coronary artery without angina pectoris: Secondary | ICD-10-CM | POA: Diagnosis present

## 2014-07-26 DIAGNOSIS — K7689 Other specified diseases of liver: Secondary | ICD-10-CM | POA: Diagnosis present

## 2014-07-26 DIAGNOSIS — R740 Nonspecific elevation of levels of transaminase and lactic acid dehydrogenase [LDH]: Secondary | ICD-10-CM

## 2014-07-26 DIAGNOSIS — R42 Dizziness and giddiness: Secondary | ICD-10-CM

## 2014-07-26 DIAGNOSIS — K810 Acute cholecystitis: Secondary | ICD-10-CM

## 2014-07-26 DIAGNOSIS — E782 Mixed hyperlipidemia: Secondary | ICD-10-CM

## 2014-07-26 DIAGNOSIS — G0490 Encephalitis and encephalomyelitis, unspecified: Secondary | ICD-10-CM | POA: Diagnosis present

## 2014-07-26 DIAGNOSIS — R7401 Elevation of levels of liver transaminase levels: Secondary | ICD-10-CM | POA: Diagnosis present

## 2014-07-26 DIAGNOSIS — Z903 Acquired absence of stomach [part of]: Secondary | ICD-10-CM | POA: Diagnosis not present

## 2014-07-26 DIAGNOSIS — R7881 Bacteremia: Secondary | ICD-10-CM

## 2014-07-26 DIAGNOSIS — E876 Hypokalemia: Secondary | ICD-10-CM | POA: Diagnosis not present

## 2014-07-26 DIAGNOSIS — R1011 Right upper quadrant pain: Secondary | ICD-10-CM

## 2014-07-26 DIAGNOSIS — R4182 Altered mental status, unspecified: Secondary | ICD-10-CM | POA: Diagnosis present

## 2014-07-26 DIAGNOSIS — K219 Gastro-esophageal reflux disease without esophagitis: Secondary | ICD-10-CM | POA: Diagnosis present

## 2014-07-26 DIAGNOSIS — I498 Other specified cardiac arrhythmias: Secondary | ICD-10-CM | POA: Diagnosis present

## 2014-07-26 HISTORY — DX: Malignant neoplasm of prostate: C61

## 2014-07-26 HISTORY — DX: Gastro-esophageal reflux disease without esophagitis: K21.9

## 2014-07-26 HISTORY — DX: Unspecified convulsions: R56.9

## 2014-07-26 HISTORY — DX: Anxiety disorder, unspecified: F41.9

## 2014-07-26 LAB — URINALYSIS, ROUTINE W REFLEX MICROSCOPIC
Glucose, UA: NEGATIVE mg/dL
Ketones, ur: 15 mg/dL — AB
Leukocytes, UA: NEGATIVE
Nitrite: POSITIVE — AB
Protein, ur: 100 mg/dL — AB
Specific Gravity, Urine: 1.02 (ref 1.005–1.030)
Urobilinogen, UA: >8 mg/dL — ABNORMAL HIGH (ref 0.0–1.0)
pH: 6 (ref 5.0–8.0)

## 2014-07-26 LAB — BASIC METABOLIC PANEL
Anion gap: 17 — ABNORMAL HIGH (ref 5–15)
BUN: 19 mg/dL (ref 6–23)
CO2: 21 mEq/L (ref 19–32)
Calcium: 9.1 mg/dL (ref 8.4–10.5)
Chloride: 101 mEq/L (ref 96–112)
Creatinine, Ser: 1.3 mg/dL (ref 0.50–1.35)
GFR calc Af Amer: 56 mL/min — ABNORMAL LOW (ref >90)
GFR calc non Af Amer: 48 mL/min — ABNORMAL LOW (ref >90)
Glucose, Bld: 169 mg/dL — ABNORMAL HIGH (ref 70–99)
Potassium: 3.6 mEq/L — ABNORMAL LOW (ref 3.7–5.3)
Sodium: 139 mEq/L (ref 137–147)

## 2014-07-26 LAB — CBC WITH DIFFERENTIAL/PLATELET
Basophils Absolute: 0 10*3/uL (ref 0.0–0.1)
Basophils Relative: 0 % (ref 0–1)
Eosinophils Absolute: 0 10*3/uL (ref 0.0–0.7)
Eosinophils Relative: 0 % (ref 0–5)
HCT: 39.5 % (ref 39.0–52.0)
Hemoglobin: 14.3 g/dL (ref 13.0–17.0)
Lymphocytes Relative: 5 % — ABNORMAL LOW (ref 12–46)
Lymphs Abs: 0.6 10*3/uL — ABNORMAL LOW (ref 0.7–4.0)
MCH: 33.5 pg (ref 26.0–34.0)
MCHC: 36.2 g/dL — ABNORMAL HIGH (ref 30.0–36.0)
MCV: 92.5 fL (ref 78.0–100.0)
Monocytes Absolute: 0.9 10*3/uL (ref 0.1–1.0)
Monocytes Relative: 7 % (ref 3–12)
Neutro Abs: 10.3 10*3/uL — ABNORMAL HIGH (ref 1.7–7.7)
Neutrophils Relative %: 88 % — ABNORMAL HIGH (ref 43–77)
Platelets: 162 10*3/uL (ref 150–400)
RBC: 4.27 MIL/uL (ref 4.22–5.81)
RDW: 13.7 % (ref 11.5–15.5)
WBC: 11.8 10*3/uL — ABNORMAL HIGH (ref 4.0–10.5)

## 2014-07-26 LAB — LACTIC ACID, PLASMA: Lactic Acid, Venous: 3.7 mmol/L — ABNORMAL HIGH (ref 0.5–2.2)

## 2014-07-26 LAB — HEPATIC FUNCTION PANEL
ALT: 170 U/L — ABNORMAL HIGH (ref 0–53)
AST: 180 U/L — ABNORMAL HIGH (ref 0–37)
Albumin: 3.5 g/dL (ref 3.5–5.2)
Alkaline Phosphatase: 132 U/L — ABNORMAL HIGH (ref 39–117)
Bilirubin, Direct: 0.9 mg/dL — ABNORMAL HIGH (ref 0.0–0.3)
Indirect Bilirubin: 1.2 mg/dL — ABNORMAL HIGH (ref 0.3–0.9)
Total Bilirubin: 2.1 mg/dL — ABNORMAL HIGH (ref 0.3–1.2)
Total Protein: 7.2 g/dL (ref 6.0–8.3)

## 2014-07-26 LAB — URINE MICROSCOPIC-ADD ON

## 2014-07-26 LAB — LIPASE, BLOOD: Lipase: 9 U/L — ABNORMAL LOW (ref 11–59)

## 2014-07-26 MED ORDER — INSULIN ASPART 100 UNIT/ML ~~LOC~~ SOLN: 0.0000 [IU] | Freq: Every day | SUBCUTANEOUS | Status: DC

## 2014-07-26 MED ORDER — LINAGLIPTIN 5 MG PO TABS
5.0000 mg | ORAL_TABLET | Freq: Every day | ORAL | Status: DC
  Administered 2014-07-27: 5 mg via ORAL
  Filled 2014-07-26 (×4): qty 1

## 2014-07-26 MED ORDER — PIPERACILLIN-TAZOBACTAM 3.375 G IVPB 30 MIN
3.3750 g | Freq: Once | INTRAVENOUS | Status: AC
  Administered 2014-07-26: 3.375 g via INTRAVENOUS
  Filled 2014-07-26: qty 50

## 2014-07-26 MED ORDER — ONDANSETRON HCL 4 MG PO TABS: 4.0000 mg | ORAL_TABLET | Freq: Four times a day (QID) | ORAL | Status: DC | PRN

## 2014-07-26 MED ORDER — ONDANSETRON HCL 4 MG/2ML IJ SOLN: 4.0000 mg | Freq: Four times a day (QID) | INTRAMUSCULAR | Status: DC | PRN

## 2014-07-26 MED ORDER — SODIUM CHLORIDE 0.9 % IV SOLN
INTRAVENOUS | Status: AC
  Administered 2014-07-27 – 2014-07-28 (×2): via INTRAVENOUS

## 2014-07-26 MED ORDER — AMLODIPINE BESYLATE 10 MG PO TABS
10.0000 mg | ORAL_TABLET | Freq: Every day | ORAL | Status: DC
  Administered 2014-07-27 – 2014-08-04 (×9): 10 mg via ORAL
  Filled 2014-07-26: qty 2
  Filled 2014-07-26 (×5): qty 1
  Filled 2014-07-26: qty 2
  Filled 2014-07-26: qty 1
  Filled 2014-07-26: qty 2

## 2014-07-26 MED ORDER — HEPARIN SODIUM (PORCINE) 5000 UNIT/ML IJ SOLN
5000.0000 [IU] | Freq: Three times a day (TID) | INTRAMUSCULAR | Status: DC
  Administered 2014-07-27 – 2014-07-29 (×7): 5000 [IU] via SUBCUTANEOUS
  Filled 2014-07-26 (×7): qty 1

## 2014-07-26 MED ORDER — LISINOPRIL 10 MG PO TABS
10.0000 mg | ORAL_TABLET | Freq: Two times a day (BID) | ORAL | Status: DC
  Administered 2014-07-27 (×2): 10 mg via ORAL
  Filled 2014-07-26 (×3): qty 1

## 2014-07-26 MED ORDER — INSULIN ASPART 100 UNIT/ML ~~LOC~~ SOLN
0.0000 [IU] | Freq: Three times a day (TID) | SUBCUTANEOUS | Status: DC
  Administered 2014-07-27: 2 [IU] via SUBCUTANEOUS
  Administered 2014-07-28: 3 [IU] via SUBCUTANEOUS
  Administered 2014-07-31 – 2014-08-04 (×10): 2 [IU] via SUBCUTANEOUS

## 2014-07-26 MED ORDER — SODIUM CHLORIDE 0.9 % IV BOLUS (SEPSIS)
1000.0000 mL | Freq: Once | INTRAVENOUS | Status: AC
  Administered 2014-07-26: 1000 mL via INTRAVENOUS

## 2014-07-26 MED ORDER — PIPERACILLIN-TAZOBACTAM 3.375 G IVPB
3.3750 g | Freq: Once | INTRAVENOUS | Status: AC
  Administered 2014-07-27: 3.375 g via INTRAVENOUS
  Filled 2014-07-26: qty 50

## 2014-07-26 MED ORDER — PIPERACILLIN-TAZOBACTAM 3.375 G IVPB
3.3750 g | Freq: Once | INTRAVENOUS | Status: DC
  Filled 2014-07-26: qty 50

## 2014-07-26 MED ORDER — ACETAMINOPHEN 650 MG RE SUPP
650.0000 mg | Freq: Once | RECTAL | Status: AC
  Administered 2014-07-26: 650 mg via RECTAL
  Filled 2014-07-26: qty 1

## 2014-07-26 MED ORDER — NITROGLYCERIN 0.4 MG SL SUBL: 0.4000 mg | SUBLINGUAL_TABLET | SUBLINGUAL | Status: DC | PRN

## 2014-07-26 MED ORDER — VANCOMYCIN HCL IN DEXTROSE 1-5 GM/200ML-% IV SOLN
1000.0000 mg | Freq: Once | INTRAVENOUS | Status: AC
  Administered 2014-07-26: 1000 mg via INTRAVENOUS
  Filled 2014-07-26: qty 200

## 2014-07-26 MED ORDER — SIMVASTATIN 20 MG PO TABS
20.0000 mg | ORAL_TABLET | Freq: Every day | ORAL | Status: DC
Start: 2014-07-26 — End: 2014-08-04
  Administered 2014-07-27 – 2014-08-03 (×7): 20 mg via ORAL
  Filled 2014-07-26 (×12): qty 1

## 2014-07-26 MED ORDER — PANTOPRAZOLE SODIUM 40 MG PO TBEC
40.0000 mg | DELAYED_RELEASE_TABLET | Freq: Every day | ORAL | Status: DC
  Administered 2014-07-27: 40 mg via ORAL
  Filled 2014-07-26 (×2): qty 1
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
## 2014-07-26 NOTE — ED Notes (Signed)
Patient resting in a position of comfort. Family at bedside, no needs voiced at this time.

## 2014-07-26 NOTE — Telephone Encounter (Signed)
Noted, appt but pt canceled, also told to go to UC if family could not bring him

## 2014-07-26 NOTE — ED Notes (Signed)
Pt vomiting all night and confused and has became incontinent.
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
## 2014-07-26 NOTE — ED Notes (Signed)
Patient resting in bed at this time, family at bedside. No needs voiced. 

## 2014-07-26 NOTE — ED Notes (Signed)
Patient via POV. Per family members patient was last seen normal 07/25/14 1900. Family states patient started vomiting at midnight, and at 0300 patient became lethargic, confused, and incontinent. Upon assessment patient is alert to self, but confused of place, situation, and time. Patient able to follow some commands. Patient is grunting in response to questions, and nodding head up and down to questions.

## 2014-07-26 NOTE — Telephone Encounter (Signed)
Received call from patient daughter.  Reports that patient noted to be weak and lethargic today. States that he was at his baseline on 07/25/2014 in the morning, but as the day progressed, he became notably more weak and lethargic. Reports that he is showing some increased confusion and has now had a few episodes of incontinence.   Reports that facial features are symmetrical, and hand strengths normal. States that no S/Sx of acute distress noted.   Appointment scheduled with PA for 07/26/2014.

## 2014-07-26 NOTE — Progress Notes (Signed)
ANTIBIOTIC CONSULT NOTE-Preliminary  Pharmacy Consult for Zosyn Indication: Intra-abdominal infection  Allergies  Allergen Reactions  . Codeine   . Aspirin     Can not tolerate in high dosage    Patient Measurements: Height: 5\' 2"  (157.5 cm) Weight: 137 lb 2 oz (62.2 kg) IBW/kg (Calculated) : 54.6  Vital Signs: Temp: 100.1 F (37.8 C) (07/27 2327) Temp src: Oral (07/27 2327) BP: 173/66 mmHg (07/27 2327) Pulse Rate: 84 (07/27 2327)  Labs:  Recent Labs  07/26/14 1929  WBC 11.8*  HGB 14.3  PLT 162  CREATININE 1.30    Estimated Creatinine Clearance: 31.5 ml/min (by C-G formula based on Cr of 1.3).  No results found for this basename: VANCOTROUGH, VANCOPEAK, VANCORANDOM, GENTTROUGH, GENTPEAK, GENTRANDOM, TOBRATROUGH, TOBRAPEAK, TOBRARND, AMIKACINPEAK, AMIKACINTROU, AMIKACIN,  in the last 72 hours   Microbiology: Recent Results (from the past 720 hour(s))  CULTURE, BLOOD (ROUTINE X 2)     Status: None   Collection Time    07/26/14  8:11 PM      Result Value Ref Range Status   Specimen Description BLOOD LEFT FOREARM   Final   Special Requests BOTTLES DRAWN AEROBIC AND ANAEROBIC [place]   Final   Culture PENDING   Incomplete   Report Status PENDING   Incomplete  CULTURE, BLOOD (ROUTINE X 2)     Status: None   Collection Time    07/26/14  8:11 PM      Result Value Ref Range Status   Specimen Description BLOOD LEFT ANTECUBITAL   Final   Special Requests     Final   Value: BOTTLES DRAWN AEROBIC AND ANAEROBIC AEB 6CC ANA 4CC   Culture PENDING   Incomplete   Report Status PENDING   Incomplete    Medical History: Past Medical History  Diagnosis Date  . Essential hypertension, benign   . Mixed hyperlipidemia   . Near syncope   . Chest pain     a. Felt noncardiac 06/2012 - ruled out for MI, cardiac cath with nonobstructive CAD.  [name] CAD (coronary artery disease)     Nonobstructive by cath 06/2012  . Bradycardia   . Diabetes mellitus without complication   . Cancer      Prostate cancer    Medications:  Vancomycin 1 Gm IV in the ED Zosyn 3.375 Gm IV in the ED at @2000  07/26/14  Assessment: 81 yo male seen in the ED with nausea/vomiting and altered mental status. Pt has elevated LFTs and WBCs; abnormal urinalysis. Empiric antibiotics for intra-abdominal infection; GI consult.  Goal of Therapy:  Eradicate infection  Plan:  Preliminary review of pertinent patient information completed.  Protocol will be initiated with a one-time dose of Zosyn 3.375 Gm IV @6  hours after the initial 30 minute infusion given in the ED.  Forestine Na clinical pharmacist will complete review during morning rounds to assess patient and finalize treatment regimen.  Norberto Sorenson, [facility] 07/26/2014,11:37 PM

## 2014-07-27 ENCOUNTER — Inpatient Hospital Stay (HOSPITAL_COMMUNITY): Payer: Medicare Other

## 2014-07-27 ENCOUNTER — Encounter (HOSPITAL_COMMUNITY): Payer: Self-pay | Admitting: Gastroenterology

## 2014-07-27 DIAGNOSIS — A419 Sepsis, unspecified organism: Secondary | ICD-10-CM

## 2014-07-27 DIAGNOSIS — R7881 Bacteremia: Secondary | ICD-10-CM | POA: Diagnosis present

## 2014-07-27 DIAGNOSIS — R42 Dizziness and giddiness: Secondary | ICD-10-CM

## 2014-07-27 DIAGNOSIS — E119 Type 2 diabetes mellitus without complications: Secondary | ICD-10-CM

## 2014-07-27 LAB — COMPREHENSIVE METABOLIC PANEL
ALT: 131 U/L — AB (ref 0–53)
AST: 114 U/L — AB (ref 0–37)
Albumin: 3.3 g/dL — ABNORMAL LOW (ref 3.5–5.2)
Alkaline Phosphatase: 132 U/L — ABNORMAL HIGH (ref 39–117)
Anion gap: 13 (ref 5–15)
BILIRUBIN TOTAL: 2.1 mg/dL — AB (ref 0.3–1.2)
BUN: 16 mg/dL (ref 6–23)
CALCIUM: 8.9 mg/dL (ref 8.4–10.5)
CHLORIDE: 105 meq/L (ref 96–112)
CO2: 23 meq/L (ref 19–32)
CREATININE: 1.18 mg/dL (ref 0.50–1.35)
GFR calc non Af Amer: 54 mL/min — ABNORMAL LOW (ref >90)
GFR, EST AFRICAN AMERICAN: 63 mL/min — AB (ref >90)
GLUCOSE: 113 mg/dL — AB (ref 70–99)
Potassium: 3.4 mEq/L — ABNORMAL LOW (ref 3.7–5.3)
SODIUM: 141 meq/L (ref 137–147)
Total Protein: 6.8 g/dL (ref 6.0–8.3)

## 2014-07-27 LAB — CBC
HCT: 39.4 % (ref 39.0–52.0)
Hemoglobin: 14 g/dL (ref 13.0–17.0)
MCH: 33.1 pg (ref 26.0–34.0)
MCHC: 35.5 g/dL (ref 30.0–36.0)
MCV: 93.1 fL (ref 78.0–100.0)
PLATELETS: 159 10*3/uL (ref 150–400)
RBC: 4.23 MIL/uL (ref 4.22–5.81)
RDW: 13.7 % (ref 11.5–15.5)
WBC: 10.9 10*3/uL — ABNORMAL HIGH (ref 4.0–10.5)

## 2014-07-27 LAB — GLUCOSE, CAPILLARY
GLUCOSE-CAPILLARY: 122 mg/dL — AB (ref 70–99)
Glucose-Capillary: 118 mg/dL — ABNORMAL HIGH (ref 70–99)
Glucose-Capillary: 129 mg/dL — ABNORMAL HIGH (ref 70–99)
Glucose-Capillary: 123 mg/dL — ABNORMAL HIGH (ref 70–99)

## 2014-07-27 MED ORDER — TECHNETIUM TC 99M MEBROFENIN IV KIT
5.0000 | PACK | Freq: Once | INTRAVENOUS | Status: AC | PRN
  Administered 2014-07-27: 5 via INTRAVENOUS

## 2014-07-27 MED ORDER — PIPERACILLIN-TAZOBACTAM 3.375 G IVPB
3.3750 g | Freq: Three times a day (TID) | INTRAVENOUS | Status: DC
  Administered 2014-07-27 – 2014-07-31 (×11): 3.375 g via INTRAVENOUS
  Filled 2014-07-27 (×16): qty 50

## 2014-07-27 MED ORDER — STERILE WATER FOR INJECTION IJ SOLN
INTRAMUSCULAR | Status: AC
  Administered 2014-07-27: 0.75 mL via INTRAVENOUS
  Filled 2014-07-27: qty 10

## 2014-07-27 MED ORDER — ACETAMINOPHEN 500 MG PO TABS
500.0000 mg | ORAL_TABLET | Freq: Four times a day (QID) | ORAL | Status: DC | PRN
  Administered 2014-07-27 (×2): 500 mg via ORAL
  Filled 2014-07-27 (×3): qty 1

## 2014-07-27 MED ORDER — PIPERACILLIN-TAZOBACTAM 3.375 G IVPB
INTRAVENOUS | Status: AC
  Filled 2014-07-27: qty 50

## 2014-07-27 MED ORDER — STERILE WATER FOR INJECTION IJ SOLN
INTRAMUSCULAR | Status: AC
  Administered 2014-07-27: 1.25 mL via INTRAVENOUS
  Filled 2014-07-27: qty 10

## 2014-07-27 MED ORDER — SINCALIDE 5 MCG IJ SOLR
INTRAMUSCULAR | Status: AC
Start: 2014-07-27 — End: 2014-07-27
  Administered 2014-07-27: 1.25 ug via INTRAVENOUS
  Filled 2014-07-27: qty 5

## 2014-07-27 NOTE — Care Management Utilization Note (Signed)
UR completed 

## 2014-07-27 NOTE — Progress Notes (Signed)
ANTIBIOTIC CONSULT NOTE-Preliminary  Pharmacy Consult for Zosyn Indication: Intra-abdominal infection  Allergies  Allergen Reactions  . Codeine   . Aspirin     Can not tolerate in high dosage   Patient Measurements: Height: 5\' 2"  (157.5 cm) Weight: 137 lb 2 oz (62.2 kg) IBW/kg (Calculated) : 54.6  Vital Signs: Temp: 99.5 F (37.5 C) (07/28 0622) Temp src: Oral (07/28 0622) BP: 101/58 mmHg (07/28 0622) Pulse Rate: 68 (07/28 0622)  Labs:  Recent Labs  07/26/14 1929 07/27/14 0516  WBC 11.8* 10.9*  HGB 14.3 14.0  PLT 162 159  CREATININE 1.30 1.18   Estimated Creatinine Clearance: 34.7 ml/min (by C-G formula based on Cr of 1.18).  No results found for this basename: VANCOTROUGH, VANCOPEAK, VANCORANDOM, GENTTROUGH, GENTPEAK, GENTRANDOM, TOBRATROUGH, TOBRAPEAK, TOBRARND, AMIKACINPEAK, AMIKACINTROU, AMIKACIN,  in the last 72 hours   Microbiology: Recent Results (from the past 720 hour(s))  CULTURE, BLOOD (ROUTINE X 2)     Status: None   Collection Time    07/26/14  8:11 PM      Result Value Ref Range Status   Specimen Description BLOOD LEFT FOREARM   Final   Special Requests BOTTLES DRAWN AEROBIC AND ANAEROBIC [place]   Final   Culture PENDING   Incomplete   Report Status PENDING   Incomplete  CULTURE, BLOOD (ROUTINE X 2)     Status: None   Collection Time    07/26/14  8:11 PM      Result Value Ref Range Status   Specimen Description BLOOD LEFT ANTECUBITAL   Final   Special Requests     Final   Value: BOTTLES DRAWN AEROBIC AND ANAEROBIC AEB 6CC ANA 4CC   Culture PENDING   Incomplete   Report Status PENDING   Incomplete   Medical History: Past Medical History  Diagnosis Date  . Essential hypertension, benign   . Mixed hyperlipidemia   . Near syncope   . Chest pain     a. Felt noncardiac 06/2012 - ruled out for MI, cardiac cath with nonobstructive CAD.  [name] CAD (coronary artery disease)     Nonobstructive by cath 06/2012  . Bradycardia   . Diabetes mellitus  without complication   . Cancer     Prostate cancer   Medications:  Vancomycin 1 Gm IV in the ED 7/27 Zosyn 3.375 Gm IV in the ED 07/26/14  Assessment: 80 yo male seen in the ED with nausea/vomiting and altered mental status. Pt has elevated LFTs and WBCs; abnormal urinalysis. Empiric antibiotics for intra-abdominal infection; GI consult.  Estimated Creatinine Clearance: 34.7 ml/min (by C-G formula based on Cr of 1.18).  Goal of Therapy:  Eradicate infection  Plan:  Zosyn 3.375gm IV q8h, each dose over 4 hrs Monitor labs, progress, renal fxn, and cultures  Hart Robinsons A, RPH 07/27/2014,7:41 AM

## 2014-07-27 NOTE — Telephone Encounter (Signed)
Patient admitted to [facility] with Dx: encephalopathy, sepsis, UTI.
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
## 2014-07-27 NOTE — Progress Notes (Signed)
Bladder scan indicates 300cc urine in bladder, comdon cath in place with dark colored urine noted.

## 2014-07-27 NOTE — Progress Notes (Signed)
CARE MANAGEMENT NOTE 07/27/2014  Patient:  Kenneth Potter, Kenneth Potter   Account Number:  [id number]  Date Initiated:  07/27/2014  Documentation initiated by:  Maie Kesinger  Subjective/Objective Assessment:   Pt admitted with AMS. Pt lives with his daughter who assists with ADL's. Pt has shower chair and walker for PRN use.     Action/Plan:   PT consult obtained. Will continue to follow for discharge planning needs.   Anticipated DC Date:     Anticipated DC Plan:        DC Planning Services  CM consult      Choice offered to / List presented to:             Status of service:  In process, will continue to follow Medicare Important Message given?   (If response is "NO", the following Medicare IM given date fields will be blank) Date Medicare IM given:   Medicare IM given by:   Date Additional Medicare IM given:   Additional Medicare IM given by:    Discharge Disposition:  HOME/SELF CARE  Per UR Regulation:    If discussed at Long Length of Stay Meetings, dates discussed:    Comments:  07/27/2014 [place], RN, MSN, [facility]

## 2014-07-27 NOTE — Progress Notes (Signed)
[facility] PROGRESS NOTE   Kenneth Potter LFY:[id number] DOB: 05-May-1926 DOA: 07/26/2014 PCP: [name], MD  HPI/Subjective:   Assessment/Plan: Active Problems:   Essential hypertension, benign   Diabetes mellitus   Encephalopathy acute   Abnormal LFTs   Altered mental status    Gram-negative rods bacteremia -Patient presented with fever, jaundice and right upper quadrant pain. -This is likely secondary to biliary process, acute cholangitis versus acute cholecystitis. -Cannot rule out gram-negative bacteremia secondary to UTI, await urine culture. -Blood cultures grew gram-negative rods, patient is on Zosyn. -HIDA scan pending.  Acute cholangitis versus acute cholecystitis -Patient has fever, transaminitis, mild jaundice and RUQ pain, per ultrasound no CBD stones. -Patient is on Zosyn. HIDA scan pending -Abdominal ultrasound showed gallbladder wall thickening with gallstones, but no CBD stones.  ? UTI -Patient has concentrated urine with positive nitrates and leukocyte esterase but NO pus cells. -Patient already on Zosyn, urine culture is pending.  Transaminitis -Ultrasound is consistent with hepatosteatosis. -This could be secondary to the infectious process going on the biliary tree.  Acute encephalitis -Secondary to acute illness, gram-negative bacteremia.  Code Status: Full Code Family Communication: Plan discussed with the patient. Disposition Plan: Remains inpatient   Consultants:  [place]ke  Procedures:  None  Antibiotics:  Zosyn   Objective: Filed Vitals:   07/27/14 0622  BP: 101/58  Pulse: 68  Temp: 99.5 F (37.5 C)  Resp: 18    Intake/Output Summary (Last 24 hours) at 07/27/14 1232 Last data filed at 07/27/14 0007  Gross per 24 hour  Intake      0 ml  Output    300 ml  Net   -300 ml   Filed Weights   07/26/14 1903 07/26/14 2327  Weight: 61.236 kg (135 lb) 62.2 kg (137 lb 2 oz)    Exam: General: Alert and awake,  oriented x3, not in any acute distress. HEENT: anicteric sclera, pupils reactive to light and accommodation, EOMI CVS: S1-S2 clear, no murmur rubs or gallops Chest: clear to auscultation bilaterally, no wheezing, rales or rhonchi Abdomen: soft nontender, nondistended, normal bowel sounds, no organomegaly Extremities: no cyanosis, clubbing or edema noted bilaterally Neuro: Cranial nerves II-XII intact, no focal neurological deficits  Data Reviewed: Basic Metabolic Panel:  Recent Labs Lab 07/26/14 1929 07/27/14 0516  NA 139 141  K 3.6* 3.4*  CL 101 105  CO2 21 23  GLUCOSE 169* 113*  BUN 19 16  CREATININE 1.30 1.18  CALCIUM 9.1 8.9   Liver Function Tests:  Recent Labs Lab 07/26/14 2130 07/27/14 0516  AST 180* 114*  ALT 170* 131*  ALKPHOS 132* 132*  BILITOT 2.1* 2.1*  PROT 7.2 6.8  ALBUMIN 3.5 3.3*    Recent Labs Lab 07/26/14 2130  LIPASE 9*   No results found for this basename: AMMONIA,  in the last 168 hours CBC:  Recent Labs Lab 07/26/14 1929 07/27/14 0516  WBC 11.8* 10.9*  NEUTROABS 10.3*  --   HGB 14.3 14.0  HCT 39.5 39.4  MCV 92.5 93.1  PLT 162 159   Cardiac Enzymes: No results found for this basename: CKTOTAL, CKMB, CKMBINDEX, TROPONINI,  in the last 168 hours BNP (last 3 results)  Recent Labs  04/15/14 1145  PROBNP 183.0   CBG:  Recent Labs Lab 07/27/14 0726 07/27/14 1123  GLUCAP 118* 122*    Micro Recent Results (from the past 240 hour(s))  CULTURE, BLOOD (ROUTINE X 2)     Status: None   Collection Time  07/26/14  8:11 PM      Result Value Ref Range Status   Specimen Description BLOOD LEFT FOREARM   Final   Special Requests BOTTLES DRAWN AEROBIC AND ANAEROBIC [facility] EACH   Final   Culture     Final   Value: GRAM NEGATIVE RODS     Gram Stain Report Called to,Read Back By and Verified With: [place]. AT 1110 ON 07/27/2014 BY BAUGHAM,M.     Performed at [facility]   Report Status PENDING   Incomplete  CULTURE, BLOOD  (ROUTINE X 2)     Status: None   Collection Time    07/26/14  8:11 PM      Result Value Ref Range Status   Specimen Description BLOOD LEFT ANTECUBITAL   Final   Special Requests     Final   Value: BOTTLES DRAWN AEROBIC AND ANAEROBIC AEB=6CC ANA=4CC   Culture     Final   Value: GRAM NEGATIVE RODS     Gram Stain Report Called to,Read Back By and Verified With: DICKERSON,M. AT 1110 ON 07/27/2014 BY BAUGHAM,M.     Performed at [facility]   Report Status PENDING   Incomplete     Studies: US Abdomen Complete  07/27/2014   CLINICAL DATA:  Abnormal liver function tests.  EXAM: ULTRASOUND ABDOMEN COMPLETE  COMPARISON:  08/07/2013 abdominal ultrasound.  FINDINGS: Gallbladder:  Nonspecific gallbladder wall thickening is present. Small non shadowing stones are present, with the largest measuring 5 mm. The gallbladder wall measures up to 7 mm. No pericholecystic fluid. No sonographic Murphy sign.  Common bile duct:  Diameter: 9 mm.  No common duct stone.  Liver:  Hepatic cysts are present in the RIGHT and LEFT lobes. RIGHT hepatic lobe cyst measures 27 mm x 26 mm x 31 mm. LEFT hepatic lobe cyst measures 11 mm x 5.4 mm by 8 mm. Increased hepatic echotexture compared to the adjacent RIGHT kidney, most compatible with hepatosteatosis.  IVC:  No abnormality visualized.  Pancreas:  Visualized portion unremarkable.  Spleen:  4.3 cm.  Right Kidney:  Length: 10 cm. Simple upper pole renal cyst measures 34 mm x 30 mm x 30 mm. No hydronephrosis. Mildly hypoechoic subtle cystic lesion is present in the RIGHT inferior renal pole measuring 12 mm. Probable calculus in the inferior pole measuring 6 mm.  Left Kidney:  Length: 14.7 cm.  8 cm interpolar renal cyst.  Abdominal aorta:  Atherosclerosis.  Other findings:  None.  IMPRESSION: 1. New gallbladder wall thickening with gallstones. Imaging findings are compatible with cholecystitis however the absence of a sonographic Percell Miller sign is unusual in acute cholecystitis  but these findings can be seen in chronic cholecystitis. HIDA scan may be useful in further assessment. 2. Echogenic liver consistent with hepatosteatosis. No intrahepatic biliary ductal dilation. No common duct stone. 3. Hepatic and renal cysts. RIGHT inferior pole hypoechoic rounded lesion measuring 12 mm probably represents a hemorrhagic or proteinaceous cyst. Follow-up ultrasound in 6 months recommended to assess for stability.   Electronically Signed   By: Dereck Ligas M.D.   On: 07/27/2014 10:23   Dg Chest Portable 1 View  07/26/2014   CLINICAL DATA:  Altered mental status, fever, history of prostate cancer  EXAM: PORTABLE CHEST - 1 VIEW  COMPARISON:  04/15/2014  FINDINGS: Low lung volumes with mild bibasilar opacities, likely atelectasis. No focal consolidation. No pleural effusion or pneumothorax.  The heart is top-normal in size.  Right shoulder arthroplasty.  IMPRESSION: Low lung  volumes with mild bibasilar opacities, likely atelectasis.  No evidence of acute cardiopulmonary disease.   Electronically Signed   By: Julian Hy M.D.   On: 07/26/2014 19:55    Scheduled Meds: . amLODipine  10 mg Oral Daily  . heparin  5,000 Units Subcutaneous 3 times per day  . insulin aspart  0-15 Units Subcutaneous TID WC  . insulin aspart  0-5 Units Subcutaneous QHS  . linagliptin  5 mg Oral Daily  . lisinopril  10 mg Oral BID  . pantoprazole  40 mg Oral Daily  . piperacillin-tazobactam (ZOSYN)  IV  3.375 g Intravenous Q8H  . simvastatin  20 mg Oral QHS   Continuous Infusions: . sodium chloride 75 mL/hr at 07/27/14 0147       Time spent: 35 minutes    [facility]  [facility] Pager [phone] If 7PM-7AM, please contact night-coverage at [email or web address], password [facility] 07/27/2014, 12:32 PM  LOS: 1 day

## 2014-07-28 LAB — CBC
HEMATOCRIT: 38.9 % — AB (ref 39.0–52.0)
HEMOGLOBIN: 14.3 g/dL (ref 13.0–17.0)
MCH: 33.6 pg (ref 26.0–34.0)
MCHC: 36.8 g/dL — ABNORMAL HIGH (ref 30.0–36.0)
MCV: 91.5 fL (ref 78.0–100.0)
Platelets: 132 10*3/uL — ABNORMAL LOW (ref 150–400)
RBC: 4.25 MIL/uL (ref 4.22–5.81)
RDW: 13.6 % (ref 11.5–15.5)
WBC: 8.1 10*3/uL (ref 4.0–10.5)

## 2014-07-28 LAB — COMPREHENSIVE METABOLIC PANEL
ALBUMIN: 3.1 g/dL — AB (ref 3.5–5.2)
ALK PHOS: 104 U/L (ref 39–117)
ALT: 91 U/L — ABNORMAL HIGH (ref 0–53)
AST: 64 U/L — AB (ref 0–37)
Anion gap: 18 — ABNORMAL HIGH (ref 5–15)
BILIRUBIN TOTAL: 2 mg/dL — AB (ref 0.3–1.2)
BUN: 19 mg/dL (ref 6–23)
CO2: 18 mEq/L — ABNORMAL LOW (ref 19–32)
CREATININE: 1.38 mg/dL — AB (ref 0.50–1.35)
Calcium: 8.7 mg/dL (ref 8.4–10.5)
Chloride: 103 mEq/L (ref 96–112)
GFR calc Af Amer: 52 mL/min — ABNORMAL LOW (ref >90)
GFR calc non Af Amer: 45 mL/min — ABNORMAL LOW (ref >90)
Glucose, Bld: 150 mg/dL — ABNORMAL HIGH (ref 70–99)
POTASSIUM: 3.6 meq/L — AB (ref 3.7–5.3)
Sodium: 139 mEq/L (ref 137–147)
TOTAL PROTEIN: 6.9 g/dL (ref 6.0–8.3)

## 2014-07-28 LAB — URINE CULTURE
Colony Count: NO GROWTH
Culture: NO GROWTH

## 2014-07-28 LAB — GLUCOSE, CAPILLARY
GLUCOSE-CAPILLARY: 111 mg/dL — AB (ref 70–99)
GLUCOSE-CAPILLARY: 95 mg/dL (ref 70–99)
Glucose-Capillary: 153 mg/dL — ABNORMAL HIGH (ref 70–99)
Glucose-Capillary: 104 mg/dL — ABNORMAL HIGH (ref 70–99)
Glucose-Capillary: 101 mg/dL — ABNORMAL HIGH (ref 70–99)

## 2014-07-28 LAB — LACTIC ACID, PLASMA: Lactic Acid, Venous: 1.1 mmol/L (ref 0.5–2.2)

## 2014-07-28 MED ORDER — ACETAMINOPHEN 650 MG RE SUPP
650.0000 mg | Freq: Four times a day (QID) | RECTAL | Status: DC | PRN
  Administered 2014-07-28: 650 mg via RECTAL
  Filled 2014-07-28: qty 1

## 2014-07-28 MED ORDER — PANTOPRAZOLE SODIUM 40 MG PO TBEC
40.0000 mg | DELAYED_RELEASE_TABLET | Freq: Every day | ORAL | Status: DC
  Administered 2014-07-28 – 2014-07-29 (×2): 40 mg via ORAL
  Filled 2014-07-28 (×2): qty 1

## 2014-07-28 MED ORDER — LORAZEPAM 2 MG/ML IJ SOLN: 0.5000 mg | Freq: Every evening | INTRAMUSCULAR | Status: DC | PRN

## 2014-07-28 MED ORDER — POTASSIUM CHLORIDE 10 MEQ/100ML IV SOLN
10.0000 meq | INTRAVENOUS | Status: AC
  Administered 2014-07-28 (×3): 10 meq via INTRAVENOUS
  Filled 2014-07-28: qty 100

## 2014-07-28 MED ORDER — SODIUM CHLORIDE 0.9 % IV BOLUS (SEPSIS)
250.0000 mL | Freq: Once | INTRAVENOUS | Status: AC
  Administered 2014-07-28: 250 mL via INTRAVENOUS

## 2014-07-28 MED ORDER — LORAZEPAM 2 MG/ML IJ SOLN
1.0000 mg | Freq: Once | INTRAMUSCULAR | Status: AC
  Administered 2014-07-28: 1 mg via INTRAVENOUS
  Filled 2014-07-28: qty 1

## 2014-07-28 NOTE — Progress Notes (Signed)
Pt has spiked a fever of 102.8 axillary. BP 173/84, HR 132, O2sat 98% on room air. Accu check 111. Pt will not open mouth to take po medications and is shaking. Family at bedside. MD called. Order for rectal tylenol, repeat blood cultures and ativan ordered per Dr. Shanon Brow. Will continue to monitor.

## 2014-07-28 NOTE — Progress Notes (Signed)
Pt's temp is 102.4 after Tylenol 650 mg suppository. Family at bedside concerned. MD is called again. Dr.David states that we have to give pt 48 to 72 hours before family should be concerned. She states that pt is on the right course of action with blood cultures, tylenol and antibiotics. If pt is able to swallow, she states that Ibuprofen can be ordered in addition to tylenol. Will continue to monitor.

## 2014-07-28 NOTE — Clinical Social Work Psychosocial (Signed)
Clinical Social Work Department BRIEF PSYCHOSOCIAL ASSESSMENT 07/28/2014  Patient:  Kenneth Potter, Kenneth Potter     Account Number:  [id number]     Admit date:  07/26/2014  Clinical Social Worker:  Wyatt Haste  Date/Time:  07/28/2014 11:18 AM  Referred by:  CSW  Date Referred:  07/28/2014 Referred for  SNF Placement   Other Referral:   Interview type:  Family Other interview type:   daughter- [name]    PSYCHOSOCIAL DATA Living Status:  FAMILY Admitted from facility:   Level of care:   Primary support name:  Mary Primary support relationship to patient:  CHILD, ADULT Degree of support available:   supportive    CURRENT CONCERNS Current Concerns  Post-Acute Placement   Other Concerns:    SOCIAL WORK ASSESSMENT / PLAN CSW met with one of pt's daughters at bedside, but she reports that her sister, [name] is Economist. Per [name], pt has one daughter who stays with him at night and another daughter that is there with him during the day. He was brought to ED due to vomiting, lethargy, and confusion.  At baseline, [name] reports pt is fairly independent. He requires some assist with bathing. He generally ambulates without assistive device. She indicates pt does very well at home. He receives Meals on Wheels daily. PT evaluated pt and recommendation is for SNF. [name] was very against this idea immediately. She states she feels that this is all due to infection and after it is treated, pt will return to baseline. She is open to home health PT. CSW requested that [facility] let us know as soon as possible if they change their mind regarding disposition. SNF list left in room. CM notified.   Assessment/plan status:  Referral to [employer] Other assessment/ plan:   Information/referral to community resources:   SNF list  CM for home health    PATIENT'S/FAMILY'S RESPONSE TO PLAN OF CARE: Pt unable to discuss plan of care at this time. He is oriented x4 at baseline per family, but is currently only  oriented to self per chart. Family does not wish to pursue placement as they have family with him around the clock already. CSW will sign off, but can be reconsulted if needed.       Benay Pike, [place]

## 2014-07-28 NOTE — Progress Notes (Signed)
Family has refused surgical consultation. They will talk to the hospitalist for further evaluation and treatment.

## 2014-07-28 NOTE — Evaluation (Signed)
Physical Therapy Evaluation Patient Details Name: Kenneth Potter MRN: [id number] DOB: February 13, 1926 Today's Date: 07/28/2014   History of Present Illness  This is an 80 year old man who gives a 24-hour history of nausea and vomiting associated with altered mental status. In the emergency room he was found to have a fever. The patient himself cannot give a clear history because of his altered mental state. He apparently also has a degree of cognitive impairment, possibly dementia. He also apparently has had a history of weight loss. Evaluation in the emergency room shows an abnormal urinalysis consistent with possible UTI but also abnormal liver enzymes with hyperbilirubinemia. He is now being made for further investigation and management.  Clinical Impression  Pt is an 80 year old male who presents to physical therapy with dx of acute encephalopathy.  Patient lethargic during evaluation and history received from daughter who was present throughout evaluation.  Pt received ativan at 3 am, per family report, causing his lethargy.  At home, pt was (I) with all bed mobility skills, transfers, and ambulatory skills.  During evaluation, pt required mod/max assist to complete bed mobility skills.  When at EOB, pt was more alert and required close SBA to min assist to maintain sitting at EOB for 3 minutes.  Unable to assess transfers and ambulatory skills at this time secondary to lethargy; will assess as able.  Recommend continued PT while in the hospital to complete assessment of functional mobility skills, with transition to SNF at discharge.  Educated family of continued need for PT assessment of functional mobility skills to determine if D/C recommendation still appropriate when pt is less lethargic and more engaged in functional mobility skills.  No DME recommendations at this time.      Follow Up Recommendations SNF    Equipment Recommendations  None recommended by PT       Precautions / Restrictions  Precautions Precautions: Fall Restrictions Weight Bearing Restrictions: No      Mobility  Bed Mobility Overal bed mobility: Needs Assistance Bed Mobility: Supine to Sit;Sit to Supine     Supine to sit: Max assist (Max assist for trunk and LE) Sit to supine: Mod assist;Min assist (Min assist for trunk, mod assist for LE)   General bed mobility comments: Pt able to maintain sitting at EOB with close SBA to min assist.  Pt was more awake when at EOB and able to maintain balance with occassional min assist for 2 minutes.    Transfers                 General transfer comment: Not attempted due to lethargy.   Ambulation/Gait             General Gait Details: Not attempted due to lethargy.       Balance Overall balance assessment: Needs assistance Sitting-balance support: Feet supported;Bilateral upper extremity supported Sitting balance-Leahy Scale: Poor Sitting balance - Comments: Occassional min assist to maintain while at EOB due to lethargy.                                       Pertinent Vitals/Pain PAINAD 0    Home Living Family/patient expects to be discharged to:: Skilled nursing facility Living Arrangements: Children (Lives with daughter)               Additional Comments: Pt lives with his daughter in a single story home with a  couple steps with (B) handrails to enter.  Family is very supportive and available 24/7 to assist as needed.  DME: stad walker, shower chair, tub shower    Prior Function Level of Independence: Needs assistance   Gait / Transfers Assistance Needed: Pt was (I) with bed mobility skills, transfers, and ambulation skills  ADL's / Homemaking Assistance Needed: Pt required assist from family for bathing, dressing, and cooking           Extremity/Trunk Assessment               Lower Extremity Assessment: Generalized weakness         Communication   Communication: No difficulties  Cognition  Arousal/Alertness: Lethargic;Suspect due to medications (Per family, pt recieved ativan at 3 am) Behavior During Therapy: WFL for tasks assessed/performed Overall Cognitive Status: Difficult to assess (Difficulty to assess due to lethargy)                               Assessment/Plan    PT Assessment Patient needs continued PT services  PT Diagnosis Difficulty walking;Generalized weakness   PT Problem List Decreased strength;Decreased activity tolerance;Decreased balance;Decreased mobility  PT Treatment Interventions Balance training;Functional mobility training;Therapeutic activities;Therapeutic exercise;Patient/family education;Gait training;Neuromuscular re-education   PT Goals (Current goals can be found in the Care Plan section) Acute Rehab PT Goals Patient Stated Goal: none stated    Frequency Min 3X/week    End of Session   Activity Tolerance: Patient limited by lethargy Patient left: in bed;with call bell/phone within reach;with bed alarm set;with family/visitor present           Time: 8588-5027 PT Time Calculation (min): 22 min   Charges:   PT Evaluation $Initial PT Evaluation Tier I: 1 Procedure      Kenneth Potter 07/28/2014, 10:30 AM

## 2014-07-28 NOTE — Progress Notes (Addendum)
REVIEWED. PT ADMITTED WITH E COLI BACTEREMIA AND U/S/HIDA SCAN THAT SUGGEST CHOLECYSTITIS. DISCUSSED WITH DAUGHTER PT HAS 2 OPTIONS FOR MANAGEMENT: 1/ CHOLECYSTOSTOMY TUBE(CCT) OR 2. SURGERY.  FAMILY WILL ARRANGE  FOR POA/ADDITIONAL SIBLINGS TO BE PRESENT FOR FAMILY DISCUSSION TODAY 430 PM OR 0830 TOMORROW.    1630: Spoke with family. Answered questions. Explained pt should have CCT OR SURGERY. FAMILY WILL LET ME KNOW THEIR DECISION BY 0830 JUL 30.

## 2014-07-28 NOTE — Care Management Note (Unsigned)
    Page 1 of 1   07/28/2014     5:16:10 PM CARE MANAGEMENT NOTE 07/28/2014  Patient:  Kenneth Potter, Kenneth Potter   Account Number:  [id number]  Date Initiated:  07/27/2014  Documentation initiated by:  Jolene Provost  Subjective/Objective Assessment:   Pt admitted with AMS. Pt lives with his daughter who assists with ADL's. Pt has shower chair and walker for PRN use.     Action/Plan:   PT consult obtained. Will continue to follow for discharge planning needs.   Anticipated DC Date:  07/30/2014   Anticipated DC Plan:  [place] referral  Clinical Social Worker      DC Planning Services  CM consult      Choice offered to / List presented to:             Status of service:  In process, will continue to follow Medicare Important Message given?  YES (If response is "NO", the following Medicare IM given date fields will be blank) Date Medicare IM given:  07/28/2014 Medicare IM given by:  Vladimir Creeks Date Additional Medicare IM given:   Additional Medicare IM given by:    Discharge Disposition:    Per UR Regulation:  Reviewed for med. necessity/level of care/duration of stay  If discussed at Long Length of Stay Meetings, dates discussed:    Comments:  07/28/14 [place] RN/CM    Daughter spoke with CSW today and she and family adamantly refused SNF. They will take the pt home. Spoke with daughter at bedside and she states they have never needed help such as [place] before but may need help now as the pt is much more dependent, and unless he improves greatly, will need more care. Explained that CM will be glad to set up [facility] if needed, and she thinks they may need a hosp bed. They are having a meeting with MD later this PM and will be able to make more decisions then 07/27/2014 [place], RN, MSN, [facility]

## 2014-07-28 NOTE — Progress Notes (Signed)
[facility] PROGRESS NOTE  Kenneth Potter BTD:[id number] DOB: 09-02-1926 DOA: 07/26/2014  PCP: [name], MD  Brief HPI: 80yo male presented with nausea/vomiting and altered mental status. He was noted to have a fever in the ED. Possible UTI versus cholangitis due to abnormal LFT.  Past medical history:  Past Medical History  Diagnosis Date  . Essential hypertension, benign   . Mixed hyperlipidemia   . Near syncope   . Chest pain     a. Felt noncardiac 06/2012 - ruled out for MI, cardiac cath with nonobstructive CAD.  [name] CAD (coronary artery disease)     Nonobstructive by cath 06/2012  . Bradycardia   . Diabetes mellitus without complication   . Cancer     Prostate cancer    Consultants: GI, General Surgery  Procedures: None  Antibiotics: Zosyn 7/27-->  Subjective: Patient sedated after receiving ativan earlier. Arousable. Daughter at bedside. Unable to get much information from him.  Objective: Vital Signs  Filed Vitals:   07/28/14 0141 07/28/14 0324 07/28/14 0708 07/28/14 1330  BP: 173/84  120/63 126/53  Pulse: 132  90 62  Temp: 102.8 F (39.3 C) 102.4 F (39.1 C) 99.7 F (37.6 C) 97.7 F (36.5 C)  TempSrc: Axillary Axillary Axillary Axillary  Resp:   20 18  Height:      Weight:   61.1 kg (134 lb 11.2 oz)   SpO2:   95% 97%   No intake or output data in the 24 hours ending 07/28/14 1332 Filed Weights   07/26/14 1903 07/26/14 2327 07/28/14 0708  Weight: 61.236 kg (135 lb) 62.2 kg (137 lb 2 oz) 61.1 kg (134 lb 11.2 oz)    General appearance: somnolent. In no distress Resp: clear to auscultation bilaterally Cardio: regular rate and rhythm, S1, S2 normal, no murmur, click, rub or gallop GI: soft, non-tender; bowel sounds normal; no masses,  no organomegaly Extremities: extremities normal, atraumatic, no cyanosis or edema Pulses: 2+ and symmetric Neurologic: Somnolent but arousable. Moving all extremities.  Lab Results:  Basic Metabolic  Panel:  Recent Labs Lab 07/26/14 1929 07/27/14 0516 07/28/14 0241  NA 139 141 139  K 3.6* 3.4* 3.6*  CL 101 105 103  CO2 21 23 18*  GLUCOSE 169* 113* 150*  BUN 19 16 19   CREATININE 1.30 1.18 1.38*  CALCIUM 9.1 8.9 8.7   Liver Function Tests:  Recent Labs Lab 07/26/14 2130 07/27/14 0516 07/28/14 0241  AST 180* 114* 64*  ALT 170* 131* 91*  ALKPHOS 132* 132* 104  BILITOT 2.1* 2.1* 2.0*  PROT 7.2 6.8 6.9  ALBUMIN 3.5 3.3* 3.1*    Recent Labs Lab 07/26/14 2130  LIPASE 9*   CBC:  Recent Labs Lab 07/26/14 1929 07/27/14 0516 07/28/14 0241  WBC 11.8* 10.9* 8.1  NEUTROABS 10.3*  --   --   HGB 14.3 14.0 14.3  HCT 39.5 39.4 38.9*  MCV 92.5 93.1 91.5  PLT 162 159 132*   CBG:  Recent Labs Lab 07/27/14 1709 07/27/14 2110 07/28/14 0200 07/28/14 0747 07/28/14 1128  GLUCAP 129* 123* 111* 153* 104*    Recent Results (from the past 240 hour(s))  CULTURE, BLOOD (ROUTINE X 2)     Status: None   Collection Time    07/26/14  8:11 PM      Result Value Ref Range Status   Specimen Description BLOOD LEFT FOREARM   Final   Special Requests BOTTLES DRAWN AEROBIC AND ANAEROBIC [place]   Final  Culture  Setup Time     Final   Value: 07/27/2014 19:14     Performed at [facility]   Culture     Final   Value: ESCHERICHIA COLI     Note: Gram Stain Report Called to,Read Back By and Verified With: [id number] 07/27/14 BY Elza Rafter Performed at [facility]     Performed at [facility]   Report Status PENDING   Incomplete  CULTURE, BLOOD (ROUTINE X 2)     Status: None   Collection Time    07/26/14  8:11 PM      Result Value Ref Range Status   Specimen Description BLOOD LEFT ANTECUBITAL   Final   Special Requests     Final   Value: BOTTLES DRAWN AEROBIC AND ANAEROBIC AEB=6CC ANA=4CC   Culture  Setup Time     Final   Value: 07/27/2014 18:58     Performed at [facility]   Culture     Final   Value: ESCHERICHIA COLI     Note: Gram  Stain Report Called to,Read Back By and Verified With: Cory Roughen [id number] 07/27/14 BY Elza Rafter Performed at [facility]     Performed at [facility]   Report Status PENDING   Incomplete  CULTURE, BLOOD (ROUTINE X 2)     Status: None   Collection Time    07/28/14  2:41 AM      Result Value Ref Range Status   Specimen Description BLOOD LEFT FOREARM   Final   Special Requests BOTTLES DRAWN AEROBIC AND ANAEROBIC 6CC EACH   Final   Culture NO GROWTH <24 HRS   Final   Report Status PENDING   Incomplete  CULTURE, BLOOD (ROUTINE X 2)     Status: None   Collection Time    07/28/14  2:41 AM      Result Value Ref Range Status   Specimen Description BLOOD LEFT HAND   Final   Special Requests     Final   Value: BOTTLES DRAWN AEROBIC AND ANAEROBIC AEB 6CC ANA 8CC   Culture NO GROWTH <24 HRS   Final   Report Status PENDING   Incomplete      Studies/Results: US Abdomen Complete  07/27/2014   CLINICAL DATA:  Abnormal liver function tests.  EXAM: ULTRASOUND ABDOMEN COMPLETE  COMPARISON:  08/07/2013 abdominal ultrasound.  FINDINGS: Gallbladder:  Nonspecific gallbladder wall thickening is present. Small non shadowing stones are present, with the largest measuring 5 mm. The gallbladder wall measures up to 7 mm. No pericholecystic fluid. No sonographic Murphy sign.  Common bile duct:  Diameter: 9 mm.  No common duct stone.  Liver:  Hepatic cysts are present in the RIGHT and LEFT lobes. RIGHT hepatic lobe cyst measures 27 mm x 26 mm x 31 mm. LEFT hepatic lobe cyst measures 11 mm x 5.4 mm by 8 mm. Increased hepatic echotexture compared to the adjacent RIGHT kidney, most compatible with hepatosteatosis.  IVC:  No abnormality visualized.  Pancreas:  Visualized portion unremarkable.  Spleen:  4.3 cm.  Right Kidney:  Length: 10 cm. Simple upper pole renal cyst measures 34 mm x 30 mm x 30 mm. No hydronephrosis. Mildly hypoechoic subtle cystic lesion is present in the RIGHT inferior renal pole measuring 12  mm. Probable calculus in the inferior pole measuring 6 mm.  Left Kidney:  Length: 14.7 cm.  8 cm interpolar renal cyst.  Abdominal aorta:  Atherosclerosis.  Other  findings:  None.  IMPRESSION: 1. New gallbladder wall thickening with gallstones. Imaging findings are compatible with cholecystitis however the absence of a sonographic Percell Miller sign is unusual in acute cholecystitis but these findings can be seen in chronic cholecystitis. HIDA scan may be useful in further assessment. 2. Echogenic liver consistent with hepatosteatosis. No intrahepatic biliary ductal dilation. No common duct stone. 3. Hepatic and renal cysts. RIGHT inferior pole hypoechoic rounded lesion measuring 12 mm probably represents a hemorrhagic or proteinaceous cyst. Follow-up ultrasound in 6 months recommended to assess for stability.   Electronically Signed   By: Dereck Ligas M.D.   On: 07/27/2014 10:23   Nm Hepato W/eject Fract  07/27/2014   CLINICAL DATA:  RIGHT upper quadrant pain, history hypertension, mixed hyperlipidemia, diabetes, coronary artery disease, former smoker, prostate cancer  EXAM: NUCLEAR MEDICINE HEPATOBILIARY IMAGING WITH GALLBLADDER EF  TECHNIQUE: Sequential images of the abdomen were obtained out to 60 minutes following intravenous administration of radiopharmaceutical. After slow intravenous infusion of micrograms Cholecystokinin, gallbladder ejection fraction was determined.  RADIOPHARMACEUTICALS:  5 mCi Tc-37m Choletec IV  COMPARISON:  None  FINDINGS: Normal tracer extraction from bloodstream indicating normal hepatocellular function.  Normal excretion of tracer into biliary tree.  Gallbladder visualized by 20 min.  Small bowel visualized by 28 min.  No focal hepatic retention of tracer.  Only mild emptying of tracer from the gallbladder occured following CCK stimulation.  Calculated gallbladder ejection fraction is 27%, mildly decreased.  Patient related no symptoms following CCK administration.  IMPRESSION:  Patent biliary tree.  Mildly decreased gallbladder response to CCK stimulation with a decreased ejection fraction of 27%.   Electronically Signed   By: Lavonia Dana M.D.   On: 07/27/2014 15:55   Dg Chest Portable 1 View  07/26/2014   CLINICAL DATA:  Altered mental status, fever, history of prostate cancer  EXAM: PORTABLE CHEST - 1 VIEW  COMPARISON:  04/15/2014  FINDINGS: Low lung volumes with mild bibasilar opacities, likely atelectasis. No focal consolidation. No pleural effusion or pneumothorax.  The heart is top-normal in size.  Right shoulder arthroplasty.  IMPRESSION: Low lung volumes with mild bibasilar opacities, likely atelectasis.  No evidence of acute cardiopulmonary disease.   Electronically Signed   By: Julian Hy M.D.   On: 07/26/2014 19:55    Medications:  Scheduled: . amLODipine  10 mg Oral Daily  . heparin  5,000 Units Subcutaneous 3 times per day  . insulin aspart  0-15 Units Subcutaneous TID WC  . insulin aspart  0-5 Units Subcutaneous QHS  . pantoprazole  40 mg Oral Daily  . piperacillin-tazobactam (ZOSYN)  IV  3.375 g Intravenous Q8H  . simvastatin  20 mg Oral QHS   Continuous: . sodium chloride 75 mL/hr at 07/28/14 1316   XIP:[id number], acetaminophen, nitroGLYCERIN, ondansetron (ZOFRAN) IV, ondansetron  Assessment/Plan:  Active Problems:   Essential hypertension, benign   Diabetes mellitus   Encephalopathy acute   Abnormal LFTs   Altered mental status   Bacteremia    Gram-negative rods bacteremia  Concerning for biliary process, acute cholangitis versus acute cholecystitis. UTI is another possibility. Await final ID. Patient is on Zosyn.   Acute cholangitis versus acute cholecystitis  Patient has fever, transaminitis, mild jaundice and RUQ pain, per ultrasound no CBD stones. HIDA report as below. GI is following. General surgery consulted. Continue Zosyn.   Possible UTI  Patient has concentrated urine with positive nitrates and leukocyte esterase  but NO pus cells. Patient already on Zosyn, urine culture  is pending.   Transaminitis  As above.   Acute encephalitis  Secondary to acute illness, gram-negative bacteremia.   History of HTN Stable. Continue to monitor  History of DM2 On SSI. Monitor CBG. Hold oral agents for now as oral intake is poor.  Code Status: Full Code  DVT Prophylaxis: Heparin    Family Communication: Discussed with daughter  Disposition Plan: Not ready for discharge. Lives at home with family. Family refused SNF.    LOS: 2 days   [facility] Pager [phone] 07/28/2014, 1:32 PM  If 8PM-8AM, please contact night-coverage at [email or web address], password [email or web address]   Disclaimer: This note was dictated with voice recognition software. Similar sounding words can inadvertently be transcribed and may not be corrected upon review.

## 2014-07-28 NOTE — Progress Notes (Signed)
Subjective: Opens eyes briefly then falls asleep. Received Ativan this morning after febrile episode with tremors. Daughters at bedside with multiple questions.   Objective: Vital signs in last 24 hours: Temp:  [98.8 F (37.1 C)-102.8 F (39.3 C)] 99.7 F (37.6 C) (07/29 0708) Pulse Rate:  [58-132] 90 (07/29 0708) Resp:  [18-20] 20 (07/29 0708) BP: (120-173)/(59-84) 120/63 mmHg (07/29 0708) SpO2:  [95 %-98 %] 95 % (07/29 0708) Weight:  [134 lb 11.2 oz (61.1 kg)] 134 lb 11.2 oz (61.1 kg) (07/29 0708)   General:   Sleeping with eyes closed, only opens eyes to verbal stimuli.  Head:  Normocephalic and atraumatic. Eyes:  No icterus, sclera clear. Conjuctiva pink.  Abdomen:  Bowel sounds present, soft, non-tender, non-distended. No HSM or hernias noted. No rebound or guarding. No masses appreciated  Msk:  Symmetrical without gross deformities. Normal posture. Extremities:  Without edema. Neurologic:  Unable to assess   Intake/Output from previous day:   Intake/Output this shift:    Lab Results:  Recent Labs  07/26/14 1929 07/27/14 0516 07/28/14 0241  WBC 11.8* 10.9* 8.1  HGB 14.3 14.0 14.3  HCT 39.5 39.4 38.9*  PLT 162 159 132*   BMET  Recent Labs  07/26/14 1929 07/27/14 0516 07/28/14 0241  NA 139 141 139  K 3.6* 3.4* 3.6*  CL 101 105 103  CO2 21 23 18*  GLUCOSE 169* 113* 150*  BUN 19 16 19   CREATININE 1.30 1.18 1.38*  CALCIUM 9.1 8.9 8.7   LFT  Recent Labs  07/26/14 2130 07/27/14 0516 07/28/14 0241  PROT 7.2 6.8 6.9  ALBUMIN 3.5 3.3* 3.1*  AST 180* 114* 64*  ALT 170* 131* 91*  ALKPHOS 132* 132* 104  BILITOT 2.1* 2.1* 2.0*  BILIDIR 0.9*  --   --   IBILI 1.2*  --   --     Studies/Results: US Abdomen Complete  07/27/2014   CLINICAL DATA:  Abnormal liver function tests.  EXAM: ULTRASOUND ABDOMEN COMPLETE  COMPARISON:  08/07/2013 abdominal ultrasound.  FINDINGS: Gallbladder:  Nonspecific gallbladder wall thickening is present. Small non  shadowing stones are present, with the largest measuring 5 mm. The gallbladder wall measures up to 7 mm. No pericholecystic fluid. No sonographic Murphy sign.  Common bile duct:  Diameter: 9 mm.  No common duct stone.  Liver:  Hepatic cysts are present in the RIGHT and LEFT lobes. RIGHT hepatic lobe cyst measures 27 mm x 26 mm x 31 mm. LEFT hepatic lobe cyst measures 11 mm x 5.4 mm by 8 mm. Increased hepatic echotexture compared to the adjacent RIGHT kidney, most compatible with hepatosteatosis.  IVC:  No abnormality visualized.  Pancreas:  Visualized portion unremarkable.  Spleen:  4.3 cm.  Right Kidney:  Length: 10 cm. Simple upper pole renal cyst measures 34 mm x 30 mm x 30 mm. No hydronephrosis. Mildly hypoechoic subtle cystic lesion is present in the RIGHT inferior renal pole measuring 12 mm. Probable calculus in the inferior pole measuring 6 mm.  Left Kidney:  Length: 14.7 cm.  8 cm interpolar renal cyst.  Abdominal aorta:  Atherosclerosis.  Other findings:  None.  IMPRESSION: 1. New gallbladder wall thickening with gallstones. Imaging findings are compatible with cholecystitis however the absence of a sonographic Percell Miller sign is unusual in acute cholecystitis but these findings can be seen in chronic cholecystitis. HIDA scan may be useful in further assessment. 2. Echogenic liver consistent with hepatosteatosis. No intrahepatic biliary ductal dilation. No common duct stone.  3. Hepatic and renal cysts. RIGHT inferior pole hypoechoic rounded lesion measuring 12 mm probably represents a hemorrhagic or proteinaceous cyst. Follow-up ultrasound in 6 months recommended to assess for stability.   Electronically Signed   By: Dereck Ligas M.D.   On: 07/27/2014 10:23   Nm Hepato W/eject Fract  07/27/2014   CLINICAL DATA:  RIGHT upper quadrant pain, history hypertension, mixed hyperlipidemia, diabetes, coronary artery disease, former smoker, prostate cancer  EXAM: NUCLEAR MEDICINE HEPATOBILIARY IMAGING WITH  GALLBLADDER EF  TECHNIQUE: Sequential images of the abdomen were obtained out to 60 minutes following intravenous administration of radiopharmaceutical. After slow intravenous infusion of micrograms Cholecystokinin, gallbladder ejection fraction was determined.  RADIOPHARMACEUTICALS:  5 mCi Tc-55m Choletec IV  COMPARISON:  None  FINDINGS: Normal tracer extraction from bloodstream indicating normal hepatocellular function.  Normal excretion of tracer into biliary tree.  Gallbladder visualized by 20 min.  Small bowel visualized by 28 min.  No focal hepatic retention of tracer.  Only mild emptying of tracer from the gallbladder occured following CCK stimulation.  Calculated gallbladder ejection fraction is 27%, mildly decreased.  Patient related no symptoms following CCK administration.  IMPRESSION: Patent biliary tree.  Mildly decreased gallbladder response to CCK stimulation with a decreased ejection fraction of 27%.   Electronically Signed   By: Lavonia Dana M.D.   On: 07/27/2014 15:55   Dg Chest Portable 1 View  07/26/2014   CLINICAL DATA:  Altered mental status, fever, history of prostate cancer  EXAM: PORTABLE CHEST - 1 VIEW  COMPARISON:  04/15/2014  FINDINGS: Low lung volumes with mild bibasilar opacities, likely atelectasis. No focal consolidation. No pleural effusion or pneumothorax.  The heart is top-normal in size.  Right shoulder arthroplasty.  IMPRESSION: Low lung volumes with mild bibasilar opacities, likely atelectasis.  No evidence of acute cardiopulmonary disease.   Electronically Signed   By: Julian Hy M.D.   On: 07/26/2014 19:55    Assessment: 80 year old male admitted with N/V, altered mental status, likely UTI, elevated LFTs with [place] noting possible cholecystitis. HIDA revealed patent biliary tree, mildly decreased gallbladder EF of 27%. LFTs continuing to improve; patient sedated after Ativan but physical exam benign. Will request general surgery consult for further evaluation.  Remains on empiric antibiotics.   Plan: PPI daily General Surgery Consult Continue Zosyn Continue to follow LFTs   Orvil Feil, ANP-BC [facility]    LOS: 2 days    07/28/2014, 8:03 AM

## 2014-07-29 ENCOUNTER — Encounter (HOSPITAL_COMMUNITY): Payer: Self-pay | Admitting: Cardiology

## 2014-07-29 DIAGNOSIS — E782 Mixed hyperlipidemia: Secondary | ICD-10-CM

## 2014-07-29 DIAGNOSIS — I379 Nonrheumatic pulmonary valve disorder, unspecified: Secondary | ICD-10-CM

## 2014-07-29 DIAGNOSIS — I2510 Atherosclerotic heart disease of native coronary artery without angina pectoris: Secondary | ICD-10-CM

## 2014-07-29 DIAGNOSIS — K810 Acute cholecystitis: Secondary | ICD-10-CM | POA: Diagnosis present

## 2014-07-29 DIAGNOSIS — R7881 Bacteremia: Secondary | ICD-10-CM

## 2014-07-29 DIAGNOSIS — Z01810 Encounter for preprocedural cardiovascular examination: Secondary | ICD-10-CM

## 2014-07-29 LAB — GLUCOSE, CAPILLARY
GLUCOSE-CAPILLARY: 84 mg/dL (ref 70–99)
GLUCOSE-CAPILLARY: 87 mg/dL (ref 70–99)
Glucose-Capillary: 80 mg/dL (ref 70–99)
Glucose-Capillary: 124 mg/dL — ABNORMAL HIGH (ref 70–99)

## 2014-07-29 LAB — COMPREHENSIVE METABOLIC PANEL
ALT: 56 U/L — ABNORMAL HIGH (ref 0–53)
ANION GAP: 10 (ref 5–15)
AST: 45 U/L — ABNORMAL HIGH (ref 0–37)
Albumin: 2.6 g/dL — ABNORMAL LOW (ref 3.5–5.2)
Alkaline Phosphatase: 77 U/L (ref 39–117)
BILIRUBIN TOTAL: 1 mg/dL (ref 0.3–1.2)
BUN: 14 mg/dL (ref 6–23)
CO2: 23 meq/L (ref 19–32)
CREATININE: 1.14 mg/dL (ref 0.50–1.35)
Calcium: 8.5 mg/dL (ref 8.4–10.5)
Chloride: 106 mEq/L (ref 96–112)
GFR calc Af Amer: 65 mL/min — ABNORMAL LOW (ref >90)
GFR calc non Af Amer: 56 mL/min — ABNORMAL LOW (ref >90)
Glucose, Bld: 104 mg/dL — ABNORMAL HIGH (ref 70–99)
POTASSIUM: 3.3 meq/L — AB (ref 3.7–5.3)
Sodium: 139 mEq/L (ref 137–147)
Total Protein: 6.3 g/dL (ref 6.0–8.3)

## 2014-07-29 LAB — CULTURE, BLOOD (ROUTINE X 2)

## 2014-07-29 LAB — CBC
HCT: 35.5 % — ABNORMAL LOW (ref 39.0–52.0)
HEMOGLOBIN: 12.9 g/dL — AB (ref 13.0–17.0)
MCH: 33.2 pg (ref 26.0–34.0)
MCHC: 36.3 g/dL — ABNORMAL HIGH (ref 30.0–36.0)
MCV: 91.5 fL (ref 78.0–100.0)
Platelets: 131 10*3/uL — ABNORMAL LOW (ref 150–400)
RBC: 3.88 MIL/uL — AB (ref 4.22–5.81)
RDW: 13.6 % (ref 11.5–15.5)
WBC: 8.9 10*3/uL (ref 4.0–10.5)

## 2014-07-29 MED ORDER — POTASSIUM CHLORIDE CRYS ER 20 MEQ PO TBCR
40.0000 meq | EXTENDED_RELEASE_TABLET | Freq: Once | ORAL | Status: AC
  Administered 2014-07-29: 40 meq via ORAL
  Filled 2014-07-29: qty 2
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
## 2014-07-29 NOTE — Progress Notes (Signed)
Patient with orders to be transferred to [facility] hospital. Report called to Mickel Baas, [place]. Patient stable. Patient transferred via [place].

## 2014-07-29 NOTE — Progress Notes (Signed)
  Echocardiogram 2D Echocardiogram has been performed.  [place], [place] 07/29/2014, 1:47 PM

## 2014-07-29 NOTE — Progress Notes (Signed)
Subjective:  Patient alert. Daughter/son at bedside. Patient denies abdominal pain.  Objective: Vital signs in last 24 hours: Temp:  [97.7 F (36.5 C)-99 F (37.2 C)] 97.9 F (36.6 C) (07/30 0640) Pulse Rate:  [62-89] 89 (07/30 0640) Resp:  [18-20] 20 (07/30 0640) BP: (126-138)/(53-70) 138/61 mmHg (07/30 0640) SpO2:  [97 %-98 %] 98 % (07/30 0640) Last BM Date: 07/28/14 General:   Alert,  Well-developed, well-nourished, pleasant and cooperative in NAD Head:  Normocephalic and atraumatic. Eyes:  Sclera clear, no icterus.   Abdomen:  Soft, nontender and nondistended.   Normal bowel sounds, without guarding, and without rebound.   Extremities:  Without clubbing, deformity or edema. Neurologic:  Alert   Skin:  Intact without significant lesions or rashes. Psych:  Alert and cooperative. Normal mood and affect.  Intake/Output from previous day: 07/29 0701 - 07/30 0700 In: 2988.8 [P.O.:40; I.V.:2948.8] Out: 975 [Urine:975] Intake/Output this shift:    Lab Results: CBC  Recent Labs  07/27/14 0516 07/28/14 0241 07/29/14 0601  WBC 10.9* 8.1 8.9  HGB 14.0 14.3 12.9*  HCT 39.4 38.9* 35.5*  MCV 93.1 91.5 91.5  PLT 159 132* 131*   BMET  Recent Labs  07/27/14 0516 07/28/14 0241 07/29/14 0601  NA 141 139 139  K 3.4* 3.6* 3.3*  CL 105 103 106  CO2 23 18* 23  GLUCOSE 113* 150* 104*  BUN 16 19 14   CREATININE 1.18 1.38* 1.14  CALCIUM 8.9 8.7 8.5   LFTs  Recent Labs  07/26/14 2130 07/27/14 0516 07/28/14 0241 07/29/14 0601  BILITOT 2.1* 2.1* 2.0* 1.0  BILIDIR 0.9*  --   --   --   IBILI 1.2*  --   --   --   ALKPHOS 132* 132* 104 77  AST 180* 114* 64* 45*  ALT 170* 131* 91* 56*  PROT 7.2 6.8 6.9 6.3  ALBUMIN 3.5 3.3* 3.1* 2.6*    Recent Labs  07/26/14 2130  LIPASE 9*   PT/INR No results found for this basename: LABPROT, INR,  in the last 72 hours    Imaging Studies: US Abdomen Complete  07/27/2014   CLINICAL DATA:  Abnormal liver function tests.  EXAM:  ULTRASOUND ABDOMEN COMPLETE  COMPARISON:  08/07/2013 abdominal ultrasound.  FINDINGS: Gallbladder:  Nonspecific gallbladder wall thickening is present. Small non shadowing stones are present, with the largest measuring 5 mm. The gallbladder wall measures up to 7 mm. No pericholecystic fluid. No sonographic Murphy sign.  Common bile duct:  Diameter: 9 mm.  No common duct stone.  Liver:  Hepatic cysts are present in the RIGHT and LEFT lobes. RIGHT hepatic lobe cyst measures 27 mm x 26 mm x 31 mm. LEFT hepatic lobe cyst measures 11 mm x 5.4 mm by 8 mm. Increased hepatic echotexture compared to the adjacent RIGHT kidney, most compatible with hepatosteatosis.  IVC:  No abnormality visualized.  Pancreas:  Visualized portion unremarkable.  Spleen:  4.3 cm.  Right Kidney:  Length: 10 cm. Simple upper pole renal cyst measures 34 mm x 30 mm x 30 mm. No hydronephrosis. Mildly hypoechoic subtle cystic lesion is present in the RIGHT inferior renal pole measuring 12 mm. Probable calculus in the inferior pole measuring 6 mm.  Left Kidney:  Length: 14.7 cm.  8 cm interpolar renal cyst.  Abdominal aorta:  Atherosclerosis.  Other findings:  None.  IMPRESSION: 1. New gallbladder wall thickening with gallstones. Imaging findings are compatible with cholecystitis however the absence of a sonographic Percell Miller sign is  unusual in acute cholecystitis but these findings can be seen in chronic cholecystitis. HIDA scan may be useful in further assessment. 2. Echogenic liver consistent with hepatosteatosis. No intrahepatic biliary ductal dilation. No common duct stone. 3. Hepatic and renal cysts. RIGHT inferior pole hypoechoic rounded lesion measuring 12 mm probably represents a hemorrhagic or proteinaceous cyst. Follow-up ultrasound in 6 months recommended to assess for stability.   Electronically Signed   By: Dereck Ligas M.D.   On: 07/27/2014 10:23   Nm Hepato W/eject Fract  07/27/2014   CLINICAL DATA:  RIGHT upper quadrant pain, history  hypertension, mixed hyperlipidemia, diabetes, coronary artery disease, former smoker, prostate cancer  EXAM: NUCLEAR MEDICINE HEPATOBILIARY IMAGING WITH GALLBLADDER EF  TECHNIQUE: Sequential images of the abdomen were obtained out to 60 minutes following intravenous administration of radiopharmaceutical. After slow intravenous infusion of micrograms Cholecystokinin, gallbladder ejection fraction was determined.  RADIOPHARMACEUTICALS:  5 mCi Tc-27m Choletec IV  COMPARISON:  None  FINDINGS: Normal tracer extraction from bloodstream indicating normal hepatocellular function.  Normal excretion of tracer into biliary tree.  Gallbladder visualized by 20 min.  Small bowel visualized by 28 min.  No focal hepatic retention of tracer.  Only mild emptying of tracer from the gallbladder occured following CCK stimulation.  Calculated gallbladder ejection fraction is 27%, mildly decreased.  Patient related no symptoms following CCK administration.  IMPRESSION: Patent biliary tree.  Mildly decreased gallbladder response to CCK stimulation with a decreased ejection fraction of 27%.   Electronically Signed   By: Lavonia Dana M.D.   On: 07/27/2014 15:55   Dg Chest Portable 1 View  07/26/2014   CLINICAL DATA:  Altered mental status, fever, history of prostate cancer  EXAM: PORTABLE CHEST - 1 VIEW  COMPARISON:  04/15/2014  FINDINGS: Low lung volumes with mild bibasilar opacities, likely atelectasis. No focal consolidation. No pleural effusion or pneumothorax.  The heart is top-normal in size.  Right shoulder arthroplasty.  IMPRESSION: Low lung volumes with mild bibasilar opacities, likely atelectasis.  No evidence of acute cardiopulmonary disease.   Electronically Signed   By: Julian Hy M.D.   On: 07/26/2014 19:55  [2 weeks]   Assessment:  80 year old male admitted with N/V, altered mental status, elevated LFTs. Noted to have E.coli bacteremia. U/S/HIDA suggest cholecystitis. Dr. Oneida Alar addressed options with family  yesterday including cholecystostomy tube or surgery. Family decided to pursue cholecystectomy but there has been discussion of needing to be done in [place] due to Dr. Arnoldo Morale leaving town shortly. Cardiology consultation requested. Decision to be made regarding surgery after cardiac evaluation. When I spoke to family, they were against transfer.  Plan: 1. Continue antibiotics. 2. Await decision regarding location of surgery.    LOS: 3 days   Neil Crouch  07/29/2014, 7:49 AM

## 2014-07-29 NOTE — Progress Notes (Signed)
REVIEWED. SPOKE WITH VICKI. FAMILY OK TO TRANSFER TO [facility] FOR CHOLECYSTECTOMY. CARDIOLOGY CONSULT COMPLETE. ECHO PENDING. DISCUSSED WITH DRs: [facility] AND JENKINS.

## 2014-07-29 NOTE — Progress Notes (Addendum)
[facility] PROGRESS NOTE  Kenneth Potter JEH:[id number] DOB: 1926/06/14 DOA: 07/26/2014  PCP: [name], MD  Brief HPI: 80yo male presented with nausea/vomiting and altered mental status. He was noted to have a fever in the ED. Possible UTI versus cholangitis due to abnormal LFT.  Past medical history:  Past Medical History  Diagnosis Date  . Essential hypertension, benign   . Mixed hyperlipidemia   . Near syncope   . Chest pain     a. Felt noncardiac 06/2012 - ruled out for MI, cardiac cath with nonobstructive CAD.  [name] CAD (coronary artery disease)     Nonobstructive by cath 06/2012  . Bradycardia   . Diabetes mellitus without complication   . Cancer     Prostate cancer    Consultants: GI, General Surgery, Cardiology  Procedures: None  Antibiotics: Zosyn 7/27-->  Subjective: Patient awake this morning. Denies any complaints. No nausea or vomiting.   Objective: Vital Signs  Filed Vitals:   07/28/14 0708 07/28/14 1330 07/28/14 2002 07/29/14 0640  BP: 120/63 126/53 136/70 138/61  Pulse: 90 62 62 89  Temp: 99.7 F (37.6 C) 97.7 F (36.5 C) 99 F (37.2 C) 97.9 F (36.6 C)  TempSrc: Axillary Axillary Oral Oral  Resp: 20 18 20 20   Height:      Weight: 61.1 kg (134 lb 11.2 oz)     SpO2: 95% 97% 98% 98%    Intake/Output Summary (Last 24 hours) at 07/29/14 0732 Last data filed at 07/29/14 0641  Gross per 24 hour  Intake 2988.75 ml  Output    975 ml  Net 2013.75 ml   Filed Weights   07/26/14 1903 07/26/14 2327 07/28/14 0708  Weight: 61.236 kg (135 lb) 62.2 kg (137 lb 2 oz) 61.1 kg (134 lb 11.2 oz)    General appearance: Awake and in no distress Resp: clear to auscultation bilaterally Cardio: regular rate and rhythm, S1, S2 normal, no murmur, click, rub or gallop GI: soft, non-tender; bowel sounds normal; no masses,  no organomegaly Extremities: No edema Pulses: 2+ and symmetric Neurologic: Awake and alert. No focal deficits.   Lab  Results:  Basic Metabolic Panel:  Recent Labs Lab 07/26/14 1929 07/27/14 0516 07/28/14 0241 07/29/14 0601  NA 139 141 139 139  K 3.6* 3.4* 3.6* 3.3*  CL 101 105 103 106  CO2 21 23 18* 23  GLUCOSE 169* 113* 150* 104*  BUN 19 16 19 14   CREATININE 1.30 1.18 1.38* 1.14  CALCIUM 9.1 8.9 8.7 8.5   Liver Function Tests:  Recent Labs Lab 07/26/14 2130 07/27/14 0516 07/28/14 0241 07/29/14 0601  AST 180* 114* 64* 45*  ALT 170* 131* 91* 56*  ALKPHOS 132* 132* 104 77  BILITOT 2.1* 2.1* 2.0* 1.0  PROT 7.2 6.8 6.9 6.3  ALBUMIN 3.5 3.3* 3.1* 2.6*    Recent Labs Lab 07/26/14 2130  LIPASE 9*   CBC:  Recent Labs Lab 07/26/14 1929 07/27/14 0516 07/28/14 0241 07/29/14 0601  WBC 11.8* 10.9* 8.1 8.9  NEUTROABS 10.3*  --   --   --   HGB 14.3 14.0 14.3 12.9*  HCT 39.5 39.4 38.9* 35.5*  MCV 92.5 93.1 91.5 91.5  PLT 162 159 132* 131*   CBG:  Recent Labs Lab 07/28/14 0747 07/28/14 1128 07/28/14 1655 07/28/14 2004 07/29/14 0717  GLUCAP 153* 104* 101* 95 80    Recent Results (from the past 240 hour(s))  URINE CULTURE     Status: None   Collection  Time    07/26/14  7:52 PM      Result Value Ref Range Status   Specimen Description URINE, CATHETERIZED   Final   Special Requests NONE   Final   Culture  Setup Time     Final   Value: 07/27/2014 14:28     Performed at [place]     Final   Value: NO GROWTH     Performed at [facility]   Culture     Final   Value: NO GROWTH     Performed at [facility]   Report Status 07/28/2014 FINAL   Final  CULTURE, BLOOD (ROUTINE X 2)     Status: None   Collection Time    07/26/14  8:11 PM      Result Value Ref Range Status   Specimen Description BLOOD LEFT FOREARM   Final   Special Requests BOTTLES DRAWN AEROBIC AND ANAEROBIC [facility] EACH   Final   Culture  Setup Time     Final   Value: 07/27/2014 19:14     Performed at [facility]   Culture     Final   Value: ESCHERICHIA  COLI     Note: Gram Stain Report Called to,Read Back By and Verified With: DICKERSONM 1110 07/27/14 BY Elza Rafter Performed at [facility]     Performed at [facility]   Report Status PENDING   Incomplete  CULTURE, BLOOD (ROUTINE X 2)     Status: None   Collection Time    07/26/14  8:11 PM      Result Value Ref Range Status   Specimen Description BLOOD LEFT ANTECUBITAL   Final   Special Requests     Final   Value: BOTTLES DRAWN AEROBIC AND ANAEROBIC AEB=6CC ANA=4CC   Culture  Setup Time     Final   Value: 07/27/2014 18:58     Performed at [facility]   Culture     Final   Value: ESCHERICHIA COLI     Note: Gram Stain Report Called to,Read Back By and Verified With: Cory Roughen 1110 07/27/14 BY Elza Rafter Performed at [facility]     Performed at [facility]   Report Status PENDING   Incomplete  CULTURE, BLOOD (ROUTINE X 2)     Status: None   Collection Time    07/28/14  2:41 AM      Result Value Ref Range Status   Specimen Description BLOOD LEFT FOREARM   Final   Special Requests BOTTLES DRAWN AEROBIC AND ANAEROBIC 6CC EACH   Final   Culture NO GROWTH <24 HRS   Final   Report Status PENDING   Incomplete  CULTURE, BLOOD (ROUTINE X 2)     Status: None   Collection Time    07/28/14  2:41 AM      Result Value Ref Range Status   Specimen Description BLOOD LEFT HAND   Final   Special Requests     Final   Value: BOTTLES DRAWN AEROBIC AND ANAEROBIC AEB 6CC ANA 8CC   Culture NO GROWTH <24 HRS   Final   Report Status PENDING   Incomplete      Studies/Results: US Abdomen Complete  07/27/2014   CLINICAL DATA:  Abnormal liver function tests.  EXAM: ULTRASOUND ABDOMEN COMPLETE  COMPARISON:  08/07/2013 abdominal ultrasound.  FINDINGS: Gallbladder:  Nonspecific gallbladder wall thickening is present. Small non shadowing stones are  present, with the largest measuring 5 mm. The gallbladder wall measures up to 7 mm. No pericholecystic fluid. No sonographic  Murphy sign.  Common bile duct:  Diameter: 9 mm.  No common duct stone.  Liver:  Hepatic cysts are present in the RIGHT and LEFT lobes. RIGHT hepatic lobe cyst measures 27 mm x 26 mm x 31 mm. LEFT hepatic lobe cyst measures 11 mm x 5.4 mm by 8 mm. Increased hepatic echotexture compared to the adjacent RIGHT kidney, most compatible with hepatosteatosis.  IVC:  No abnormality visualized.  Pancreas:  Visualized portion unremarkable.  Spleen:  4.3 cm.  Right Kidney:  Length: 10 cm. Simple upper pole renal cyst measures 34 mm x 30 mm x 30 mm. No hydronephrosis. Mildly hypoechoic subtle cystic lesion is present in the RIGHT inferior renal pole measuring 12 mm. Probable calculus in the inferior pole measuring 6 mm.  Left Kidney:  Length: 14.7 cm.  8 cm interpolar renal cyst.  Abdominal aorta:  Atherosclerosis.  Other findings:  None.  IMPRESSION: 1. New gallbladder wall thickening with gallstones. Imaging findings are compatible with cholecystitis however the absence of a sonographic Percell Miller sign is unusual in acute cholecystitis but these findings can be seen in chronic cholecystitis. HIDA scan may be useful in further assessment. 2. Echogenic liver consistent with hepatosteatosis. No intrahepatic biliary ductal dilation. No common duct stone. 3. Hepatic and renal cysts. RIGHT inferior pole hypoechoic rounded lesion measuring 12 mm probably represents a hemorrhagic or proteinaceous cyst. Follow-up ultrasound in 6 months recommended to assess for stability.   Electronically Signed   By: Dereck Ligas M.D.   On: 07/27/2014 10:23   Nm Hepato W/eject Fract  07/27/2014   CLINICAL DATA:  RIGHT upper quadrant pain, history hypertension, mixed hyperlipidemia, diabetes, coronary artery disease, former smoker, prostate cancer  EXAM: NUCLEAR MEDICINE HEPATOBILIARY IMAGING WITH GALLBLADDER EF  TECHNIQUE: Sequential images of the abdomen were obtained out to 60 minutes following intravenous administration of radiopharmaceutical.  After slow intravenous infusion of micrograms Cholecystokinin, gallbladder ejection fraction was determined.  RADIOPHARMACEUTICALS:  5 mCi Tc-65m Choletec IV  COMPARISON:  None  FINDINGS: Normal tracer extraction from bloodstream indicating normal hepatocellular function.  Normal excretion of tracer into biliary tree.  Gallbladder visualized by 20 min.  Small bowel visualized by 28 min.  No focal hepatic retention of tracer.  Only mild emptying of tracer from the gallbladder occured following CCK stimulation.  Calculated gallbladder ejection fraction is 27%, mildly decreased.  Patient related no symptoms following CCK administration.  IMPRESSION: Patent biliary tree.  Mildly decreased gallbladder response to CCK stimulation with a decreased ejection fraction of 27%.   Electronically Signed   By: Lavonia Dana M.D.   On: 07/27/2014 15:55    Medications:  Scheduled: . amLODipine  10 mg Oral Daily  . heparin  5,000 Units Subcutaneous 3 times per day  . insulin aspart  0-15 Units Subcutaneous TID WC  . insulin aspart  0-5 Units Subcutaneous QHS  . pantoprazole  40 mg Oral Daily  . piperacillin-tazobactam (ZOSYN)  IV  3.375 g Intravenous Q8H  . simvastatin  20 mg Oral QHS   Continuous: . sodium chloride 50 mL/hr at 07/28/14 1354   GUY:[id number], acetaminophen, LORazepam, nitroGLYCERIN, ondansetron (ZOFRAN) IV, ondansetron  Assessment/Plan:  Active Problems:   Essential hypertension, benign   Diabetes mellitus   Encephalopathy acute   Abnormal LFTs   Altered mental status   Bacteremia    E coli Bacteremia  Concerning for biliary  process, acute cholangitis versus acute cholecystitis. Urine culture was negative. Continue Zosyn.   Acute cholangitis versus acute cholecystitis  Patient had fever, transaminitis, mild jaundice and RUQ pain, per ultrasound no CBD stones. HIDA report as above. GI is following. General surgery consulted. Continue Zosyn. Cardiology consulted for preoperative  evaluation. Patient has non obstructive CAD based on cath from July 2013. Has had on and of chest pain per cardiology notes. Will discuss with Dr. Arnoldo Morale regarding suitability of patient undergoing surgery here versus at [facility], after he has seen patient.  Possible UTI  Urine culture was negative. UA was abnormal.  Transaminitis  As above.   Acute encephalitis  Seems to be close to baseline. Confusion was secondary to acute illness, bacteremia.   History of HTN Stable. Continue to monitor  History of DM2 On SSI. Monitor CBG. Hold oral agents for now as oral intake is poor.  Code Status: Full Code  DVT Prophylaxis: Heparin    Family Communication: Discussed with son at bedside.  Disposition Plan: Not ready for discharge. Lives at home with family. Family refused SNF.    LOS: 3 days   [facility] Pager [phone] 07/29/2014, 7:32 AM  If 8PM-8AM, please contact night-coverage at [email or web address], password [email or web address]   Disclaimer: This note was dictated with voice recognition software. Similar sounding words can inadvertently be transcribed and may not be corrected upon review.   After much deliberation it was decided that patient will be best served by being transferred to [facility] for surgery. This was discussed with patient's family, Dr. Oneida Alar, Dr. Arnoldo Morale. Also discussed with [facility] at [facility] (Dr. Hulen Skains and Jinny Blossom) who will consult on patient.  Kavin Weckwerth 12:25 PM

## 2014-07-30 ENCOUNTER — Encounter (HOSPITAL_COMMUNITY): Payer: Medicare Other | Admitting: Certified Registered Nurse Anesthetist

## 2014-07-30 ENCOUNTER — Inpatient Hospital Stay (HOSPITAL_COMMUNITY): Payer: Medicare Other | Admitting: Certified Registered Nurse Anesthetist

## 2014-07-30 ENCOUNTER — Encounter (HOSPITAL_COMMUNITY)
Admission: EM | Disposition: A | Discharge status: Skilled Nursing Facility | Payer: Self-pay | Source: Home / Self Care | Attending: Internal Medicine

## 2014-07-30 ENCOUNTER — Encounter (HOSPITAL_COMMUNITY): Payer: Self-pay | Admitting: Physician Assistant

## 2014-07-30 ENCOUNTER — Inpatient Hospital Stay (HOSPITAL_COMMUNITY): Payer: Medicare Other

## 2014-07-30 DIAGNOSIS — K812 Acute cholecystitis with chronic cholecystitis: Secondary | ICD-10-CM

## 2014-07-30 DIAGNOSIS — K8020 Calculus of gallbladder without cholecystitis without obstruction: Secondary | ICD-10-CM

## 2014-07-30 DIAGNOSIS — R112 Nausea with vomiting, unspecified: Secondary | ICD-10-CM

## 2014-07-30 HISTORY — PX: CHOLECYSTECTOMY: SHX55

## 2014-07-30 LAB — GLUCOSE, CAPILLARY
GLUCOSE-CAPILLARY: 99 mg/dL (ref 70–99)
Glucose-Capillary: 81 mg/dL (ref 70–99)
Glucose-Capillary: 120 mg/dL — ABNORMAL HIGH (ref 70–99)
Glucose-Capillary: 107 mg/dL — ABNORMAL HIGH (ref 70–99)
Glucose-Capillary: 172 mg/dL — ABNORMAL HIGH (ref 70–99)

## 2014-07-30 SURGERY — LAPAROSCOPIC CHOLECYSTECTOMY WITH INTRAOPERATIVE CHOLANGIOGRAM
Anesthesia: General | Site: Abdomen

## 2014-07-30 MED ORDER — ROCURONIUM BROMIDE 50 MG/5ML IV SOLN
INTRAVENOUS | Status: AC
  Filled 2014-07-30: qty 1

## 2014-07-30 MED ORDER — FENTANYL CITRATE 0.05 MG/ML IJ SOLN
INTRAMUSCULAR | Status: DC | PRN
  Administered 2014-07-30 (×2): 50 ug via INTRAVENOUS
  Administered 2014-07-30: 25 ug via INTRAVENOUS
  Administered 2014-07-30 (×2): 50 ug via INTRAVENOUS

## 2014-07-30 MED ORDER — TRAMADOL HCL 50 MG PO TABS
50.0000 mg | ORAL_TABLET | Freq: Four times a day (QID) | ORAL | Status: DC | PRN
  Administered 2014-07-30 – 2014-08-04 (×3): 50 mg via ORAL
  Filled 2014-07-30 (×3): qty 1

## 2014-07-30 MED ORDER — ONDANSETRON HCL 4 MG/2ML IJ SOLN
INTRAMUSCULAR | Status: AC
  Filled 2014-07-30: qty 2

## 2014-07-30 MED ORDER — NEOSTIGMINE METHYLSULFATE 10 MG/10ML IV SOLN
INTRAVENOUS | Status: DC | PRN
  Administered 2014-07-30: 3 mg via INTRAVENOUS

## 2014-07-30 MED ORDER — PROPOFOL 10 MG/ML IV BOLUS
INTRAVENOUS | Status: DC | PRN
  Administered 2014-07-30: 20 mg via INTRAVENOUS
  Administered 2014-07-30: 80 mg via INTRAVENOUS

## 2014-07-30 MED ORDER — LACTATED RINGERS IV SOLN
INTRAVENOUS | Status: DC | PRN
  Administered 2014-07-30: 13:00:00 via INTRAVENOUS

## 2014-07-30 MED ORDER — EPHEDRINE SULFATE 50 MG/ML IJ SOLN
INTRAMUSCULAR | Status: DC | PRN
  Administered 2014-07-30 (×2): 10 mg via INTRAVENOUS
  Administered 2014-07-30: 5 mg via INTRAVENOUS

## 2014-07-30 MED ORDER — ONDANSETRON HCL 4 MG/2ML IJ SOLN
INTRAMUSCULAR | Status: DC | PRN
  Administered 2014-07-30: 4 mg via INTRAVENOUS

## 2014-07-30 MED ORDER — FENTANYL CITRATE 0.05 MG/ML IJ SOLN
INTRAMUSCULAR | Status: AC
Start: 2014-07-30 — End: 2014-07-30
  Filled 2014-07-30: qty 5

## 2014-07-30 MED ORDER — PROPOFOL 10 MG/ML IV BOLUS
INTRAVENOUS | Status: AC
  Filled 2014-07-30: qty 20

## 2014-07-30 MED ORDER — 0.9 % SODIUM CHLORIDE (POUR BTL) OPTIME
TOPICAL | Status: DC | PRN
  Administered 2014-07-30: 1000 mL

## 2014-07-30 MED ORDER — ROCURONIUM BROMIDE 100 MG/10ML IV SOLN
INTRAVENOUS | Status: DC | PRN
  Administered 2014-07-30: 30 mg via INTRAVENOUS

## 2014-07-30 MED ORDER — LIDOCAINE HCL (CARDIAC) 20 MG/ML IV SOLN
INTRAVENOUS | Status: AC
  Filled 2014-07-30: qty 5

## 2014-07-30 MED ORDER — BUPIVACAINE-EPINEPHRINE 0.25% -1:200000 IJ SOLN
INTRAMUSCULAR | Status: DC | PRN
  Administered 2014-07-30: 20 mL

## 2014-07-30 MED ORDER — LIDOCAINE HCL (CARDIAC) 20 MG/ML IV SOLN
INTRAVENOUS | Status: DC | PRN
  Administered 2014-07-30: 100 mg via INTRAVENOUS

## 2014-07-30 MED ORDER — SODIUM CHLORIDE 0.9 % IR SOLN
Status: DC | PRN
  Administered 2014-07-30: 1000 mL

## 2014-07-30 MED ORDER — GLYCOPYRROLATE 0.2 MG/ML IJ SOLN
INTRAMUSCULAR | Status: DC | PRN
  Administered 2014-07-30: 0.4 mg via INTRAVENOUS

## 2014-07-30 MED ORDER — PANTOPRAZOLE SODIUM 40 MG PO PACK
40.0000 mg | PACK | Freq: Every day | ORAL | Status: DC
  Administered 2014-07-30: 40 mg
  Filled 2014-07-30 (×2): qty 20

## 2014-07-30 MED ORDER — BUPIVACAINE-EPINEPHRINE (PF) 0.25% -1:200000 IJ SOLN
INTRAMUSCULAR | Status: AC
  Filled 2014-07-30: qty 30

## 2014-07-30 MED ORDER — SODIUM CHLORIDE 0.9 % IV SOLN
INTRAVENOUS | Status: DC | PRN
  Administered 2014-07-30: 14:00:00

## 2014-07-30 MED ORDER — OXYCODONE-ACETAMINOPHEN 5-325 MG PO TABS
1.0000 | ORAL_TABLET | Freq: Four times a day (QID) | ORAL | Status: DC | PRN
  Administered 2014-07-30: 2 via ORAL
  Filled 2014-07-30: qty 2

## 2014-07-30 SURGICAL SUPPLY — 47 items
APPLIER CLIP 5 13 M/L LIGAMAX5 (MISCELLANEOUS)
APPLIER CLIP ROT 10 11.4 M/L (STAPLE) ×3
BLADE CLIPPER SURG (BLADE) ×3 IMPLANT
CANISTER SUCTION 2500CC (MISCELLANEOUS) ×3 IMPLANT
CHLORAPREP W/TINT 26ML (MISCELLANEOUS) ×3 IMPLANT
CLIP APPLIE 5 13 M/L LIGAMAX5 (MISCELLANEOUS) IMPLANT
CLIP APPLIE ROT 10 11.4 M/L (STAPLE) ×1 IMPLANT
COVER MAYO STAND STRL (DRAPES) ×3 IMPLANT
COVER SURGICAL LIGHT HANDLE (MISCELLANEOUS) ×3 IMPLANT
DERMABOND ADVANCED (GAUZE/BANDAGES/DRESSINGS) ×2
DERMABOND ADVANCED .7 DNX12 (GAUZE/BANDAGES/DRESSINGS) ×1 IMPLANT
DRAPE C-ARM 42X72 X-RAY (DRAPES) ×3 IMPLANT
DRAPE UTILITY 15X26 W/TAPE STR (DRAPE) ×6 IMPLANT
DRSG TEGADERM 2-3/8X2-3/4 SM (GAUZE/BANDAGES/DRESSINGS) ×12 IMPLANT
ELECT REM PT RETURN 9FT ADLT (ELECTROSURGICAL) ×3
ELECTRODE REM PT RTRN 9FT ADLT (ELECTROSURGICAL) ×1 IMPLANT
GLOVE BIO SURGEON STRL SZ7.5 (GLOVE) ×3 IMPLANT
GLOVE BIOGEL PI IND STRL 6.5 (GLOVE) ×1 IMPLANT
GLOVE BIOGEL PI IND STRL 7.0 (GLOVE) ×2 IMPLANT
GLOVE BIOGEL PI IND STRL 7.5 (GLOVE) ×1 IMPLANT
GLOVE BIOGEL PI IND STRL 8 (GLOVE) ×1 IMPLANT
GLOVE BIOGEL PI INDICATOR 6.5 (GLOVE) ×2
GLOVE BIOGEL PI INDICATOR 7.0 (GLOVE) ×4
GLOVE BIOGEL PI INDICATOR 7.5 (GLOVE) ×2
GLOVE BIOGEL PI INDICATOR 8 (GLOVE) ×2
GLOVE ECLIPSE 7.5 STRL STRAW (GLOVE) ×3 IMPLANT
GLOVE SURG SS PI 7.0 STRL IVOR (GLOVE) ×3 IMPLANT
GOWN STRL REUS W/ TWL LRG LVL3 (GOWN DISPOSABLE) ×4 IMPLANT
GOWN STRL REUS W/TWL LRG LVL3 (GOWN DISPOSABLE) ×8
KIT BASIN OR (CUSTOM PROCEDURE TRAY) ×3 IMPLANT
KIT ROOM TURNOVER OR (KITS) ×3 IMPLANT
NS IRRIG 1000ML POUR BTL (IV SOLUTION) ×3 IMPLANT
PAD ARMBOARD 7.5X6 YLW CONV (MISCELLANEOUS) ×3 IMPLANT
POUCH SPECIMEN RETRIEVAL 10MM (ENDOMECHANICALS) ×3 IMPLANT
SCISSORS LAP 5X35 DISP (ENDOMECHANICALS) ×3 IMPLANT
SET CHOLANGIOGRAPH 5 50 .035 (SET/KITS/TRAYS/PACK) ×3 IMPLANT
SET IRRIG TUBING LAPAROSCOPIC (IRRIGATION / IRRIGATOR) ×3 IMPLANT
SLEEVE ENDOPATH XCEL 5M (ENDOMECHANICALS) ×3 IMPLANT
SPECIMEN JAR SMALL (MISCELLANEOUS) ×3 IMPLANT
SUT MNCRL AB 4-0 PS2 18 (SUTURE) ×3 IMPLANT
SYRINGE CONTROL L 12CC (SYRINGE) ×3 IMPLANT
TOWEL OR 17X24 6PK STRL BLUE (TOWEL DISPOSABLE) ×3 IMPLANT
TOWEL OR 17X26 10 PK STRL BLUE (TOWEL DISPOSABLE) ×3 IMPLANT
TRAY LAPAROSCOPIC (CUSTOM PROCEDURE TRAY) ×3 IMPLANT
TROCAR XCEL BLUNT TIP 100MML (ENDOMECHANICALS) ×3 IMPLANT
TROCAR XCEL NON-BLD 11X100MML (ENDOMECHANICALS) ×3 IMPLANT
TROCAR XCEL NON-BLD 5MMX100MML (ENDOMECHANICALS) ×3 IMPLANT

## 2014-07-30 NOTE — Progress Notes (Signed)
Spoke with daughter De Blanch who has the health care power of attorney since there was not a copy on the patient's chart. Copy made and put on chart.

## 2014-07-30 NOTE — Progress Notes (Signed)
PT Cancellation Note  Patient Details Name: CLESTON LAUTNER MRN: [id number] DOB: December 14, 1926   Cancelled Treatment:    Reason Eval/Treat Not Completed: Medical issues which prohibited therapy. Pt scheduled for surgery and nurse requesting to see pt tomorrow after surgery.   Earlena Werst LUBECK 07/30/2014, 11:10 AM

## 2014-07-30 NOTE — Discharge Instructions (Signed)
Your appointment is at 3:15pm, please arrive at least 30 min before your appointment to complete your check in paperwork.  If you are unable to arrive 30 min prior to your appointment time we may have to cancel or reschedule you.  LAPAROSCOPIC SURGERY: POST OP INSTRUCTIONS  1. DIET: Follow a light bland diet the first 24 hours after arrival home, such as soup, liquids, crackers, etc. Be sure to include lots of fluids daily. Avoid fast food or heavy meals as your are more likely to get nauseated. Eat a low fat the next few days after surgery.  2. Take your usually prescribed home medications unless otherwise directed. 3. PAIN CONTROL:  a. Pain is best controlled by a usual combination of three different methods TOGETHER:  i. Ice/Heat ii. Over the counter pain medication iii. Prescription pain medication b. Most patients will experience some swelling and bruising around the incisions. Ice packs or heating pads (30-60 minutes up to 6 times a day) will help. Use ice for the first few days to help decrease swelling and bruising, then switch to heat to help relax tight/sore spots and speed recovery. Some people prefer to use ice alone, heat alone, alternating between ice & heat. Experiment to what works for you. Swelling and bruising can take several weeks to resolve.  c. It is helpful to take an over-the-counter pain medication regularly for the first few weeks. Choose one of the following that works best for you:  i. Naproxen (Aleve, etc) Two 220mg  tabs twice a day ii. Ibuprofen (Advil, etc) Three 200mg  tabs four times a day (every meal & bedtime) iii. Acetaminophen (Tylenol, etc) 500-650mg  four times a day (every meal & bedtime) d. A prescription for pain medication (such as oxycodone, hydrocodone, etc) should be given to you upon discharge. Take your pain medication as prescribed.  i. If you are having problems/concerns with the prescription medicine (does not control pain, nausea, vomiting, rash,  itching, etc), please call us [phone] to see if we need to switch you to a different pain medicine that will work better for you and/or control your side effect better. ii. If you need a refill on your pain medication, please contact your pharmacy. They will contact our office to request authorization. Prescriptions will not be filled after 5 pm or on week-ends. 4. Avoid getting constipated. Between the surgery and the pain medications, it is common to experience some constipation. Increasing fluid intake and taking a fiber supplement (such as Metamucil, Citrucel, FiberCon, MiraLax, etc) 1-2 times a day regularly will usually help prevent this problem from occurring. A mild laxative (prune juice, Milk of Magnesia, MiraLax, etc) should be taken according to package directions if there are no bowel movements after 48 hours.  5. Watch out for diarrhea. If you have many loose bowel movements, simplify your diet to bland foods & liquids for a few days. Stop any stool softeners and decrease your fiber supplement. Switching to mild anti-diarrheal medications (Kayopectate, Pepto Bismol) can help. If this worsens or does not improve, please call us. 6. Wash / shower every day. You may shower over the dressings as they are waterproof. Continue to shower over incision(s) after the dressing is off. 7. Remove your waterproof bandages 5 days after surgery. You may leave the incision open to air. You may replace a dressing/Band-Aid to cover the incision for comfort if you wish.  8. ACTIVITIES as tolerated:  a. You may resume regular (light) daily activities beginning the next day--such as  daily self-care, walking, climbing stairs--gradually increasing activities as tolerated. If you can walk 30 minutes without difficulty, it is safe to try more intense activity such as jogging, treadmill, bicycling, low-impact aerobics, swimming, etc. b. Save the most intensive and strenuous activity for last such as sit-ups, heavy  lifting, contact sports, etc Refrain from any heavy lifting or straining until you are off narcotics for pain control.  c. DO NOT PUSH THROUGH PAIN. Let pain be your guide: If it hurts to do something, don't do it. Pain is your body warning you to avoid that activity for another week until the pain goes down. d. You may drive when you are no longer taking prescription pain medication, you can comfortably wear a seatbelt, and you can safely maneuver your car and apply brakes. e. Dennis Bast may have sexual intercourse when it is comfortable.  9. FOLLOW UP in our office  a. Please call [facility] at [phone] to set up an appointment to see your surgeon in the office for a follow-up appointment approximately 2-3 weeks after your surgery. b. Make sure that you call for this appointment the day you arrive home to insure a convenient appointment time.      10. IF YOU HAVE DISABILITY OR FAMILY LEAVE FORMS, BRING THEM TO THE               OFFICE FOR PROCESSING.   WHEN TO CALL us [phone]:  1. Poor pain control 2. Reactions / problems with new medications (rash/itching, nausea, etc)  3. Fever over 101.5 F (38.5 C) 4. Inability to urinate 5. Nausea and/or vomiting 6. Worsening swelling or bruising 7. Continued bleeding from incision. 8. Increased pain, redness, or drainage from the incision  The clinic staff is available to answer your questions during regular business hours (8:30am-5pm). Please dont hesitate to call and ask to speak to one of our nurses for clinical concerns.  If you have a medical emergency, go to the nearest emergency room or call 911.  A surgeon from [facility] is always on call at the [facility], [place], [place], [place], [place] [address] ?  MAIN: [phone] ? TOLL FREE: [phone] ?  FAX [phone]  [email or web address]

## 2014-07-30 NOTE — Progress Notes (Signed)
ANTIBIOTIC CONSULT NOTE-Preliminary  Pharmacy Consult for Zosyn Indication: Intra-abdominal infection/Ecoli bacteremia  Allergies  Allergen Reactions  . Codeine   . Aspirin     Can not tolerate in high dosage   Patient Measurements: Height: 5\' 2"  (157.5 cm) Weight: 139 lb 12.4 oz (63.4 kg) IBW/kg (Calculated) : 54.6  Vital Signs: Temp: 98 F (36.7 C) (07/31 1024) Temp src: Oral (07/31 1024) BP: 153/68 mmHg (07/31 1024) Pulse Rate: 112 (07/31 1024)  Labs:  Recent Labs  07/28/14 0241 07/29/14 0601  WBC 8.1 8.9  HGB 14.3 12.9*  PLT 132* 131*  CREATININE 1.38* 1.14   Estimated Creatinine Clearance: 35.9 ml/min (by C-G formula based on Cr of 1.14).  No results found for this basename: VANCOTROUGH, Corlis Leak, VANCORANDOM, GENTTROUGH, GENTPEAK, GENTRANDOM, TOBRATROUGH, TOBRAPEAK, TOBRARND, AMIKACINPEAK, AMIKACINTROU, AMIKACIN,  in the last 72 hours   Microbiology: Recent Results (from the past 720 hour(s))  URINE CULTURE     Status: None   Collection Time    07/26/14  7:52 PM      Result Value Ref Range Status   Specimen Description URINE, CATHETERIZED   Final   Special Requests NONE   Final   Culture  Setup Time     Final   Value: 07/27/2014 14:28     Performed at [facility]     Final   Value: NO GROWTH     Performed at [facility]   Culture     Final   Value: NO GROWTH     Performed at [facility]   Report Status 07/28/2014 FINAL   Final  CULTURE, BLOOD (ROUTINE X 2)     Status: None   Collection Time    07/26/14  8:11 PM      Result Value Ref Range Status   Specimen Description BLOOD LEFT FOREARM   Final   Special Requests BOTTLES DRAWN AEROBIC AND ANAEROBIC [place]   Final   Culture  Setup Time     Final   Value: 07/27/2014 19:14     Performed at [facility]   Culture     Final   Value: ESCHERICHIA COLI     Note: SUSCEPTIBILITIES PERFORMED ON PREVIOUS CULTURE WITHIN THE LAST 5 DAYS.     Note: Gram  Stain Report Called to,Read Back By and Verified With: DICKERSONM 1110 07/27/14 BY Elza Rafter Performed at [facility]     Performed at [facility]   Report Status 07/29/2014 FINAL   Final  CULTURE, BLOOD (ROUTINE X 2)     Status: None   Collection Time    07/26/14  8:11 PM      Result Value Ref Range Status   Specimen Description BLOOD LEFT ANTECUBITAL   Final   Special Requests     Final   Value: BOTTLES DRAWN AEROBIC AND ANAEROBIC AEB=6CC ANA=4CC   Culture  Setup Time     Final   Value: 07/27/2014 18:58     Performed at [facility]   Culture     Final   Value: ESCHERICHIA COLI     Note: Gram Stain Report Called to,Read Back By and Verified With: Cory Roughen 1110 07/27/14 BY Elza Rafter Performed at [facility]     Performed at [facility]   Report Status 07/29/2014 FINAL   Final   Organism ID, Bacteria ESCHERICHIA COLI   Final  CULTURE, BLOOD (ROUTINE X 2)  Status: None   Collection Time    07/28/14  2:41 AM      Result Value Ref Range Status   Specimen Description BLOOD LEFT FOREARM   Final   Special Requests BOTTLES DRAWN AEROBIC AND ANAEROBIC 6CC EACH   Final   Culture NO GROWTH <24 HRS   Final   Report Status PENDING   Incomplete  CULTURE, BLOOD (ROUTINE X 2)     Status: None   Collection Time    07/28/14  2:41 AM      Result Value Ref Range Status   Specimen Description BLOOD LEFT HAND   Final   Special Requests     Final   Value: BOTTLES DRAWN AEROBIC AND ANAEROBIC AEB 6CC ANA 8CC   Culture NO GROWTH <24 HRS   Final   Report Status PENDING   Incomplete   Medical History: Past Medical History  Diagnosis Date  . Essential hypertension, benign   . Mixed hyperlipidemia   . Near syncope   . CAD (coronary artery disease)     Nonobstructive by cath 06/2012  . Bradycardia   . Diabetes mellitus without complication   . Prostate cancer   . Anxiety   . GERD (gastroesophageal reflux disease)   . Seizures   . History of  echocardiogram     a. 2D ECHO: 07/29/2014  EF 60-65%, no RWMAs. G1DD. Mild TR with  PASP 31 mmHg.   Medications:  Vancomycin 1 Gm IV in the ED 7/27 Zosyn 3.375 Gm IV in the ED 07/26/14  Assessment: 80 yo male seen in the ED with nausea/vomiting and altered mental status. Pt has elevated LFTs and WBCs; abnormal urinalysis. Empiric antibiotics for intra-abdominal infection; and found with Ecoli bacteremia.  Pan-sensitive except resistant to ampicillin/unasyn.  Goal of Therapy:  Eradicate infection  Plan:  Zosyn 3.375gm IV q8h, each dose over 4 hrs Monitor labs, progress, renal fxn, and cultures  Consider narrowing abx to ceftriaxone or cipro?  Uvaldo Rising, BCPS  Clinical Pharmacist Pager [phone]  07/30/2014 11:13 AM

## 2014-07-30 NOTE — Progress Notes (Signed)
OT Cancellation Note  Patient Details Name: Kenneth Potter MRN: [id number] DOB: 09-01-1926   Cancelled Treatment:    Reason Eval/Treat Not Completed: Patient at procedure or test/ unavailable. Pt in surgery.  Almon Register [id number] 07/30/2014, 1:24 PM
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
## 2014-07-30 NOTE — Progress Notes (Signed)
[facility] PROGRESS NOTE  Kenneth Potter KDX:[id number] DOB: [birth year] DOA: 07/26/2014  PCP: [name], MD  Brief HPI: 80yo male presented with nausea/vomiting and altered mental status. He was noted to have a fever in the ED. Possible UTI versus cholangitis due to abnormal LFT. Plan is for cholecystectomy.  Past medical history:  Past Medical History  Diagnosis Date  . Essential hypertension, benign   . Mixed hyperlipidemia   . Near syncope   . CAD (coronary artery disease)     Nonobstructive by cath 06/2012  . Bradycardia   . Diabetes mellitus without complication   . Prostate cancer   . Anxiety   . GERD (gastroesophageal reflux disease)   . Seizures   . History of echocardiogram     a. 2D ECHO: 07/29/2014  EF 60-65%, no RWMAs. G1DD. Mild TR with  PASP 31 mmHg.    Consultants: GI, General Surgery, Cardiology  Procedures:  2D ECHO:  Impressions:- Normal LV wall thickness with LVEF 97-67%, grade 1 diastolic dysfunction. Trivial mitral and aortic regurgitation. Mild tricuspid regurgitation with PASP 31 mmHg.  Antibiotics: Zosyn 7/27-->  Subjective: Patient asking questions about surgery and that if it is needed. i answered his questions. He is now agreeable to undergo surgery. Some left sided abdominal discomfort. Denies nausea. Is hungry.   Objective: Vital Signs  Filed Vitals:   07/29/14 0640 07/29/14 1537 07/29/14 1934 07/30/14 0500  BP: 138/61 141/68 142/66 133/61  Pulse: 89 45 68 102  Temp: 97.9 F (36.6 C) 99 F (37.2 C) 98.3 F (36.8 C) 98.3 F (36.8 C)  TempSrc: Oral Oral Oral Oral  Resp: 20 20 20 18   Height:   5\' 2"  (1.575 m)   Weight:   63.4 kg (139 lb 12.4 oz)   SpO2: 98% 98% 98% 97%    Intake/Output Summary (Last 24 hours) at 07/30/14 0933 Last data filed at 07/30/14 0600  Gross per 24 hour  Intake    320 ml  Output      0 ml  Net    320 ml   Filed Weights   07/26/14 2327 07/28/14 0708 07/29/14 1934  Weight: 62.2 kg (137 lb 2  oz) 61.1 kg (134 lb 11.2 oz) 63.4 kg (139 lb 12.4 oz)    General appearance: Awake and alert Resp: clear to auscultation bilaterally Cardio: regular rate and rhythm, S1, S2 normal, no murmur, click, rub or gallop GI: soft, non-tender; bowel sounds normal; no masses,  no organomegaly Extremities: No edema Pulses: 2+ and symmetric Neurologic: Awake and alert. No focal deficits.   Lab Results:  Basic Metabolic Panel:  Recent Labs Lab 07/26/14 1929 07/27/14 0516 07/28/14 0241 07/29/14 0601  NA 139 141 139 139  K 3.6* 3.4* 3.6* 3.3*  CL 101 105 103 106  CO2 21 23 18* 23  GLUCOSE 169* 113* 150* 104*  BUN 19 16 19 14   CREATININE 1.30 1.18 1.38* 1.14  CALCIUM 9.1 8.9 8.7 8.5   Liver Function Tests:  Recent Labs Lab 07/26/14 2130 07/27/14 0516 07/28/14 0241 07/29/14 0601  AST 180* 114* 64* 45*  ALT 170* 131* 91* 56*  ALKPHOS 132* 132* 104 77  BILITOT 2.1* 2.1* 2.0* 1.0  PROT 7.2 6.8 6.9 6.3  ALBUMIN 3.5 3.3* 3.1* 2.6*    Recent Labs Lab 07/26/14 2130  LIPASE 9*   CBC:  Recent Labs Lab 07/26/14 1929 07/27/14 0516 07/28/14 0241 07/29/14 0601  WBC 11.8* 10.9* 8.1 8.9  NEUTROABS 10.3*  --   --   --  HGB 14.3 14.0 14.3 12.9*  HCT 39.5 39.4 38.9* 35.5*  MCV 92.5 93.1 91.5 91.5  PLT 162 159 132* 131*   CBG:  Recent Labs Lab 07/29/14 0717 07/29/14 1152 07/29/14 1639 07/29/14 2221 07/30/14 0810  GLUCAP 80 84 87 124* 99    Recent Results (from the past 240 hour(s))  URINE CULTURE     Status: None   Collection Time    07/26/14  7:52 PM      Result Value Ref Range Status   Specimen Description URINE, CATHETERIZED   Final   Special Requests NONE   Final   Culture  Setup Time     Final   Value: 07/27/2014 14:28     Performed at [facility]     Final   Value: NO GROWTH     Performed at [facility]   Culture     Final   Value: NO GROWTH     Performed at [facility]   Report Status 07/28/2014 FINAL   Final    CULTURE, BLOOD (ROUTINE X 2)     Status: None   Collection Time    07/26/14  8:11 PM      Result Value Ref Range Status   Specimen Description BLOOD LEFT FOREARM   Final   Special Requests BOTTLES DRAWN AEROBIC AND ANAEROBIC [facility] EACH   Final   Culture  Setup Time     Final   Value: 07/27/2014 19:14     Performed at [facility]   Culture     Final   Value: ESCHERICHIA COLI     Note: SUSCEPTIBILITIES PERFORMED ON PREVIOUS CULTURE WITHIN THE LAST 5 DAYS.     Note: Gram Stain Report Called to,Read Back By and Verified With: DICKERSONM 1110 07/27/14 BY Elza Rafter Performed at [facility]     Performed at [facility]   Report Status 07/29/2014 FINAL   Final  CULTURE, BLOOD (ROUTINE X 2)     Status: None   Collection Time    07/26/14  8:11 PM      Result Value Ref Range Status   Specimen Description BLOOD LEFT ANTECUBITAL   Final   Special Requests     Final   Value: BOTTLES DRAWN AEROBIC AND ANAEROBIC AEB=6CC ANA=4CC   Culture  Setup Time     Final   Value: 07/27/2014 18:58     Performed at [facility]   Culture     Final   Value: ESCHERICHIA COLI     Note: Gram Stain Report Called to,Read Back By and Verified With: Cory Roughen 1110 07/27/14 BY Elza Rafter Performed at [facility]     Performed at [facility]   Report Status 07/29/2014 FINAL   Final   Organism ID, Bacteria ESCHERICHIA COLI   Final  CULTURE, BLOOD (ROUTINE X 2)     Status: None   Collection Time    07/28/14  2:41 AM      Result Value Ref Range Status   Specimen Description BLOOD LEFT FOREARM   Final   Special Requests BOTTLES DRAWN AEROBIC AND ANAEROBIC 6CC EACH   Final   Culture NO GROWTH <24 HRS   Final   Report Status PENDING   Incomplete  CULTURE, BLOOD (ROUTINE X 2)     Status: None   Collection Time    07/28/14  2:41 AM      Result Value Ref  Range Status   Specimen Description BLOOD LEFT HAND   Final   Special Requests     Final   Value: BOTTLES DRAWN  AEROBIC AND ANAEROBIC AEB 6CC ANA 8CC   Culture NO GROWTH <24 HRS   Final   Report Status PENDING   Incomplete      Studies/Results: No results found.  Medications:  Scheduled: . amLODipine  10 mg Oral Daily  . insulin aspart  0-15 Units Subcutaneous TID WC  . insulin aspart  0-5 Units Subcutaneous QHS  . pantoprazole sodium  40 mg Per Tube Daily  . piperacillin-tazobactam (ZOSYN)  IV  3.375 g Intravenous Q8H  . simvastatin  20 mg Oral QHS   Continuous:   LAG:[id number], acetaminophen, LORazepam, nitroGLYCERIN, ondansetron (ZOFRAN) IV, ondansetron  Assessment/Plan:  Active Problems:   Essential hypertension, benign   Diabetes mellitus   Encephalopathy acute   Abnormal LFTs   Altered mental status   Bacteremia   Preoperative cardiovascular examination   Acute cholecystitis    E coli Bacteremia  From biliary process: acute cholangitis versus acute cholecystitis. Urine culture was negative. Continue Zosyn for now.   Acute cholangitis versus acute cholecystitis  Patient had fever, transaminitis, mild jaundice and RUQ pain, per ultrasound no CBD stones. HIDA report as above. GI was consulted. And then General surgery was consulted as well. Plan is for cholecystectomy. Cardiology consulted for preoperative evaluation and they deem him low to intermediate risk. ECHO report was reviewed. Normal EF without wall motion abnormalities. Patient has non obstructive CAD based on cath from July 2013. Patient was transferred to [facility] due to inconsistent surgical coverage at [facility] for next 10 days. Await evaluation by Dr. Hulen Skains. Possible surgery later today. Discussed with patient this Am. All questions answered. Apparently he was having second thoughts about surgery. He is now agreeable to proceed with surgery.  Possible UTI  Urine culture was negative. UA was abnormal.  Transaminitis  As above.   Acute encephalitis  Seems to be close to baseline. Confusion was secondary to acute  illness, bacteremia.   History of HTN Stable. Continue to monitor  History of DM2 On SSI. Monitor CBG. Hold oral agents for now as oral intake is poor.  Code Status: Full Code  DVT Prophylaxis: Heparin    Family Communication: Discussed with patient and daughters.  Disposition Plan: Not ready for discharge. Lives at home with family. Family refused SNF. Continue PT/OT.    LOS: 4 days   [facility] Pager [phone] 07/30/2014, 9:33 AM  If 8PM-8AM, please contact night-coverage at [email or web address], password [email or web address]   Disclaimer: This note was dictated with voice recognition software. Similar sounding words can inadvertently be transcribed and may not be corrected upon review.

## 2014-07-30 NOTE — Anesthesia Postprocedure Evaluation (Signed)
  Anesthesia Post-op Note  Patient: Kenneth Potter  Procedure(s) Performed: Procedure(s): LAPAROSCOPIC CHOLECYSTECTOMY WITH INTRAOPERATIVE CHOLANGIOGRAM (N/A)  Patient Location: PACU  Anesthesia Type:General  Level of Consciousness: awake  Airway and Oxygen Therapy: Patient Spontanous Breathing  Post-op Pain: none  Post-op Assessment: Post-op Vital signs reviewed, Patient's Cardiovascular Status Stable, Respiratory Function Stable, Patent Airway, No signs of Nausea or vomiting and Pain level controlled  Post-op Vital Signs: Reviewed and stable  Last Vitals:  Filed Vitals:   07/30/14 1530  BP: 117/57  Pulse: 78  Temp: 37.1 C  Resp: 19    Complications: No apparent anesthesia complications

## 2014-07-30 NOTE — Care Management Note (Signed)
CARE MANAGEMENT NOTE 07/30/2014  Patient:  Kenneth Potter, Kenneth Potter   Account Number:  [id number]  Date Initiated:  07/27/2014  Documentation initiated by:  CHILDRESS,JESSICA  Subjective/Objective Assessment:   Pt admitted with AMS. Pt lives with his daughter who assists with ADL's. Pt has shower chair and walker for PRN use.     Action/Plan:   PT consult obtained. Will continue to follow for discharge planning needs.  07/30/2014 For Lap Choly today, met with family no d/c needs identified, will reeval for needs post op.   Anticipated DC Date:  08/01/2014   Anticipated DC Plan:    In-house referral  Clinical Social Worker      DC Planning Services  CM consult      Choice offered to / List presented to:             Status of service:  In process, will continue to follow Medicare Important Message given?  YES (If response is "NO", the following Medicare IM given date fields will be blank) Date Medicare IM given:  07/28/2014 Medicare IM given by:  Vladimir Creeks Date Additional Medicare IM given:  07/30/2014 Additional Medicare IM given by:  [facility]  Discharge Disposition:    Per UR Regulation:  Reviewed for med. necessity/level of care/duration of stay  If discussed at [place] of Stay Meetings, dates discussed:    Comments:    07/30/2014 Met with pt and family, as they await Lap Choly to be performed today. No HH needs identified at this time, will follow for needs post op. Noted pt has walker and shower chair per family. Will arranged [place] if needed post op. Second IM given for possible d/c over the weekend. Jasmine Pang RN MPH, case manager, [phone]  07/28/14 1500 Vladimir Creeks RN/CM    Daughter spoke with CSW today and she and family adamantly refused SNF. They will take the pt home. Spoke with daughter at bedside and she states they have never needed help such as [place] before but may need help now as the pt is much more dependent, and unless he improves greatly, will need more care.  Explained that CM will be glad to set up [facility] if needed, and she thinks they may need a hosp bed. They are having a meeting with MD later this PM and will be able to make more decisions then 07/27/2014 [place], RN, MSN, [facility]

## 2014-07-30 NOTE — Transfer of Care (Signed)
Immediate Anesthesia Transfer of Care Note  Patient: Kenneth Potter  Procedure(s) Performed: Procedure(s): LAPAROSCOPIC CHOLECYSTECTOMY WITH INTRAOPERATIVE CHOLANGIOGRAM (N/A)  Patient Location: PACU  Anesthesia Type:General  Level of Consciousness: sedated  Airway & Oxygen Therapy: Patient Spontanous Breathing and Patient connected to nasal cannula oxygen  Post-op Assessment: Report given to PACU RN and Post -op Vital signs reviewed and stable  Post vital signs: Reviewed and stable  Complications: No apparent anesthesia complications

## 2014-07-30 NOTE — Consult Note (Signed)
Patient is agreeable to surgery and his daughter is his power of attorney.  Papers to that affect have been supplied.  Okay to go to OR today for possible lap chole with IOC.  Possible open because of prior gastric surgery.  Kathryne Eriksson. Dahlia Bailiff, MD, [place] [phone] [facility]

## 2014-07-30 NOTE — Op Note (Addendum)
OPERATIVE REPORT  DATE OF OPERATION:  07/30/2014  PATIENT:  Kenneth Potter  80 y.o. male  PRE-OPERATIVE DIAGNOSIS:  ACUTE CHOLECYSTITIS  POST-OPERATIVE DIAGNOSIS:  ACUTE CHOLECYSTITIS/ABDOMINAL ADHESIONS  PROCEDURE:  Procedure(s): LAPAROSCOPIC CHOLECYSTECTOMY WITH INTRAOPERATIVE CHOLANGIOGRAM LAPAROSCOPIC LYSIS OF ADHESIONS  SURGEON:  Surgeon(s): Gwenyth Ober, MD  ASSISTANTDeon Pilling, RNFA  ANESTHESIA:   general  EBL: <20 ml  BLOOD ADMINISTERED: none  DRAINS: none   SPECIMEN:  Source of Specimen:  Gallbladder  COUNTS CORRECT:  YES  PROCEDURE DETAILS: The patient was taken to the operating room and placed on the table in the supine position.  After an adequate endotracheal anesthetic was administered, the patient was prepped with ChloroPrep, and then draped in the usual manner exposing the entire abdomen laterally, inferiorly and up  to the costal margins.  After a proper timeout was performed including identifying the patient and the procedure to be performed, a infraumbilical 5.6EP midline incision was made using a #15 blade.  This was taken down to the fascia which was then incised with a #15 blade.  The edges of the fascia were tented up with Kocher clamps as the preperitoneal space was penetrated with a Kelly clamp into the peritoneum.  Once this was done, a pursestring suture of 0 Vicryl was passed around the fascial opening.  This was subsequently used to secure the [facility] cannula which was passed into the peritoneal cavity.  Once the [facility] cannula was in place, carbon dioxide gas was insufflated into the peritoneal cavity up to a maximal intra-abdominal pressure of 18mm Hg.The laparoscope, with attached camera and light source, was passed into the peritoneal cavity to visualize the direct insertion of two right upper quadrant 26mm cannulas, and a sup-xiphoid 65mm cannula, this was just to the left of midline.  Once all cannulas were in place, the dissection was begun.  The  patient had significant adhesions from a prior operation for stomach disease. This led to a significant amount of adhesions of the omentum to the anterior abdominal wall in the right upper quadrant. Using electrocautery and laparoscopic scissors these adhesions were taken down from the anterior abdominal wall thickening of approximately 1/2 hour of operating time.   Current significant adhesions to the gallbladder itself which had to be taken down using sharp and blunt dissection with electrocautery. This helped free up an acutely inflamed gallbladder which was subsequently dissected out.  Two ratcheted graspers were attached to the dome and infundibulum of the gallbladder and retracted towards the anterior abdominal wall and the right upper quadrant.  Using cautery attached to a dissecting forceps, the peritoneum overlaying the triangle of Chalot and the hepatoduodenal triangle was dissected away exposing the cystic duct and the cystic artery.  The cystic artery was clipped proximally and distally then transected.  A clip was placed on the gallbladder side of the cystic duct, then a cholecystodochotomy made using the laparoscopic scissors.  Through the cholecystodochotomy a Cook catheter was passed to performed a cholangiogram.  The cholangiogram showed A dilated common bile duct common good proximal and distal flow, and good flow into the duodenum.  Once the cholangiogram was completed, the [facility] catheter was removed, and the distal cystic duct was clipped multiple times then transected.  The gallbladder was then dissected out of the hepatic bed without event.  It was retrieved from the abdomen (using an EndoCatch bag) without event.  Once the gallbladder was removed, the bed was inspected for hemostasis.  Once excellent hemostasis was obtained all  gas and fluids were aspirated from above the liver, then the cannulas were removed.  The infraumbilical incision was closed using the pursestring suture which  was in place.  0.25% bupivicaine with epinephrine was injected at all sites.  All 44mm or greater cannula sites were close using a running subcuticular stitch of 4-0 Monocryl.  5.39mm cannula sites were closed with Dermabond only.Steri-Strips and Tagaderm were used to complete the dressings at all sites.  At this point all needle, sponge, and instrument counts were correct.The patient was awakened from anesthesia and taken to the PACU in stable condition.  PATIENT DISPOSITION:  PACU - hemodynamically stable.   Gwenyth Ober 7/31/20152:59 PM

## 2014-07-30 NOTE — Progress Notes (Signed)
Patient Name: Kenneth Potter Date of Encounter: 07/30/2014     Active Problems:   Essential hypertension, benign   Diabetes mellitus   Encephalopathy acute   Abnormal LFTs   Altered mental status   Bacteremia   Preoperative cardiovascular examination   Acute cholecystitis    SUBJECTIVE  Patient not very verbal, mildly demented. Does have some aching in abdomen. No CP or SOB.   CURRENT MEDS . amLODipine  10 mg Oral Daily  . insulin aspart  0-15 Units Subcutaneous TID WC  . insulin aspart  0-5 Units Subcutaneous QHS  . pantoprazole  40 mg Oral Daily  . piperacillin-tazobactam (ZOSYN)  IV  3.375 g Intravenous Q8H  . simvastatin  20 mg Oral QHS    OBJECTIVE  Filed Vitals:   07/29/14 0640 07/29/14 1537 07/29/14 1934 07/30/14 0500  BP: 138/61 141/68 142/66 133/61  Pulse: 89 45 68 102  Temp: 97.9 F (36.6 C) 99 F (37.2 C) 98.3 F (36.8 C) 98.3 F (36.8 C)  TempSrc: Oral Oral Oral Oral  Resp: 20 20 20 18   Height:   5\' 2"  (1.575 m)   Weight:   139 lb 12.4 oz (63.4 kg)   SpO2: 98% 98% 98% 97%    Intake/Output Summary (Last 24 hours) at 07/30/14 0857 Last data filed at 07/30/14 0600  Gross per 24 hour  Intake    370 ml  Output      0 ml  Net    370 ml   Filed Weights   07/26/14 2327 07/28/14 0708 07/29/14 1934  Weight: 137 lb 2 oz (62.2 kg) 134 lb 11.2 oz (61.1 kg) 139 lb 12.4 oz (63.4 kg)    PHYSICAL EXAM  General: Pleasant, NAD. Not very verbal. Resting comfortably without complaint. Neuro: Alert and oriented X 3. Moves all extremities spontaneously. Psych: Normal affect. HEENT:  Normal  Neck: Supple without bruits or JVD. Lungs:  Resp regular and unlabored, CTA. Heart: RRR no s3, s4, or murmurs. Abdomen: Soft, non-tender, non-distended, BS + x 4.  Extremities: No clubbing, cyanosis or edema. DP/PT/Radials 2+ and equal bilaterally.  Accessory Clinical Findings  CBC  Recent Labs  07/28/14 0241 07/29/14 0601  WBC 8.1 8.9  HGB 14.3 12.9*  HCT  38.9* 35.5*  MCV 91.5 91.5  PLT 132* 322*   Basic Metabolic Panel  Recent Labs  07/28/14 0241 07/29/14 0601  NA 139 139  K 3.6* 3.3*  CL 103 106  CO2 18* 23  GLUCOSE 150* 104*  BUN 19 14  CREATININE 1.38* 1.14  CALCIUM 8.7 8.5   Liver Function Tests  Recent Labs  07/28/14 0241 07/29/14 0601  AST 64* 45*  ALT 91* 56*  ALKPHOS 104 77  BILITOT 2.0* 1.0  PROT 6.9 6.3  ALBUMIN 3.1* 2.6*    TELE  sinus with freq PACs and PVCs: HR 60s  Radiology/Studies   Dg Chest Portable 1 View  07/26/2014   CLINICAL DATA:  Altered mental status, fever, history of prostate cancer  EXAM: PORTABLE CHEST - 1 VIEW  COMPARISON:  04/15/2014  FINDINGS: Low lung volumes with mild bibasilar opacities, likely atelectasis. No focal consolidation. No pleural effusion or pneumothorax.  The heart is top-normal in size.  Right shoulder arthroplasty.  IMPRESSION: Low lung volumes with mild bibasilar opacities, likely atelectasis.  No evidence of acute cardiopulmonary disease.     2D ECHO: 07/29/2014   LV EF: 60% - 65% Study Conclusions - Left ventricle: The cavity size was normal.  Wall thickness was normal. Systolic function was normal. The estimated ejection fraction was in the range of 60% to 65%. Wall motion was normal; there were no regional wall motion abnormalities. Doppler parameters are consistent with abnormal left ventricular relaxation (grade 1 diastolic dysfunction). - Aortic valve: Trileaflet; mildly thickened leaflets. There was trivial regurgitation. - Mitral valve: There was trivial regurgitation. - Right atrium: Central venous pressure (est): 3 mm Hg. - Atrial septum: No defect or patent foramen ovale was identified. - Tricuspid valve: There was mild regurgitation. - Pulmonary arteries: PA peak pressure: 31 mm Hg (S). - Pericardium, extracardiac: There was no pericardial effusion. Impressions: - Normal LV wall thickness with LVEF 67-67%, grade 1 diastolic dysfunction. Trivial  mitral and aortic regurgitation. Mild tricuspid regurgitation with PASP 31 mmHg.    ASSESSMENT AND PLAN  Kenneth Potter is a 80 y.o. male with a history of mild dementia, HTN, DM, GERD, non-obstructive CAD, bradycardia, prostate cancer and HLD who presented to [facility] on 07/26/14 with n/v, fever and AMS and found to have acute cholecystitis. He will require cholecysectomy and was transferred to [facility] for the procedure. Cardiology was consulted for cardiac clearance.   Pre-Operative Evaluation: Seen and cleared by Dr. Domenic Polite yesterday: "From a cardiac perspective, Mr. Grantham has been stable. He had a heart catheterization done within the last 2 years that showed mild nonobstructive CAD, specifically a 40% RCA stenosis. He has been managed medically otherwise. He does not report any reproducible angina symptoms or heart failure symptoms, with typical ADLs exceeding 4 METs by description." -- Repeat 2D ECHO: 07/29/2014 reassuring with EF 60-65%, no RWMAs. G1DD. Mild TR with  PASP 31 mmHg.  Mild nonobstructive CAD: As stated, most recent cardiac catheterization in 2013 demonstrated 40% RCA stenosis. Continue medical management.   Hypertension: Excellent control of blood pressure on amlodipine. Continue perioperatively.   Mixed Hyperlipidemia: On simvastatin. LFTs were elevated initially related to cholecystitis, would continue statin currently, and his LFTs have normalized doubt related to use of statin.  Bradycardia- He was bradycardic by ECG but not symptomatic and not on any rate lowering medications. -- Currently telemetry reveals sinus with freq PACs and PVCs: HR 60s  Hypokalemia- K 3.3 yesterday. He was given supplementation. No new labs today. Continue to monitor.   Tyrell Antonio PA-C  Pager [phone]  I have examined the patient and reviewed assessment and plan and discussed with patient.  Agree with above as stated.  No further cardiac testing needed before surgery. No cardiac sx  at this time.  Normal EF by echo.  Given his age, would have to be mindful of fluid overload post operatively.  Britanni Yarde S.

## 2014-07-30 NOTE — Anesthesia Preprocedure Evaluation (Addendum)
Anesthesia Evaluation  Patient identified by MRN, date of birth, ID band Patient awake    Reviewed: Allergy & Precautions, H&P , NPO status , Patient's Chart, lab work & pertinent test results  History of Anesthesia Complications Negative for: history of anesthetic complications  Airway Mallampati: II TM Distance: >3 FB Neck ROM: Full    Dental no notable dental hx. (+) Edentulous Lower, Edentulous Upper   Pulmonary neg pulmonary ROS, former smoker,  breath sounds clear to auscultation  Pulmonary exam normal       Cardiovascular hypertension, Pt. on medications + angina + CAD - Past MI and - CHF - dysrhythmias Rhythm:Irregular     Neuro/Psych PSYCHIATRIC DISORDERS Anxiety negative psych ROS   GI/Hepatic Neg liver ROS, GERD-  Medicated,  Endo/Other  diabetes, Type 2, Oral Hypoglycemic Agents  Renal/GU negative Renal ROS  negative genitourinary   Musculoskeletal   Abdominal (+)  Abdomen: soft.    Peds  Hematology negative hematology ROS (+)   Anesthesia Other Findings   Reproductive/Obstetrics negative OB ROS                         Anesthesia Physical Anesthesia Plan  ASA: III  Anesthesia Plan: General   Post-op Pain Management:    Induction: Intravenous  Airway Management Planned: Oral ETT  Additional Equipment: None  Intra-op Plan:   Post-operative Plan: Extubation in OR  Informed Consent: I have reviewed the patients History and Physical, chart, labs and discussed the procedure including the risks, benefits and alternatives for the proposed anesthesia with the patient or authorized representative who has indicated his/her understanding and acceptance.     Plan Discussed with: CRNA and Surgeon  Anesthesia Plan Comments:       Anesthesia Quick Evaluation

## 2014-07-31 DIAGNOSIS — R339 Retention of urine, unspecified: Secondary | ICD-10-CM

## 2014-07-31 LAB — COMPREHENSIVE METABOLIC PANEL WITH GFR
ALT: 50 U/L (ref 0–53)
AST: 55 U/L — ABNORMAL HIGH (ref 0–37)
Albumin: 2.5 g/dL — ABNORMAL LOW (ref 3.5–5.2)
Alkaline Phosphatase: 70 U/L (ref 39–117)
Anion gap: 13 (ref 5–15)
BUN: 16 mg/dL (ref 6–23)
CO2: 18 meq/L — ABNORMAL LOW (ref 19–32)
Calcium: 7.9 mg/dL — ABNORMAL LOW (ref 8.4–10.5)
Chloride: 104 meq/L (ref 96–112)
Creatinine, Ser: 1.14 mg/dL (ref 0.50–1.35)
GFR calc Af Amer: 65 mL/min — ABNORMAL LOW
GFR calc non Af Amer: 56 mL/min — ABNORMAL LOW
Glucose, Bld: 182 mg/dL — ABNORMAL HIGH (ref 70–99)
Potassium: 3.8 meq/L (ref 3.7–5.3)
Sodium: 135 meq/L — ABNORMAL LOW (ref 137–147)
Total Bilirubin: 0.8 mg/dL (ref 0.3–1.2)
Total Protein: 6 g/dL (ref 6.0–8.3)

## 2014-07-31 LAB — CBC
HCT: 34.3 % — ABNORMAL LOW (ref 39.0–52.0)
Hemoglobin: 12.2 g/dL — ABNORMAL LOW (ref 13.0–17.0)
MCH: 32.4 pg (ref 26.0–34.0)
MCHC: 35.6 g/dL (ref 30.0–36.0)
MCV: 91.2 fL (ref 78.0–100.0)
Platelets: 147 10*3/uL — ABNORMAL LOW (ref 150–400)
RBC: 3.76 MIL/uL — ABNORMAL LOW (ref 4.22–5.81)
RDW: 13.3 % (ref 11.5–15.5)
WBC: 8.5 10*3/uL (ref 4.0–10.5)

## 2014-07-31 LAB — GLUCOSE, CAPILLARY
GLUCOSE-CAPILLARY: 106 mg/dL — AB (ref 70–99)
Glucose-Capillary: 148 mg/dL — ABNORMAL HIGH (ref 70–99)
Glucose-Capillary: 136 mg/dL — ABNORMAL HIGH (ref 70–99)
Glucose-Capillary: 147 mg/dL — ABNORMAL HIGH (ref 70–99)

## 2014-07-31 LAB — MAGNESIUM: Magnesium: 1.7 mg/dL (ref 1.5–2.5)

## 2014-07-31 MED ORDER — LIDOCAINE HCL 2 % EX GEL
1.0000 "application " | Freq: Once | CUTANEOUS | Status: DC
  Filled 2014-07-31: qty 5

## 2014-07-31 MED ORDER — LIDOCAINE HCL 2 % EX GEL
1.0000 "application " | Freq: Once | CUTANEOUS | Status: AC
  Administered 2014-07-31: 1 via URETHRAL
  Filled 2014-07-31: qty 20

## 2014-07-31 MED ORDER — PANTOPRAZOLE SODIUM 40 MG PO TBEC
40.0000 mg | DELAYED_RELEASE_TABLET | Freq: Every day | ORAL | Status: DC
  Administered 2014-07-31 – 2014-08-04 (×5): 40 mg via ORAL
  Filled 2014-07-31 (×4): qty 1

## 2014-07-31 MED ORDER — CIPROFLOXACIN IN D5W 400 MG/200ML IV SOLN
400.0000 mg | Freq: Two times a day (BID) | INTRAVENOUS | Status: DC
  Administered 2014-07-31 – 2014-08-02 (×5): 400 mg via INTRAVENOUS
  Filled 2014-07-31 (×5): qty 200

## 2014-07-31 NOTE — Progress Notes (Signed)
Physical Therapy Treatment Patient Details Name: Kenneth Potter MRN: [id number] DOB: 1926-02-06 Today's Date: 07/31/2014    History of Present Illness      PT Comments    Pt continues to be lethargic, suspect to be due to medication.  PT to continue per POC.  Follow Up Recommendations  SNF     Equipment Recommendations  None recommended by PT    Recommendations for Other Services       Precautions / Restrictions Precautions Precautions: Fall    Mobility  Bed Mobility         Supine to sit: Max assist Sit to supine: Max assist   General bed mobility comments: verbal/tactile cues for sequencing;min assist to maintain balance sitting EOB  Transfers                 General transfer comment: Pt up in recliner at initiation of eval.  Nsg reports pt transfered bed to chair with +2 max assist.  Pt unable to clear bottom from chair for sit to stand or stand-pivot transfer.  Clarise Cruz Plus used for return to bed.  Ambulation/Gait                 Stairs            Wheelchair Mobility    Modified Rankin (Stroke Patients Only)       Balance                                    Cognition Arousal/Alertness: Lethargic;Suspect due to medications Behavior During Therapy: Flat affect Overall Cognitive Status: Impaired/Different from baseline Area of Impairment: Awareness;Problem solving     Memory: Decreased short-term memory     Awareness: Intellectual Problem Solving: Slow processing;Difficulty sequencing;Requires verbal cues;Decreased initiation      Exercises      General Comments        Pertinent Vitals/Pain No c/o    Home Living                      Prior Function            PT Goals (current goals can now be found in the care plan section) Progress towards PT goals: Progressing toward goals    Frequency  Min 3X/week    PT Plan Current plan remains appropriate    Co-evaluation             End  of Session   Activity Tolerance: Patient limited by lethargy Patient left: in bed;with call bell/phone within reach;with family/visitor present     Time: 1345-1432 PT Time Calculation (min): 47 min  Charges:  $Therapeutic Activity: 38-52 mins                    G Codes:      Lorriane Shire 07/31/2014, 3:41 PM  Lorrin Goodell, PT  Office # [phone] Pager [phone]

## 2014-07-31 NOTE — Progress Notes (Signed)
Paged Dr. Maryland Pink, pt has only 250 cc's since surgery. Pt states feels like he has to "pee". Dr. Maryland Pink paged back t/o do bladder scan before doing in and out cath.

## 2014-07-31 NOTE — Progress Notes (Signed)
Paged Dr.Krishnan, bladder scan shown 200 cc's pt urinated approx 150 cc's upon bladder scan.  Dr. Maryland Pink consulted Urologist Dr. Dorina Hoyer, urologist called and ordered 16 and 18 Koude cath and urojet. Page charge nurse on 4000 to come up and place [place].  If unable page Dr. Dorina Hoyer [phone].  Sent for [employer] .

## 2014-07-31 NOTE — Progress Notes (Signed)
Unable to do in and out cath, met with resistance x 2.  Pt urinated 150 cc's yellow urine prior to attempting to cath.  Paged Dr. Roxine Caddy.

## 2014-07-31 NOTE — Progress Notes (Signed)
Paged Dr. Roxine Caddy, pt has not voided but 200cc's this shift. FYI

## 2014-07-31 NOTE — Progress Notes (Signed)
Paged Dr. Roxine Caddy pt has 441 ml urine in bladder.

## 2014-07-31 NOTE — Progress Notes (Signed)
ANTIBIOTIC CONSULT NOTE - INITIAL  Pharmacy Consult for Cipro Indication: Intra-abdominal infection  Allergies  Allergen Reactions  . Codeine Nausea Only    Nausea per family  . Aspirin     Can not tolerate in high dosage    Patient Measurements: Height: 5\' 2"  (157.5 cm) Weight: 139 lb 12.4 oz (63.4 kg) IBW/kg (Calculated) : 54.6 Adjusted Body Weight:    Vital Signs: Temp: 98.1 F (36.7 C) (08/01 1000) Temp src: Oral (08/01 1000) BP: 126/77 mmHg (08/01 1000) Pulse Rate: 68 (08/01 1000) Intake/Output from previous day: 07/31 0701 - 08/01 0700 In: 860 [P.O.:60; I.V.:800] Out: 1050 [Urine:1050] Intake/Output from this shift: Total I/O In: 120 [P.O.:120] Out: -   Labs:  Recent Labs  07/29/14 0601 07/31/14 0318  WBC 8.9 8.5  HGB 12.9* 12.2*  PLT 131* 147*  CREATININE 1.14 1.14   Estimated Creatinine Clearance: 35.9 ml/min (by C-G formula based on Cr of 1.14). No results found for this basename: VANCOTROUGH, Corlis Leak, VANCORANDOM, GENTTROUGH, GENTPEAK, GENTRANDOM, TOBRATROUGH, TOBRAPEAK, TOBRARND, AMIKACINPEAK, AMIKACINTROU, AMIKACIN,  in the last 72 hours   Microbiology: Recent Results (from the past 720 hour(s))  URINE CULTURE     Status: None   Collection Time    07/26/14  7:52 PM      Result Value Ref Range Status   Specimen Description URINE, CATHETERIZED   Final   Special Requests NONE   Final   Culture  Setup Time     Final   Value: 07/27/2014 14:28     Performed at [place]     Final   Value: NO GROWTH     Performed at [facility]   Culture     Final   Value: NO GROWTH     Performed at [facility]   Report Status 07/28/2014 FINAL   Final  CULTURE, BLOOD (ROUTINE X 2)     Status: None   Collection Time    07/26/14  8:11 PM      Result Value Ref Range Status   Specimen Description BLOOD LEFT FOREARM   Final   Special Requests BOTTLES DRAWN AEROBIC AND ANAEROBIC [place]   Final   Culture  Setup Time      Final   Value: 07/27/2014 19:14     Performed at [facility]   Culture     Final   Value: ESCHERICHIA COLI     Note: SUSCEPTIBILITIES PERFORMED ON PREVIOUS CULTURE WITHIN THE LAST 5 DAYS.     Note: Gram Stain Report Called to,Read Back By and Verified With: DICKERSONM 1110 07/27/14 BY Elza Rafter Performed at [facility]     Performed at [facility]   Report Status 07/29/2014 FINAL   Final  CULTURE, BLOOD (ROUTINE X 2)     Status: None   Collection Time    07/26/14  8:11 PM      Result Value Ref Range Status   Specimen Description BLOOD LEFT ANTECUBITAL   Final   Special Requests     Final   Value: BOTTLES DRAWN AEROBIC AND ANAEROBIC AEB=6CC ANA=4CC   Culture  Setup Time     Final   Value: 07/27/2014 18:58     Performed at [facility]   Culture     Final   Value: ESCHERICHIA COLI     Note: Gram Stain Report Called to,Read Back By and Verified With: Cory Roughen 1110 07/27/14 BY Elza Rafter Performed  at [facility]     Performed at [facility]   Report Status 07/29/2014 FINAL   Final   Organism ID, Bacteria ESCHERICHIA COLI   Final  CULTURE, BLOOD (ROUTINE X 2)     Status: None   Collection Time    07/28/14  2:41 AM      Result Value Ref Range Status   Specimen Description BLOOD LEFT FOREARM   Final   Special Requests BOTTLES DRAWN AEROBIC AND ANAEROBIC 6CC EACH   Final   Culture NO GROWTH 3 DAYS   Final   Report Status PENDING   Incomplete  CULTURE, BLOOD (ROUTINE X 2)     Status: None   Collection Time    07/28/14  2:41 AM      Result Value Ref Range Status   Specimen Description BLOOD LEFT HAND   Final   Special Requests     Final   Value: BOTTLES DRAWN AEROBIC AND ANAEROBIC AEB=6CC ANA=8CC   Culture NO GROWTH 3 DAYS   Final   Report Status PENDING   Incomplete    Medical History: Past Medical History  Diagnosis Date  . Essential hypertension, benign   . Mixed hyperlipidemia   . Near syncope   . CAD (coronary artery  disease)     Nonobstructive by cath 06/2012  . Bradycardia   . Diabetes mellitus without complication   . Prostate cancer   . Anxiety   . GERD (gastroesophageal reflux disease)   . Seizures   . History of echocardiogram     a. 2D ECHO: 07/29/2014  EF 60-65%, no RWMAs. G1DD. Mild TR with  PASP 31 mmHg.   Assessment: Admit Complaint: 80 yo male admitted with N/V and AMS, febrile. Possible UTI vs. Cholangitis due to abnormal LFT. Planning for cholecystectomy.  Anticoagulation: SCDs  Infectious Disease: Ecoli bacteremia from biliary process.  Cipro 8/1>> Zosyn 7/28 >>8/1  7/29: BC x 2: pending 7/27 - Blood cultures with Ecoli - pan-sensitive, except R-ampicillin 7/27 UCx > negative  Cardiovascular: Hx HTN, HLD, CAD, EF 60-65% 07/29/14. Meds: amlodipine10, simva. VSS this AM.  Endocrinology: Hx DM, CBG's 81-172 on SSI  Gastrointestinal / Nutrition: GERD on PPI. +diet  Neurology: anxiety, h/o seizures noted, mild dementia, acute encephalitis closer to baseline  Nephrology: Scr 1.14, CrCl ~ 35 ml/min  Pulmonary: RA  Hematology / Oncology: h/o prostate cancer. CBC/Pltc ok  PTA Medication Issues: not on PTA lisinopril or sitagliptin  Best Practices: Protonix, SCDs   Goal of Therapy:  Treatment of Ecoli bacteremia  Plan:  Cipro 400mg  IV q12hrs.   Rayanne Padmanabhan S. Alford Highland, PharmD, [facility] Clinical Staff Pharmacist Pager [phone]  Eilene Ghazi Stillinger 07/31/2014,10:30 AM

## 2014-07-31 NOTE — Progress Notes (Signed)
18Fr Coude cath inserted on first attempt without difficulty.  Lidocaine jelly used for comfort as ordered.  Dark Kenneth Potter colored urine draining without complications.  Patient tolerated procedure without complaint.    Fredna Dow M

## 2014-07-31 NOTE — Progress Notes (Signed)
Dr. Maryland Pink returned page.  Hold off on in and out.  Encourage fluids, up to chair.  Monitor urine output.

## 2014-07-31 NOTE — Progress Notes (Signed)
Incentive spirometer in use 275 at this time.

## 2014-07-31 NOTE — Progress Notes (Signed)
[facility] RN received report from day shift RN that patient needed Coude Catheter placed (16 french or 18 french) with Urojet.  Charge Nurse from [address] called to place catheter.  She came as soon as she was available.  She was unsure what Urojet was.  This RN called [facility] RN for clarification.  Upon conversation, I entered order for viscous lidocaine and called pharmacy to expedite.  [facility] [place] RN had to return to her unit and asked that we call her when lidocaine received.  The Coude Catheters were already on the unit.  Upon receiving the lidocaine from pharmacy, [place] called the [facility] RN to come put the Coude in.  The [facility] [place] RN came when she became available and was able to put the Coude Catheter in without difficulty by 2146.  Urologist made aware.  Earleen Reaper RN-BC, [employer]

## 2014-07-31 NOTE — Progress Notes (Addendum)
1 Day Post-Op  Subjective: No complaints.  Denies n/v.  Pain tolerable.    Objective: Vital signs in last 24 hours: Temp:  [97 F (36.1 C)-100 F (37.8 C)] 98.6 F (37 C) (08/01 0622) Pulse Rate:  [72-112] 86 (08/01 0622) Resp:  [14-20] 18 (08/01 0622) BP: (106-153)/(52-74) 132/74 mmHg (08/01 0622) SpO2:  [93 %-99 %] 98 % (08/01 0622) Last BM Date: 07/28/14  Intake/Output from previous day: 07/31 0701 - 08/01 0700 In: 860 [P.O.:60; I.V.:800] Out: 1050 [Urine:1050] Intake/Output this shift:    General appearance: cooperative, no distress and sl sleepy, but awakens and answers questions appropriately Resp: breathing comfortably GI: soft, non tender, non distended, dressings c/d/i.  Lab Results:   Recent Labs  07/29/14 0601 07/31/14 0318  WBC 8.9 8.5  HGB 12.9* 12.2*  HCT 35.5* 34.3*  PLT 131* 147*   BMET  Recent Labs  07/29/14 0601 07/31/14 0318  NA 139 135*  K 3.3* 3.8  CL 106 104  CO2 23 18*  GLUCOSE 104* 182*  BUN 14 16  CREATININE 1.14 1.14  CALCIUM 8.5 7.9*   PT/INR No results found for this basename: LABPROT, INR,  in the last 72 hours ABG No results found for this basename: PHART, PCO2, PO2, HCO3,  in the last 72 hours  Studies/Results: Dg Cholangiogram Operative  07/30/2014   CLINICAL DATA:  Diabetes  EXAM: INTRAOPERATIVE CHOLANGIOGRAM  TECHNIQUE: Cholangiographic images from the C-arm fluoroscopic device were submitted for interpretation post-operatively. Please see the procedural report for the amount of contrast and the fluoroscopy time utilized.  COMPARISON:  None.  FINDINGS: Contrast fills the biliary tree and duodenum without filling defects in the common bile duct  IMPRESSION: Patent biliary tree without evidence of common bile duct stone.   Electronically Signed   By: Maryclare Bean M.D.   On: 07/30/2014 14:56    Anti-infectives: Anti-infectives   Start     Dose/Rate Route Frequency Ordered Stop   07/27/14 1400  piperacillin-tazobactam  (ZOSYN) IVPB 3.375 g     3.375 g 12.5 mL/hr over 240 Minutes Intravenous Every 8 hours 07/27/14 0741     07/27/14 0200  piperacillin-tazobactam (ZOSYN) IVPB 3.375 g     3.375 g 12.5 mL/hr over 240 Minutes Intravenous  Once 07/26/14 2335 07/27/14 0906   07/26/14 2345  piperacillin-tazobactam (ZOSYN) IVPB 3.375 g  Status:  Discontinued     3.375 g 12.5 mL/hr over 240 Minutes Intravenous  Once 07/26/14 2332 07/26/14 2334   07/26/14 1930  vancomycin (VANCOCIN) IVPB 1000 mg/200 mL premix     1,000 mg 200 mL/hr over 60 Minutes Intravenous  Once 07/26/14 1929 07/26/14 2140   07/26/14 1930  piperacillin-tazobactam (ZOSYN) IVPB 3.375 g     3.375 g 100 mL/hr over 30 Minutes Intravenous  Once 07/26/14 12-21-1926 07/26/14 2040      Assessment/Plan: s/p Procedure(s): LAPAROSCOPIC CHOLECYSTECTOMY WITH INTRAOPERATIVE CHOLANGIOGRAM (N/A) Advance diet no evidence of surgical complications at this point.   Home per primary team.  Stable for d/c from abdominal standpoint.    LOS: 5 days    [facility] 07/31/2014

## 2014-07-31 NOTE — Progress Notes (Signed)
[facility] PROGRESS NOTE  Kenneth Potter:[id number] DOB: Dec 10, 1926 DOA: 07/26/2014  PCP: [name], MD  Brief HPI: 80yo male presented with nausea/vomiting and altered mental status. He was noted to have a fever in the ED. Possible UTI versus cholangitis due to abnormal LFT. It was felt his presentation was mostly due to biliary issues, Patient underwent cholecystectomy.  Past medical history:  Past Medical History  Diagnosis Date  . Essential hypertension, benign   . Mixed hyperlipidemia   . Near syncope   . CAD (coronary artery disease)     Nonobstructive by cath 06/2012  . Bradycardia   . Diabetes mellitus without complication   . Prostate cancer   . Anxiety   . GERD (gastroesophageal reflux disease)   . Seizures   . History of echocardiogram     a. 2D ECHO: 07/29/2014  EF 60-65%, no RWMAs. G1DD. Mild TR with  PASP 31 mmHg.    Consultants: GI, General Surgery, Cardiology  Procedures:  2D ECHO:  Impressions:- Normal LV wall thickness with LVEF 15-40%, grade 1 diastolic dysfunction. Trivial mitral and aortic regurgitation. Mild tricuspid regurgitation with PASP 31 mmHg.  Lap Chole 7/31  Antibiotics: Zosyn 7/27-->8/1 Cipro 8/1-->  Subjective: Patient feels well. Complains of difficulty with urination. Denies nausea or vomiting. Daughter is at bedside.  Objective: Vital Signs  Filed Vitals:   07/30/14 1900 07/30/14 2156 07/31/14 0622 07/31/14 1000  BP: 134/73 114/64 132/74 126/77  Pulse: 87 84 86 68  Temp: 97 F (36.1 C) 100 F (37.8 C) 98.6 F (37 C) 98.1 F (36.7 C)  TempSrc: Oral Oral  Oral  Resp: 20 18 18 18   Height:      Weight:      SpO2: 99% 98% 98% 100%    Intake/Output Summary (Last 24 hours) at 07/31/14 1021 Last data filed at 07/31/14 0900  Gross per 24 hour  Intake    980 ml  Output    450 ml  Net    530 ml   Filed Weights   07/26/14 2327 07/28/14 0708 07/29/14 1934  Weight: 62.2 kg (137 lb 2 oz) 61.1 kg (134 lb 11.2  oz) 63.4 kg (139 lb 12.4 oz)    General appearance: Awake and alert Resp: clear to auscultation bilaterally Cardio: regular rate and rhythm, S1, S2 normal, no murmur, click, rub or gallop GI: soft, mild tenderness in suprapubic area. Distended urinary bladder. bowel sounds normal. Extremities: No edema Pulses: 2+ and symmetric Neurologic: Awake and alert. No focal deficits.   Lab Results:  Basic Metabolic Panel:  Recent Labs Lab 07/26/14 1929 07/27/14 0516 07/28/14 0241 07/29/14 0601 07/31/14 0318  NA 139 141 139 139 135*  K 3.6* 3.4* 3.6* 3.3* 3.8  CL 101 105 103 106 104  CO2 21 23 18* 23 18*  GLUCOSE 169* 113* 150* 104* 182*  BUN 19 16 19 14 16   CREATININE 1.30 1.18 1.38* 1.14 1.14  CALCIUM 9.1 8.9 8.7 8.5 7.9*  MG  --   --   --   --  1.7   Liver Function Tests:  Recent Labs Lab 07/26/14 2130 07/27/14 0516 07/28/14 0241 07/29/14 0601 07/31/14 0318  AST 180* 114* 64* 45* 55*  ALT 170* 131* 91* 56* 50  ALKPHOS 132* 132* 104 77 70  BILITOT 2.1* 2.1* 2.0* 1.0 0.8  PROT 7.2 6.8 6.9 6.3 6.0  ALBUMIN 3.5 3.3* 3.1* 2.6* 2.5*    Recent Labs Lab 07/26/14 2130  LIPASE 9*  CBC:  Recent Labs Lab 07/26/14 1929 07/27/14 0516 07/28/14 0241 07/29/14 0601 07/31/14 0318  WBC 11.8* 10.9* 8.1 8.9 8.5  NEUTROABS 10.3*  --   --   --   --   HGB 14.3 14.0 14.3 12.9* 12.2*  HCT 39.5 39.4 38.9* 35.5* 34.3*  MCV 92.5 93.1 91.5 91.5 91.2  PLT 162 159 132* 131* 147*   CBG:  Recent Labs Lab 07/30/14 1224 07/30/14 1510 07/30/14 1651 07/30/14 2153 07/31/14 0809  GLUCAP 81 120* 107* 172* 148*    Recent Results (from the past 240 hour(s))  URINE CULTURE     Status: None   Collection Time    07/26/14  7:52 PM      Result Value Ref Range Status   Specimen Description URINE, CATHETERIZED   Final   Special Requests NONE   Final   Culture  Setup Time     Final   Value: 07/27/2014 14:28     Performed at [place]     Final   Value: NO  GROWTH     Performed at [facility]   Culture     Final   Value: NO GROWTH     Performed at [facility]   Report Status 07/28/2014 FINAL   Final  CULTURE, BLOOD (ROUTINE X 2)     Status: None   Collection Time    07/26/14  8:11 PM      Result Value Ref Range Status   Specimen Description BLOOD LEFT FOREARM   Final   Special Requests BOTTLES DRAWN AEROBIC AND ANAEROBIC [facility] EACH   Final   Culture  Setup Time     Final   Value: 07/27/2014 19:14     Performed at [facility]   Culture     Final   Value: ESCHERICHIA COLI     Note: SUSCEPTIBILITIES PERFORMED ON PREVIOUS CULTURE WITHIN THE LAST 5 DAYS.     Note: Gram Stain Report Called to,Read Back By and Verified With: DICKERSONM 1110 07/27/14 BY Elza Rafter Performed at [facility]     Performed at [facility]   Report Status 07/29/2014 FINAL   Final  CULTURE, BLOOD (ROUTINE X 2)     Status: None   Collection Time    07/26/14  8:11 PM      Result Value Ref Range Status   Specimen Description BLOOD LEFT ANTECUBITAL   Final   Special Requests     Final   Value: BOTTLES DRAWN AEROBIC AND ANAEROBIC AEB=6CC ANA=4CC   Culture  Setup Time     Final   Value: 07/27/2014 18:58     Performed at [facility]   Culture     Final   Value: ESCHERICHIA COLI     Note: Gram Stain Report Called to,Read Back By and Verified With: Cory Roughen 1110 07/27/14 BY Elza Rafter Performed at [facility]     Performed at [facility]   Report Status 07/29/2014 FINAL   Final   Organism ID, Bacteria ESCHERICHIA COLI   Final  CULTURE, BLOOD (ROUTINE X 2)     Status: None   Collection Time    07/28/14  2:41 AM      Result Value Ref Range Status   Specimen Description BLOOD LEFT FOREARM   Final   Special Requests BOTTLES DRAWN AEROBIC AND ANAEROBIC [place]   Final   Culture NO GROWTH 3 DAYS  Final   Report Status PENDING   Incomplete  CULTURE, BLOOD (ROUTINE X 2)     Status: None   Collection Time     07/28/14  2:41 AM      Result Value Ref Range Status   Specimen Description BLOOD LEFT HAND   Final   Special Requests     Final   Value: BOTTLES DRAWN AEROBIC AND ANAEROBIC AEB=6CC ANA=8CC   Culture NO GROWTH 3 DAYS   Final   Report Status PENDING   Incomplete      Studies/Results: Dg Cholangiogram Operative  07/30/2014   CLINICAL DATA:  Diabetes  EXAM: INTRAOPERATIVE CHOLANGIOGRAM  TECHNIQUE: Cholangiographic images from the C-arm fluoroscopic device were submitted for interpretation post-operatively. Please see the procedural report for the amount of contrast and the fluoroscopy time utilized.  COMPARISON:  None.  FINDINGS: Contrast fills the biliary tree and duodenum without filling defects in the common bile duct  IMPRESSION: Patent biliary tree without evidence of common bile duct stone.   Electronically Signed   By: Maryclare Bean M.D.   On: 07/30/2014 14:56    Medications:  Scheduled: . amLODipine  10 mg Oral Daily  . insulin aspart  0-15 Units Subcutaneous TID WC  . insulin aspart  0-5 Units Subcutaneous QHS  . pantoprazole sodium  40 mg Per Tube Daily  . simvastatin  20 mg Oral QHS   Continuous:   YKD:[id number], acetaminophen, LORazepam, nitroGLYCERIN, ondansetron (ZOFRAN) IV, ondansetron, oxyCODONE-acetaminophen, traMADol  Assessment/Plan:  Active Problems:   Essential hypertension, benign   Diabetes mellitus   Encephalopathy acute   Abnormal LFTs   Altered mental status   Bacteremia   Preoperative cardiovascular examination   Acute cholecystitis    E coli Bacteremia  From biliary process: acute cholangitis versus acute cholecystitis. Urine culture was negative. Change to Cipro based on sensitivities.  Acute cholangitis versus acute cholecystitis  Patient had fever, transaminitis, mild jaundice and RUQ pain, per ultrasound no CBD stones. HIDA report as above. GI was consulted. And then General surgery was consulted as well. Cardiology consulted for  preoperative evaluation and they deemed him low to intermediate risk. ECHO report was reviewed. Normal EF without wall motion abnormalities. Patient has non obstructive CAD based on cath from July 2013. Patient was transferred to [facility] due to inconsistent surgical coverage at [facility] for next 10 days. He was seen by Dr. Hulen Skains and underwent surgery on 7/31. Seems to be stable. Will change to Cipro.   Possible UTI  Urine culture was negative. UA was abnormal.  Transaminitis  As above. Improved.  Acute encephalitis  Seems to be close to baseline. Confusion was secondary to acute illness, bacteremia.   Urinary Retention In and out cath. Mobilize.  History of HTN Stable. Continue to monitor  History of DM2 On SSI. Monitor CBG. Holding oral agents as oral intake is poor.  Code Status: Full Code  DVT Prophylaxis: Heparin    Family Communication: Discussed with patient and daughters.  Disposition Plan: Lives at home with family. Family had refused SNF. Continue PT/OT. Will be important to reassess post operatively. Anticipate discharge early next week.    LOS: 5 days   [facility] [facility] Pager [phone] 07/31/2014, 10:21 AM  If 8PM-8AM, please contact night-coverage at [email or web address], password [email or web address]   Disclaimer: This note was dictated with voice recognition software. Similar sounding words can inadvertently be transcribed and may not be corrected upon review.

## 2014-07-31 NOTE — Progress Notes (Signed)
Pt's family states his mouth looks drawn.  BP 166/69 hr 87 , grips equal, alert and oriented. Paged Dr. Maryland Pink, t/o do bladder scan and please advise.

## 2014-08-01 LAB — COMPREHENSIVE METABOLIC PANEL
ALT: 44 U/L (ref 0–53)
AST: 42 U/L — AB (ref 0–37)
Albumin: 2.4 g/dL — ABNORMAL LOW (ref 3.5–5.2)
Alkaline Phosphatase: 66 U/L (ref 39–117)
Anion gap: 12 (ref 5–15)
BUN: 12 mg/dL (ref 6–23)
CALCIUM: 8.1 mg/dL — AB (ref 8.4–10.5)
CO2: 22 meq/L (ref 19–32)
CREATININE: 1.06 mg/dL (ref 0.50–1.35)
Chloride: 102 mEq/L (ref 96–112)
GFR calc Af Amer: 71 mL/min — ABNORMAL LOW (ref >90)
GFR, EST NON AFRICAN AMERICAN: 61 mL/min — AB (ref >90)
Glucose, Bld: 132 mg/dL — ABNORMAL HIGH (ref 70–99)
Potassium: 3.4 mEq/L — ABNORMAL LOW (ref 3.7–5.3)
Sodium: 136 mEq/L — ABNORMAL LOW (ref 137–147)
Total Bilirubin: 0.7 mg/dL (ref 0.3–1.2)
Total Protein: 5.9 g/dL — ABNORMAL LOW (ref 6.0–8.3)

## 2014-08-01 LAB — GLUCOSE, CAPILLARY
GLUCOSE-CAPILLARY: 140 mg/dL — AB (ref 70–99)
Glucose-Capillary: 125 mg/dL — ABNORMAL HIGH (ref 70–99)
Glucose-Capillary: 104 mg/dL — ABNORMAL HIGH (ref 70–99)
Glucose-Capillary: 129 mg/dL — ABNORMAL HIGH (ref 70–99)

## 2014-08-01 LAB — CBC
HCT: 33.5 % — ABNORMAL LOW (ref 39.0–52.0)
Hemoglobin: 11.9 g/dL — ABNORMAL LOW (ref 13.0–17.0)
MCH: 32.5 pg (ref 26.0–34.0)
MCHC: 35.5 g/dL (ref 30.0–36.0)
MCV: 91.5 fL (ref 78.0–100.0)
PLATELETS: 167 10*3/uL (ref 150–400)
RBC: 3.66 MIL/uL — AB (ref 4.22–5.81)
RDW: 13.6 % (ref 11.5–15.5)
WBC: 8.3 10*3/uL (ref 4.0–10.5)

## 2014-08-01 MED ORDER — POTASSIUM CHLORIDE 10 MEQ/100ML IV SOLN
10.0000 meq | INTRAVENOUS | Status: AC
  Administered 2014-08-01 (×4): 10 meq via INTRAVENOUS
  Filled 2014-08-01 (×4): qty 100

## 2014-08-01 MED ORDER — POLYETHYLENE GLYCOL 3350 17 G PO PACK
17.0000 g | PACK | Freq: Every day | ORAL | Status: DC
  Administered 2014-08-01 – 2014-08-02 (×2): 17 g via ORAL
  Filled 2014-08-01 (×4): qty 1

## 2014-08-01 MED ORDER — BISACODYL 10 MG RE SUPP
10.0000 mg | Freq: Every day | RECTAL | Status: DC
  Administered 2014-08-01 – 2014-08-03 (×3): 10 mg via RECTAL
  Filled 2014-08-01 (×3): qty 1

## 2014-08-01 MED ORDER — SENNOSIDES-DOCUSATE SODIUM 8.6-50 MG PO TABS
1.0000 | ORAL_TABLET | Freq: Two times a day (BID) | ORAL | Status: DC
  Administered 2014-08-01 – 2014-08-03 (×4): 1 via ORAL
  Filled 2014-08-01 (×7): qty 1

## 2014-08-01 NOTE — Progress Notes (Signed)
Paged Dr. Maryland Pink, pt having difficulty swallowing, holding food in mouth.  No problems with liquids.  Paged FYI.

## 2014-08-01 NOTE — Progress Notes (Signed)
Paged Dr. Maryland Pink family would like to speak with him.

## 2014-08-01 NOTE — Progress Notes (Signed)
Paged Dr. Merrilyn Puma pt came up from ED without IV access.  Asked if we could leave IV out pt under suicide precautions.  Sitter In room.  Per Dr. Merrilyn Puma OK to leave IV out for now.

## 2014-08-01 NOTE — Progress Notes (Signed)
[facility] PROGRESS NOTE  Kenneth Potter VEL:[id number] DOB: Mar 15, 1926 DOA: 07/26/2014  PCP: [name], MD  Brief HPI: 80yo male presented with nausea/vomiting and altered mental status. He was noted to have a fever in the ED. Possible UTI versus cholangitis due to abnormal LFT. It was felt his presentation was mostly due to biliary issues, Patient underwent cholecystectomy.  Past medical history:  Past Medical History  Diagnosis Date  . Essential hypertension, benign   . Mixed hyperlipidemia   . Near syncope   . CAD (coronary artery disease)     Nonobstructive by cath 06/2012  . Bradycardia   . Diabetes mellitus without complication   . Prostate cancer   . Anxiety   . GERD (gastroesophageal reflux disease)   . Seizures   . History of echocardiogram     a. 2D ECHO: 07/29/2014  EF 60-65%, no RWMAs. G1DD. Mild TR with  PASP 31 mmHg.    Consultants: GI, General Surgery, Cardiology  Procedures:  2D ECHO:  Impressions:- Normal LV wall thickness with LVEF 25-85%, grade 1 diastolic dysfunction. Trivial mitral and aortic regurgitation. Mild tricuspid regurgitation with PASP 31 mmHg.  Lap Chole 7/31  Antibiotics: Zosyn 7/27-->8/1 Cipro 8/1-->  Subjective: Patient feels better today. Had issues with urination yesterday. A coude catheter had to be placed. Reports better appetite today.   Objective: Vital Signs  Filed Vitals:   07/31/14 1700 07/31/14 2053 08/01/14 0422 08/01/14 0957  BP: 166/69 146/72 144/64 149/69  Pulse: 87 77 79 78  Temp: 98.8 F (37.1 C) 98.7 F (37.1 C) 99.5 F (37.5 C) 98.6 F (37 C)  TempSrc: Oral  Oral Oral  Resp: 18 17 17 18   Height:      Weight:      SpO2: 100% 97% 98% 99%    Intake/Output Summary (Last 24 hours) at 08/01/14 1212 Last data filed at 08/01/14 0900  Gross per 24 hour  Intake    535 ml  Output   1100 ml  Net   -565 ml   Filed Weights   07/26/14 2327 07/28/14 0708 07/29/14 1934  Weight: 62.2 kg (137 lb 2  oz) 61.1 kg (134 lb 11.2 oz) 63.4 kg (139 lb 12.4 oz)    General appearance: Awake and alert Resp: clear to auscultation bilaterally Cardio: regular rate and rhythm, S1, S2 normal, no murmur, click, rub or gallop GI: soft, mild tenderness diffusely. No rebound rigidity or guarding. Good BS. No masses.  Extremities: No edema Pulses: 2+ and symmetric Neurologic: Awake and alert. No focal deficits.   Lab Results:  Basic Metabolic Panel:  Recent Labs Lab 07/27/14 0516 07/28/14 0241 07/29/14 0601 07/31/14 0318 08/01/14 0520  NA 141 139 139 135* 136*  K 3.4* 3.6* 3.3* 3.8 3.4*  CL 105 103 106 104 102  CO2 23 18* 23 18* 22  GLUCOSE 113* 150* 104* 182* 132*  BUN 16 19 14 16 12   CREATININE 1.18 1.38* 1.14 1.14 1.06  CALCIUM 8.9 8.7 8.5 7.9* 8.1*  MG  --   --   --  1.7  --    Liver Function Tests:  Recent Labs Lab 07/27/14 0516 07/28/14 0241 07/29/14 0601 07/31/14 0318 08/01/14 0520  AST 114* 64* 45* 55* 42*  ALT 131* 91* 56* 50 44  ALKPHOS 132* 104 77 70 66  BILITOT 2.1* 2.0* 1.0 0.8 0.7  PROT 6.8 6.9 6.3 6.0 5.9*  ALBUMIN 3.3* 3.1* 2.6* 2.5* 2.4*    Recent Labs Lab 07/26/14  2130  LIPASE 9*   CBC:  Recent Labs Lab 07/26/14 1929 07/27/14 0516 07/28/14 0241 07/29/14 0601 07/31/14 0318 08/01/14 0520  WBC 11.8* 10.9* 8.1 8.9 8.5 8.3  NEUTROABS 10.3*  --   --   --   --   --   HGB 14.3 14.0 14.3 12.9* 12.2* 11.9*  HCT 39.5 39.4 38.9* 35.5* 34.3* 33.5*  MCV 92.5 93.1 91.5 91.5 91.2 91.5  PLT 162 159 132* 131* 147* 167   CBG:  Recent Labs Lab 07/31/14 1203 07/31/14 1714 07/31/14 2046 08/01/14 0751 08/01/14 1201  GLUCAP 136* 147* 106* 125* 104*    Recent Results (from the past 240 hour(s))  URINE CULTURE     Status: None   Collection Time    07/26/14  7:52 PM      Result Value Ref Range Status   Specimen Description URINE, CATHETERIZED   Final   Special Requests NONE   Final   Culture  Setup Time     Final   Value: 07/27/2014 14:28      Performed at [place]     Final   Value: NO GROWTH     Performed at [facility]   Culture     Final   Value: NO GROWTH     Performed at [facility]   Report Status 07/28/2014 FINAL   Final  CULTURE, BLOOD (ROUTINE X 2)     Status: None   Collection Time    07/26/14  8:11 PM      Result Value Ref Range Status   Specimen Description BLOOD LEFT FOREARM   Final   Special Requests BOTTLES DRAWN AEROBIC AND ANAEROBIC [facility] EACH   Final   Culture  Setup Time     Final   Value: 07/27/2014 19:14     Performed at [facility]   Culture     Final   Value: ESCHERICHIA COLI     Note: SUSCEPTIBILITIES PERFORMED ON PREVIOUS CULTURE WITHIN THE LAST 5 DAYS.     Note: Gram Stain Report Called to,Read Back By and Verified With: DICKERSONM 1110 07/27/14 BY Elza Rafter Performed at [facility]     Performed at [facility]   Report Status 07/29/2014 FINAL   Final  CULTURE, BLOOD (ROUTINE X 2)     Status: None   Collection Time    07/26/14  8:11 PM      Result Value Ref Range Status   Specimen Description BLOOD LEFT ANTECUBITAL   Final   Special Requests     Final   Value: BOTTLES DRAWN AEROBIC AND ANAEROBIC AEB=6CC ANA=4CC   Culture  Setup Time     Final   Value: 07/27/2014 18:58     Performed at [facility]   Culture     Final   Value: ESCHERICHIA COLI     Note: Gram Stain Report Called to,Read Back By and Verified With: Cory Roughen 1110 07/27/14 BY Elza Rafter Performed at [facility]     Performed at [facility]   Report Status 07/29/2014 FINAL   Final   Organism ID, Bacteria ESCHERICHIA COLI   Final  CULTURE, BLOOD (ROUTINE X 2)     Status: None   Collection Time    07/28/14  2:41 AM      Result Value Ref Range Status   Specimen Description BLOOD LEFT FOREARM   Final   Special Requests BOTTLES DRAWN  AEROBIC AND ANAEROBIC 6CC EACH   Final   Culture NO GROWTH 4 DAYS   Final   Report Status PENDING    Incomplete  CULTURE, BLOOD (ROUTINE X 2)     Status: None   Collection Time    07/28/14  2:41 AM      Result Value Ref Range Status   Specimen Description BLOOD LEFT HAND   Final   Special Requests     Final   Value: BOTTLES DRAWN AEROBIC AND ANAEROBIC AEB=6CC ANA=8CC   Culture NO GROWTH 4 DAYS   Final   Report Status PENDING   Incomplete      Studies/Results: Dg Cholangiogram Operative  07/30/2014   CLINICAL DATA:  Diabetes  EXAM: INTRAOPERATIVE CHOLANGIOGRAM  TECHNIQUE: Cholangiographic images from the C-arm fluoroscopic device were submitted for interpretation post-operatively. Please see the procedural report for the amount of contrast and the fluoroscopy time utilized.  COMPARISON:  None.  FINDINGS: Contrast fills the biliary tree and duodenum without filling defects in the common bile duct  IMPRESSION: Patent biliary tree without evidence of common bile duct stone.   Electronically Signed   By: Maryclare Bean M.D.   On: 07/30/2014 14:56    Medications:  Scheduled: . amLODipine  10 mg Oral Daily  . bisacodyl  10 mg Rectal Daily  . ciprofloxacin  400 mg Intravenous Q12H  . insulin aspart  0-15 Units Subcutaneous TID WC  . insulin aspart  0-5 Units Subcutaneous QHS  . pantoprazole  40 mg Oral Q1200  . polyethylene glycol  17 g Oral Daily  . potassium chloride  10 mEq Intravenous Q1 Hr x 4  . senna-docusate  1 tablet Oral BID  . simvastatin  20 mg Oral QHS   Continuous:   VOZ:[id number], acetaminophen, LORazepam, nitroGLYCERIN, ondansetron (ZOFRAN) IV, ondansetron, oxyCODONE-acetaminophen, traMADol  Assessment/Plan:  Active Problems:   Essential hypertension, benign   Diabetes mellitus   Encephalopathy acute   Abnormal LFTs   Altered mental status   Bacteremia   Preoperative cardiovascular examination   Acute cholecystitis    E coli Bacteremia  From biliary process: acute cholangitis versus acute cholecystitis. Now s/p Lap chole. Urine culture was negative. Change to  Cipro based on sensitivities.  Acute cholangitis versus acute cholecystitis  Patient had fever, transaminitis, mild jaundice and RUQ pain, per ultrasound no CBD stones. HIDA report as above. GI was consulted. And then General surgery was consulted as well. Cardiology consulted for preoperative evaluation and they deemed him low to intermediate risk. ECHO report was reviewed. Normal EF without wall motion abnormalities. Patient has non obstructive CAD based on cath from July 2013. Patient was transferred to [facility] due to inconsistent surgical coverage at [facility] for next 10 days. He was seen by Dr. Hulen Skains and underwent surgery on 7/31. Seems to be stable. Now on Cipro. Change to oral when taking orally consistently. Start laxatives as well. Followed by surgery.  Urinary Retention Lot of difficulty with urinating yesterday. Ultimately a coude catheter had to be placed. He will need to be mobilized. Will do voiding trial tomorrow. Has history of prostate cancer.  Possible UTI  Urine culture was negative. UA was abnormal.  Transaminitis  As above. Improved.  Acute encephalitis  Seems to be close to baseline. Confusion was secondary to acute illness, bacteremia.   History of HTN Stable. Continue to monitor  History of DM2 On SSI. Monitor CBG. Holding oral agents as oral intake is poor.  Code Status: Full Code  DVT  Prophylaxis: Heparin    Family Communication: Discussed with patient. No family at bedside today. Disposition Plan: Lives at home with family. PT continues to recommend SNF which family has refused in past. Will re-approach. Continue PT/OT. Anticipate discharge in next 2-3 days.    LOS: 6 days   [facility] Pager [phone] 08/01/2014, 12:12 PM  If 8PM-8AM, please contact night-coverage at [email or web address], password [email or web address]   Disclaimer: This note was dictated with voice recognition software. Similar sounding words can inadvertently be transcribed and may not be  corrected upon review.

## 2014-08-01 NOTE — Progress Notes (Signed)
CCMD notified RN regarding 5 runs of ventricular tachycardia nonsustained.  Immediately returned to NSR.  Patient resting comfortably.  Notified NP on call.  Will continue to monitor.

## 2014-08-01 NOTE — Progress Notes (Signed)
Patient ID: Kenneth Potter, male   DOB: September 22, 1926, 80 y.o.   MRN: [id number] 2 Days Post-Op  Subjective: No complaints. Pt did not eat much yesterday.  Objective: Vital signs in last 24 hours: Temp:  [98.6 F (37 C)-99.5 F (37.5 C)] 98.6 F (37 C) (08/02 0957) Pulse Rate:  [77-87] 78 (08/02 0957) Resp:  [17-18] 18 (08/02 0957) BP: (144-166)/(64-72) 149/69 mmHg (08/02 0957) SpO2:  [97 %-100 %] 99 % (08/02 0957) Last BM Date: 07/28/14  Intake/Output from previous day: 08/01 0701 - 08/02 0700 In: 640 [P.O.:240; IV Piggyback:400] Out: 800 [Urine:800] Intake/Output this shift: Total I/O In: 15 [P.O.:15] Out: 300 [Urine:300]  General appearance: cooperative, no distress and sl sleepy, but awakens and answers questions appropriately Resp: breathing comfortably GI: soft, non tender, sl distended, dressings c/d/i.  Lab Results:   Recent Labs  07/31/14 0318 08/01/14 0520  WBC 8.5 8.3  HGB 12.2* 11.9*  HCT 34.3* 33.5*  PLT 147* 167   BMET  Recent Labs  07/31/14 0318 08/01/14 0520  NA 135* 136*  K 3.8 3.4*  CL 104 102  CO2 18* 22  GLUCOSE 182* 132*  BUN 16 12  CREATININE 1.14 1.06  CALCIUM 7.9* 8.1*   PT/INR No results found for this basename: LABPROT, INR,  in the last 72 hours ABG No results found for this basename: PHART, PCO2, PO2, HCO3,  in the last 72 hours  Studies/Results: Dg Cholangiogram Operative  07/30/2014   CLINICAL DATA:  Diabetes  EXAM: INTRAOPERATIVE CHOLANGIOGRAM  TECHNIQUE: Cholangiographic images from the C-arm fluoroscopic device were submitted for interpretation post-operatively. Please see the procedural report for the amount of contrast and the fluoroscopy time utilized.  COMPARISON:  None.  FINDINGS: Contrast fills the biliary tree and duodenum without filling defects in the common bile duct  IMPRESSION: Patent biliary tree without evidence of common bile duct stone.   Electronically Signed   By: Maryclare Bean M.D.   On: 07/30/2014 14:56     Anti-infectives: Anti-infectives   Start     Dose/Rate Route Frequency Ordered Stop   07/31/14 1130  ciprofloxacin (CIPRO) IVPB 400 mg     400 mg 200 mL/hr over 60 Minutes Intravenous Every 12 hours 07/31/14 1041     07/27/14 1400  piperacillin-tazobactam (ZOSYN) IVPB 3.375 g  Status:  Discontinued     3.375 g 12.5 mL/hr over 240 Minutes Intravenous Every 8 hours 07/27/14 0741 07/31/14 1020   07/27/14 0200  piperacillin-tazobactam (ZOSYN) IVPB 3.375 g     3.375 g 12.5 mL/hr over 240 Minutes Intravenous  Once 07/26/14 2335 07/27/14 0906   07/26/14 2345  piperacillin-tazobactam (ZOSYN) IVPB 3.375 g  Status:  Discontinued     3.375 g 12.5 mL/hr over 240 Minutes Intravenous  Once 07/26/14 2332 07/26/14 2334   07/26/14 1930  vancomycin (VANCOCIN) IVPB 1000 mg/200 mL premix     1,000 mg 200 mL/hr over 60 Minutes Intravenous  Once 07/26/14 1929 07/26/14 2140   07/26/14 1930  piperacillin-tazobactam (ZOSYN) IVPB 3.375 g     3.375 g 100 mL/hr over 30 Minutes Intravenous  Once 07/26/14 1928/07/14 07/26/14 2040      Assessment/Plan: s/p Procedure(s): LAPAROSCOPIC CHOLECYSTECTOMY WITH INTRAOPERATIVE CHOLANGIOGRAM (N/A) Advance diet no evidence of surgical complications at this point.   Add bowel regimen.  Pt appears to be constipated.    LOS: 6 days    [facility] 08/01/2014

## 2014-08-02 ENCOUNTER — Encounter (HOSPITAL_COMMUNITY): Payer: Self-pay | Admitting: General Surgery

## 2014-08-02 ENCOUNTER — Ambulatory Visit: Payer: Medicare Other | Admitting: Family Medicine

## 2014-08-02 LAB — CULTURE, BLOOD (ROUTINE X 2)
Culture: NO GROWTH
Culture: NO GROWTH

## 2014-08-02 LAB — GLUCOSE, CAPILLARY
GLUCOSE-CAPILLARY: 142 mg/dL — AB (ref 70–99)
GLUCOSE-CAPILLARY: 148 mg/dL — AB (ref 70–99)
Glucose-Capillary: 113 mg/dL — ABNORMAL HIGH (ref 70–99)
Glucose-Capillary: 129 mg/dL — ABNORMAL HIGH (ref 70–99)

## 2014-08-02 MED ORDER — CIPROFLOXACIN HCL 500 MG PO TABS
500.0000 mg | ORAL_TABLET | Freq: Two times a day (BID) | ORAL | Status: DC
  Administered 2014-08-02 – 2014-08-04 (×5): 500 mg via ORAL
  Filled 2014-08-02 (×8): qty 1

## 2014-08-02 NOTE — Progress Notes (Signed)
[facility] PROGRESS NOTE  BEXLEY MCLESTER ZOX:[id number] DOB: 06-Dec-1926 DOA: 07/26/2014  PCP: [name], MD  Brief HPI: 80yo male presented with nausea/vomiting and altered mental status. He was noted to have a fever in the ED. Possible UTI versus cholangitis due to abnormal LFT. It was felt his presentation was mostly due to biliary issues, Patient underwent cholecystectomy.  Past medical history:  Past Medical History  Diagnosis Date  . Essential hypertension, benign   . Mixed hyperlipidemia   . Near syncope   . CAD (coronary artery disease)     Nonobstructive by cath 06/2012  . Bradycardia   . Diabetes mellitus without complication   . Prostate cancer   . Anxiety   . GERD (gastroesophageal reflux disease)   . Seizures   . History of echocardiogram     a. 2D ECHO: 07/29/2014  EF 60-65%, no RWMAs. G1DD. Mild TR with  PASP 31 mmHg.    Consultants: GI, General Surgery, Cardiology  Procedures:  2D ECHO:  Impressions:- Normal LV wall thickness with LVEF 81-19%, grade 1 diastolic dysfunction. Trivial mitral and aortic regurgitation. Mild tricuspid regurgitation with PASP 31 mmHg.  Lap Chole 7/31  Antibiotics: Zosyn 7/27-->8/1 Cipro 8/1-->  Subjective: Patient continues to feel better. Daughter at bedside. Mobilized some. Waiting on PT. Had a BM today.  Objective: Vital Signs  Filed Vitals:   08/01/14 1745 08/01/14 2201 08/02/14 0625 08/02/14 0956  BP: 153/81 137/63 148/64 140/60  Pulse: 65 66 57 55  Temp: 98.4 F (36.9 C) 98.6 F (37 C) 98 F (36.7 C) 98 F (36.7 C)  TempSrc: Oral Oral Oral Oral  Resp: 18 18 18 18   Height:      Weight:  64.6 kg (142 lb 6.7 oz)    SpO2: 97% 98% 97% 96%    Intake/Output Summary (Last 24 hours) at 08/02/14 1248 Last data filed at 08/02/14 0900  Gross per 24 hour  Intake    125 ml  Output    950 ml  Net   -825 ml   Filed Weights   07/28/14 0708 07/29/14 1934 08/01/14 2201  Weight: 61.1 kg (134 lb 11.2 oz)  63.4 kg (139 lb 12.4 oz) 64.6 kg (142 lb 6.7 oz)    General appearance: Awake and alert Resp: clear to auscultation bilaterally Cardio: regular rate and rhythm, S1, S2 normal, no murmur, click, rub or gallop GI: soft, mild tenderness diffusely. No rebound rigidity or guarding. BS present. No masses.  Extremities: No edema Pulses: 2+ and symmetric Neurologic: Awake and alert. No focal deficits.   Lab Results:  Basic Metabolic Panel:  Recent Labs Lab 07/27/14 0516 07/28/14 0241 07/29/14 0601 07/31/14 0318 08/01/14 0520  NA 141 139 139 135* 136*  K 3.4* 3.6* 3.3* 3.8 3.4*  CL 105 103 106 104 102  CO2 23 18* 23 18* 22  GLUCOSE 113* 150* 104* 182* 132*  BUN 16 19 14 16 12   CREATININE 1.18 1.38* 1.14 1.14 1.06  CALCIUM 8.9 8.7 8.5 7.9* 8.1*  MG  --   --   --  1.7  --    Liver Function Tests:  Recent Labs Lab 07/27/14 0516 07/28/14 0241 07/29/14 0601 07/31/14 0318 08/01/14 0520  AST 114* 64* 45* 55* 42*  ALT 131* 91* 56* 50 44  ALKPHOS 132* 104 77 70 66  BILITOT 2.1* 2.0* 1.0 0.8 0.7  PROT 6.8 6.9 6.3 6.0 5.9*  ALBUMIN 3.3* 3.1* 2.6* 2.5* 2.4*    Recent Labs  Lab 07/26/14 2130  LIPASE 9*   CBC:  Recent Labs Lab 07/26/14 1929 07/27/14 0516 07/28/14 0241 07/29/14 0601 07/31/14 0318 08/01/14 0520  WBC 11.8* 10.9* 8.1 8.9 8.5 8.3  NEUTROABS 10.3*  --   --   --   --   --   HGB 14.3 14.0 14.3 12.9* 12.2* 11.9*  HCT 39.5 39.4 38.9* 35.5* 34.3* 33.5*  MCV 92.5 93.1 91.5 91.5 91.2 91.5  PLT 162 159 132* 131* 147* 167   CBG:  Recent Labs Lab 08/01/14 1201 08/01/14 1711 08/01/14 2159 08/02/14 0735 08/02/14 1137  GLUCAP 104* 140* 129* 113* 142*    Recent Results (from the past 240 hour(s))  URINE CULTURE     Status: None   Collection Time    07/26/14  7:52 PM      Result Value Ref Range Status   Specimen Description URINE, CATHETERIZED   Final   Special Requests NONE   Final   Culture  Setup Time     Final   Value: 07/27/2014 14:28     Performed  at [place]     Final   Value: NO GROWTH     Performed at [facility]   Culture     Final   Value: NO GROWTH     Performed at [facility]   Report Status 07/28/2014 FINAL   Final  CULTURE, BLOOD (ROUTINE X 2)     Status: None   Collection Time    07/26/14  8:11 PM      Result Value Ref Range Status   Specimen Description BLOOD LEFT FOREARM   Final   Special Requests BOTTLES DRAWN AEROBIC AND ANAEROBIC [facility] EACH   Final   Culture  Setup Time     Final   Value: 07/27/2014 19:14     Performed at [facility]   Culture     Final   Value: ESCHERICHIA COLI     Note: SUSCEPTIBILITIES PERFORMED ON PREVIOUS CULTURE WITHIN THE LAST 5 DAYS.     Note: Gram Stain Report Called to,Read Back By and Verified With: DICKERSONM 1110 07/27/14 BY Elza Rafter Performed at [facility]     Performed at [facility]   Report Status 07/29/2014 FINAL   Final  CULTURE, BLOOD (ROUTINE X 2)     Status: None   Collection Time    07/26/14  8:11 PM      Result Value Ref Range Status   Specimen Description BLOOD LEFT ANTECUBITAL   Final   Special Requests     Final   Value: BOTTLES DRAWN AEROBIC AND ANAEROBIC AEB=6CC ANA=4CC   Culture  Setup Time     Final   Value: 07/27/2014 18:58     Performed at [facility]   Culture     Final   Value: ESCHERICHIA COLI     Note: Gram Stain Report Called to,Read Back By and Verified With: Cory Roughen 1110 07/27/14 BY Elza Rafter Performed at [facility]     Performed at [facility]   Report Status 07/29/2014 FINAL   Final   Organism ID, Bacteria ESCHERICHIA COLI   Final  CULTURE, BLOOD (ROUTINE X 2)     Status: None   Collection Time    07/28/14  2:41 AM      Result Value Ref Range Status   Specimen Description BLOOD LEFT FOREARM   Final   Special Requests  BOTTLES DRAWN AEROBIC AND ANAEROBIC 6CC EACH   Final   Culture NO GROWTH 5 DAYS   Final   Report Status 08/02/2014 FINAL   Final   CULTURE, BLOOD (ROUTINE X 2)     Status: None   Collection Time    07/28/14  2:41 AM      Result Value Ref Range Status   Specimen Description BLOOD LEFT HAND   Final   Special Requests     Final   Value: BOTTLES DRAWN AEROBIC AND ANAEROBIC AEB=6CC ANA=8CC   Culture NO GROWTH 5 DAYS   Final   Report Status 08/02/2014 FINAL   Final      Studies/Results: No results found.  Medications:  Scheduled: . amLODipine  10 mg Oral Daily  . bisacodyl  10 mg Rectal Daily  . ciprofloxacin  400 mg Intravenous Q12H  . insulin aspart  0-15 Units Subcutaneous TID WC  . insulin aspart  0-5 Units Subcutaneous QHS  . pantoprazole  40 mg Oral Q1200  . polyethylene glycol  17 g Oral Daily  . senna-docusate  1 tablet Oral BID  . simvastatin  20 mg Oral QHS   Continuous:   QIO:[id number], acetaminophen, LORazepam, nitroGLYCERIN, ondansetron (ZOFRAN) IV, ondansetron, oxyCODONE-acetaminophen, traMADol  Assessment/Plan:  Active Problems:   Essential hypertension, benign   Diabetes mellitus   Encephalopathy acute   Abnormal LFTs   Altered mental status   Bacteremia   Preoperative cardiovascular examination   Acute cholecystitis    E coli Bacteremia  From biliary process: acute cholangitis versus acute cholecystitis. Now s/p Lap chole. Urine culture was negative. Now on Cipro based on sensitivities.  Acute cholangitis versus acute cholecystitis  Patient had fever, transaminitis, mild jaundice and RUQ pain, per ultrasound no CBD stones. HIDA report as above. GI was consulted. And then General surgery was consulted as well. Cardiology consulted for preoperative evaluation and they deemed him low to intermediate risk. ECHO report was reviewed. Normal EF without wall motion abnormalities. Patient has non obstructive CAD based on cath from July 2013. Patient was transferred to [facility] due to inconsistent surgical coverage at [facility] for next 10 days. He was seen by Dr. Hulen Skains and underwent surgery on  7/31. Seems to be stable. Now on Cipro. Will change to oral. Had a BM. Continue laxatives as well. Followed by surgery. Oral intake is improving.  Urinary Retention Lot of difficulty with urinating on 8/1. Ultimately a coude catheter had to be placed. Will attempt voiding trial after PT has seen today. Has history of prostate cancer.  Possible UTI  Urine culture was negative. UA was abnormal.  Transaminitis  As above. Improved.  Acute encephalitis  Seems to be close to baseline. Confusion was secondary to acute illness, bacteremia.   History of HTN Stable. Continue to monitor  History of DM2 On SSI. Monitor CBG. Holding oral agents as oral intake has been poor.  Code Status: Full Code  DVT Prophylaxis: Heparin    Family Communication: Discussed with patient and Daughter at bedside. Disposition Plan: Lives at home with family. PT continues to recommend SNF which family has refused in past. Have discussed with family again. They will discuss among themselves. Will consult CSW. Continue PT/OT. Anticipate discharge in next 1-2 days.    LOS: 7 days   [place] [facility] Pager [phone] 08/02/2014, 12:48 PM  If 8PM-8AM, please contact night-coverage at [email or web address], password [email or web address]   Disclaimer: This note was dictated with voice recognition software. Similar sounding words can inadvertently  be transcribed and may not be corrected upon review.

## 2014-08-02 NOTE — Evaluation (Signed)
Occupational Therapy Evaluation Patient Details Name: Kenneth Potter MRN: [id number] DOB: 1926-03-01 Today's Date: 08/02/2014    History of Present Illness  80 yo male transferred from [facility] to [facility] due to [place] and underwent cholecystectomy. Pt currently under workup for UTI vs cholangitis. Pt lives at home with family and family has refused SNF in the past   Clinical Impression   Patient is s/p cholecystectomy surgery resulting in functional limitations due to the deficits listed below (see OT problem list). PTA living at home with family assistance. If pt declines SNF placement : pt will need w/c for d/c planning.  Patient will benefit from skilled OT acutely to increase independence and safety with ADLS to allow discharge SNF. OT to follow acutely for adl retraining and balance transfers. Pt could benefit from energy conservation education to help with ADL management at home level if they refuse SNF     Follow Up Recommendations  SNF;Supervision/Assistance - 24 hour (if family declines pt will need w/c for transfers)    Equipment Recommendations  Other (comment) (defer SNF)    Recommendations for Other Services       Precautions / Restrictions Precautions Precautions: Fall      Mobility Bed Mobility Overal bed mobility: Needs Assistance Bed Mobility: Supine to Sit;Rolling Rolling: Mod assist   Supine to sit: Max assist     General bed mobility comments: v/c for hand placement and to push up with LT UE  Transfers Overall transfer level: Needs assistance Equipment used: Rolling walker (2 wheeled) Transfers: Sit to/from [employer] Sit to Stand: +2 physical assistance;Max assist Stand pivot transfers: +2 physical assistance;Max assist       General transfer comment: cues for hand placement and tactile tapping to initiate LE advancement. pt would initiate movement if tactile input provide to LE    Balance Overall balance assessment: Needs  assistance Sitting-balance support: Bilateral upper extremity supported;Feet supported Sitting balance-Leahy Scale: Fair     Standing balance support: Bilateral upper extremity supported;During functional activity Standing balance-Leahy Scale: Poor                              ADL Overall ADL's : Needs assistance/impaired     Grooming: Wash/dry face;Min guard;Sitting       Lower Body Bathing: Total assistance           Toilet Transfer: +2 for physical assistance;Maximal assistance;Stand-pivot;RW;BSC           Functional mobility during ADLs: +2 for physical assistance;Maximal assistance;Rolling walker General ADL Comments: Pt progressing to EOB with total +2 (A) to complete transfer. pT educated on log rolling to decr abdominal pain. pt sitting eob wtih inital abdomen pain but resolved with prolonged sitting. pt progressing from EOB to chair . RN called to room at beginning of session to address leaking IV and new dressing applied. pt positioned iwth pillows in chair to prevent Rt lateral lean. Pt unable to self correct lean with cueing     Vision                     Perception     Praxis      Pertinent Vitals/Pain Abdominal discomfort with mobility      Hand Dominance Right   Extremity/Trunk Assessment Upper Extremity Assessment Upper Extremity Assessment: Generalized weakness   Lower Extremity Assessment Lower Extremity Assessment: Defer to PT evaluation   Cervical / Trunk Assessment  Cervical / Trunk Assessment: Kyphotic   Communication Communication Communication: No difficulties (delayed response to questions)   Cognition Arousal/Alertness: Awake/alert Behavior During Therapy: Flat affect Overall Cognitive Status: Impaired/Different from baseline Area of Impairment: Safety/judgement;Awareness         Safety/Judgement: Decreased awareness of deficits   Problem Solving: Slow processing;Decreased initiation;Difficulty  sequencing General Comments: Pt correctly idenifications family in room by name and relationship. Pt very slow to respond to questions and pauses to answer questions.    General Comments       Exercises       Shoulder Instructions      Home Living Family/patient expects to be discharged to:: Private residence Living Arrangements: Children Available Help at Discharge: Available 24 hours/day Type of Home: House Home Access: Stairs to enter   Entrance Stairs-Rails: Right Home Layout: One level     Bathroom Shower/Tub: Tub/shower unit Shower/tub characteristics: Architectural technologist: Standard     Home Equipment: Clinical cytogeneticist - standard          Prior Functioning/Environment Level of Independence: Needs assistance  Gait / Transfers Assistance Needed: Pt was (I) with bed mobility skills, transfers, and ambulation skills ADL's / Homemaking Assistance Needed: Pt required assist from family for bathing, dressing, and cooking        OT Diagnosis: Generalized weakness;Cognitive deficits;Acute pain   OT Problem List: Decreased strength;Decreased activity tolerance;Decreased range of motion;Impaired balance (sitting and/or standing);Decreased cognition;Decreased safety awareness;Decreased knowledge of use of DME or AE;Decreased knowledge of precautions;Cardiopulmonary status limiting activity;Pain   OT Treatment/Interventions: Self-care/ADL training;Therapeutic exercise;DME and/or AE instruction;Therapeutic activities;Energy conservation;Cognitive remediation/compensation;Patient/family education;Balance training    OT Goals(Current goals can be found in the care plan section) Acute Rehab OT Goals Patient Stated Goal: to get oob OT Goal Formulation: With patient/family Time For Goal Achievement: 08/16/14 Potential to Achieve Goals: Good  OT Frequency: Min 2X/week   Barriers to D/C:            Co-evaluation              End of Session Equipment Utilized  During Treatment: Gait belt;Rolling walker Nurse Communication: Mobility status;Precautions  Activity Tolerance: Patient limited by fatigue Patient left: in chair;with call bell/phone within reach;with family/visitor present   Time: 1111-1134 OT Time Calculation (min): 23 min Charges:  OT General Charges $OT Visit: 1 Procedure OT Evaluation $Initial OT Evaluation Tier I: 1 Procedure OT Treatments $Self Care/Home Management : 8-22 mins G-Codes:    Peri Maris 08-29-14, 1:56 PM Pager: [phone]

## 2014-08-02 NOTE — Progress Notes (Signed)
Physical Therapy Treatment Patient Details Name: FARIS COOLMAN MRN: [id number] DOB: 14-Dec-1926 Today's Date: 08/02/2014    History of Present Illness  80 yo male transferred from [facility] to [facility] due to [place] and underwent cholecystectomy. Pt currently under workup for UTI vs cholangitis. Pt lives at home with family and family has refused SNF in the past    PT Comments    Very slow progress, but quite willing to work with PT; Worth considering CIR for postacute rehab to maximize independence and safety with mobility prior to dc home; have placed CIR screen  Follow Up Recommendations  CIR (worth consideration)     Equipment Recommendations  None recommended by PT    Recommendations for Other Services       Precautions / Restrictions Precautions Precautions: Fall Restrictions Weight Bearing Restrictions: No    Mobility  Bed Mobility Overal bed mobility: Needs Assistance Bed Mobility: Sit to Supine Rolling: Mod assist     Sit to supine: Max assist   General bed mobility comments: Verbal and tactile cueing for technique to lie down and to roll; physical assist to get LEs into bed  Transfers Overall transfer level: Needs assistance Equipment used: Rolling walker (2 wheeled) Transfers: Sit to/from [employer] Transfers Sit to Stand: +2 physical assistance;Max assist Stand pivot transfers: +2 physical assistance;Max assist       General transfer comment: Tends to pull up on RW desptie cues for hand placement; Max asist to rise and +2 assist/suport during transition steps recliner to [facility] to bed  Ambulation/Gait             General Gait Details: Planned for next session   Stairs            Wheelchair Mobility    Modified Rankin (Stroke Patients Only)       Balance Overall balance assessment: Needs assistance Sitting-balance support: Bilateral upper extremity supported Sitting balance-Leahy Scale: Fair Sitting balance - Comments: Occassional  min assist to maintain while at EOB    Standing balance support: Bilateral upper extremity supported Standing balance-Leahy Scale: Poor Standing balance comment: posterior lean                    Cognition Arousal/Alertness: Awake/alert Behavior During Therapy: Flat affect Overall Cognitive Status: Impaired/Different from baseline Area of Impairment: Safety/judgement;Awareness     Memory: Decreased short-term memory   Safety/Judgement: Decreased awareness of deficits Awareness: Intellectual Problem Solving: Slow processing;Decreased initiation;Difficulty sequencing General Comments: slow to respond to questions    Exercises      General Comments General comments (skin integrity, edema, etc.): Noted small loose BM during session      Pertinent Vitals/Pain no apparent distress     Home Living                      Prior Function            PT Goals (current goals can now be found in the care plan section) Progress towards PT goals: Progressing toward goals    Frequency  Min 3X/week    PT Plan Discharge plan needs to be updated    Co-evaluation             End of Session Equipment Utilized During Treatment: Gait belt Activity Tolerance: Patient tolerated treatment well Patient left: in bed;with call bell/phone within reach;with family/visitor present     Time: 1500-1530 PT Time Calculation (min): 30 min  Charges:  $Therapeutic Activity:  23-37 mins                    G Codes:      Roney Marion [facility] 08/02/2014, 5:23 PM Roney Marion, [place] Pager [phone] Office [phone]

## 2014-08-02 NOTE — Progress Notes (Signed)
Patient ID: Kenneth Potter, male   DOB: 01-Apr-1926, 80 y.o.   MRN: [id number] 3 Days Post-Op  Subjective: Patient without complaints. He is tolerating a regular diet. Hasn't had a bowel movement.  Objective: Vital signs in last 24 hours: Temp:  [98 F (36.7 C)-98.6 F (37 C)] 98 F (36.7 C) (08/03 0956) Pulse Rate:  [55-66] 55 (08/03 0956) Resp:  [18] 18 (08/03 0956) BP: (137-153)/(60-81) 140/60 mmHg (08/03 0956) SpO2:  [96 %-98 %] 96 % (08/03 0956) Weight:  [142 lb 6.7 oz (64.6 kg)] 142 lb 6.7 oz (64.6 kg) (08/02 2201) Last BM Date: 07/28/14  Intake/Output from previous day: 08/02 0701 - 08/03 0700 In: 20 [P.O.:20] Out: 1250 [Urine:1250] Intake/Output this shift: Total I/O In: 120 [P.O.:120] Out: -   PE: Abd: Soft, minimally tender, active bowel sounds, incisions are clean, dry, and intact with dressings in place.  Lab Results:   Recent Labs  07/31/14 0318 08/01/14 0520  WBC 8.5 8.3  HGB 12.2* 11.9*  HCT 34.3* 33.5*  PLT 147* 167   BMET  Recent Labs  07/31/14 0318 08/01/14 0520  NA 135* 136*  K 3.8 3.4*  CL 104 102  CO2 18* 22  GLUCOSE 182* 132*  BUN 16 12  CREATININE 1.14 1.06  CALCIUM 7.9* 8.1*   PT/INR No results found for this basename: LABPROT, INR,  in the last 72 hours CMP     Component Value Date/Time   NA 136* 08/01/2014 0520   K 3.4* 08/01/2014 0520   CL 102 08/01/2014 0520   CO2 22 08/01/2014 0520   GLUCOSE 132* 08/01/2014 0520   BUN 12 08/01/2014 0520   CREATININE 1.06 08/01/2014 0520   CREATININE 1.13 04/05/2014 1200   CALCIUM 8.1* 08/01/2014 0520   PROT 5.9* 08/01/2014 0520   ALBUMIN 2.4* 08/01/2014 0520   AST 42* 08/01/2014 0520   ALT 44 08/01/2014 0520   ALKPHOS 66 08/01/2014 0520   BILITOT 0.7 08/01/2014 0520   GFRNONAA 61* 08/01/2014 0520   GFRNONAA 51* 11/30/2013 1027   GFRAA 71* 08/01/2014 0520   GFRAA 59* 11/30/2013 1027   Lipase     Component Value Date/Time   LIPASE 9* 07/26/2014 2130       Studies/Results: No results  found.  Anti-infectives: Anti-infectives   Start     Dose/Rate Route Frequency Ordered Stop   07/31/14 1130  ciprofloxacin (CIPRO) IVPB 400 mg     400 mg 200 mL/hr over 60 Minutes Intravenous Every 12 hours 07/31/14 1041     07/27/14 1400  piperacillin-tazobactam (ZOSYN) IVPB 3.375 g  Status:  Discontinued     3.375 g 12.5 mL/hr over 240 Minutes Intravenous Every 8 hours 07/27/14 0741 07/31/14 1020   07/27/14 0200  piperacillin-tazobactam (ZOSYN) IVPB 3.375 g     3.375 g 12.5 mL/hr over 240 Minutes Intravenous  Once 07/26/14 2335 07/27/14 0906   07/26/14 2345  piperacillin-tazobactam (ZOSYN) IVPB 3.375 g  Status:  Discontinued     3.375 g 12.5 mL/hr over 240 Minutes Intravenous  Once 07/26/14 2332 07/26/14 2334   07/26/14 1930  vancomycin (VANCOCIN) IVPB 1000 mg/200 mL premix     1,000 mg 200 mL/hr over 60 Minutes Intravenous  Once 07/26/14 1929 07/26/14 2140   07/26/14 1930  piperacillin-tazobactam (ZOSYN) IVPB 3.375 g     3.375 g 100 mL/hr over 30 Minutes Intravenous  Once 07/26/14 1929 07/26/14 2040       Assessment/Plan   1. POD 3, status post laparoscopic cholecystectomy  with intraoperative cholangiogram   Plan: 1. Patient is surgically stable for discharge home. He should return to see [place] in about 3 weeks for a postoperative visit. 2. The patient is having some issues with deconditioning. The medicine team is taking care of this. It appears the patient may be in the hospital for a couple more days due to this. We will sign off at this time. Please call us if needed.  LOS: 7 days    Artist Bloom E 08/02/2014, 11:10 AM Pager: [phone]

## 2014-08-02 NOTE — Progress Notes (Signed)
Patient is alternating between multiple PVCs, NSR, and irregular heart rate.  Patient is resting comfortably, and has no complaints of pain.  Oxy saturation 99%.  NP on call notified.  Will continue to monitor.

## 2014-08-02 NOTE — Progress Notes (Signed)
Case discussed with PA Chart reviewed  Please call if needed  Leighton Ruff. Redmond Pulling, MD, FACS General, Bariatric, & Minimally Invasive Surgery [facility], [place]

## 2014-08-03 LAB — CBC
HCT: 33.2 % — ABNORMAL LOW (ref 39.0–52.0)
HEMOGLOBIN: 11.8 g/dL — AB (ref 13.0–17.0)
MCH: 33 pg (ref 26.0–34.0)
MCHC: 35.5 g/dL (ref 30.0–36.0)
MCV: 92.7 fL (ref 78.0–100.0)
Platelets: 224 10*3/uL (ref 150–400)
RBC: 3.58 MIL/uL — AB (ref 4.22–5.81)
RDW: 13.4 % (ref 11.5–15.5)
WBC: 7.2 10*3/uL (ref 4.0–10.5)

## 2014-08-03 LAB — BASIC METABOLIC PANEL
Anion gap: 12 (ref 5–15)
BUN: 12 mg/dL (ref 6–23)
CO2: 21 mEq/L (ref 19–32)
Calcium: 8.2 mg/dL — ABNORMAL LOW (ref 8.4–10.5)
Chloride: 108 mEq/L (ref 96–112)
Creatinine, Ser: 0.98 mg/dL (ref 0.50–1.35)
GFR calc Af Amer: 84 mL/min — ABNORMAL LOW (ref >90)
GFR calc non Af Amer: 72 mL/min — ABNORMAL LOW (ref >90)
GLUCOSE: 117 mg/dL — AB (ref 70–99)
POTASSIUM: 3.6 meq/L — AB (ref 3.7–5.3)
Sodium: 141 mEq/L (ref 137–147)

## 2014-08-03 LAB — GLUCOSE, CAPILLARY
GLUCOSE-CAPILLARY: 160 mg/dL — AB (ref 70–99)
Glucose-Capillary: 134 mg/dL — ABNORMAL HIGH (ref 70–99)
Glucose-Capillary: 110 mg/dL — ABNORMAL HIGH (ref 70–99)
Glucose-Capillary: 149 mg/dL — ABNORMAL HIGH (ref 70–99)

## 2014-08-03 MED ORDER — OXYCODONE-ACETAMINOPHEN 5-325 MG PO TABS: 1.0000 | ORAL_TABLET | Freq: Four times a day (QID) | ORAL | Status: DC | PRN

## 2014-08-03 MED ORDER — POTASSIUM CHLORIDE CRYS ER 20 MEQ PO TBCR
40.0000 meq | EXTENDED_RELEASE_TABLET | Freq: Once | ORAL | Status: AC
  Administered 2014-08-03: 40 meq via ORAL
  Filled 2014-08-03: qty 2

## 2014-08-03 MED ORDER — ACETAMINOPHEN 500 MG PO TABS: 500.0000 mg | ORAL_TABLET | Freq: Four times a day (QID) | ORAL | Status: DC | PRN

## 2014-08-03 MED ORDER — CIPROFLOXACIN HCL 500 MG PO TABS: 500.0000 mg | ORAL_TABLET | Freq: Two times a day (BID) | ORAL | Status: DC

## 2014-08-03 MED ORDER — BISACODYL 10 MG RE SUPP: 10.0000 mg | Freq: Every day | RECTAL | Status: DC | PRN

## 2014-08-03 MED ORDER — SENNOSIDES-DOCUSATE SODIUM 8.6-50 MG PO TABS: 1.0000 | ORAL_TABLET | Freq: Two times a day (BID) | ORAL | Status: DC

## 2014-08-03 MED ORDER — POLYETHYLENE GLYCOL 3350 17 G PO PACK: 17.0000 g | PACK | Freq: Every day | ORAL | Status: DC

## 2014-08-03 NOTE — Clinical Social Work Psychosocial (Signed)
**Note Kenneth-Identified via Obfuscation** Clinical Social Work Department BRIEF PSYCHOSOCIAL ASSESSMENT 08/03/2014  Patient:  Kenneth Potter, Kenneth Potter     Account Number:  [id number]     Admit date:  07/26/2014  Clinical Social Worker:  Frederico Hamman  Date/Time:  08/03/2014 04:07 AM  Referred by:  Physician  Date Referred:  08/03/2014 Referred for  SNF Placement   Other Referral:   Interview type:  Patient Other interview type:   CSW also talked with 2 of patient's daughters who are at the bedside.    PSYCHOSOCIAL DATA Living Status:  FAMILY Admitted from facility:   Level of care:   Primary support name:  Kenneth Potter Primary support relationship to patient:  CHILD, ADULT Degree of support available:   Patient lives with daughter Kenneth Potter in [place]. POA is Kenneth Potter [phone]).    CURRENT CONCERNS Current Concerns  Post-Acute Placement   Other Concerns:    SOCIAL WORK ASSESSMENT / PLAN CSW received consult 8/4 regarding SNF placement for patient. Daughters initially refused placement, but later determined that patient would benefit from short-term rehab. CSW talked with patient and 2 of his daughters at the bedside: Kenneth Potter [facility]) and Kenneth Potter [facility]) regarding short-term rehab. Patient lives with his daughter Kenneth Potter who works during the day. All of the siblings who live nearby assist as they can with caring for patient per sisters.  Patient and his family are now in agreement and preferred facilities are [facility] and [facility]. Per daughter, patient lives very close to [place]. CSW informed that siblings Kenneth Potter) and Kenneth Potter) are the [employer] and CSW talked by phone with Kenneth Potter (in patient's room) regarding rehab and she reported that both facilities take patient's insurance.    CSW explained the SNF search process and provided daughters with a SNF list for [facility].   Assessment/plan status:  Psychosocial Support/Ongoing Assessment of Needs Other  assessment/ plan:   Information/referral to community resources:   SNF list for [facility]    PATIENT'S/FAMILY'S RESPONSE TO PLAN OF CARE: Daughters receptive to talking with CSW about short-term rehab. Patient primarily listened as CSW and daughters talked.

## 2014-08-03 NOTE — Clinical Social Work Placement (Addendum)
Clinical Social Work Department CLINICAL SOCIAL WORK PLACEMENT NOTE 08/03/2014  Patient:  Kenneth Potter, Kenneth Potter  Account Number:  [id number] Admit date:  07/26/2014  Clinical Social Worker:  Tata Timmins Givens, LCSW  Date/time:  08/03/2014 04:28 AM  Clinical Social Work is seeking post-discharge placement for this patient at the following level of care:   SKILLED NURSING   (*CSW will update this form in Epic as items are completed)   08/03/2014  Patient/family provided with [place] Department of Clinical Social Work's list of facilities offering this level of care within the geographic area requested by the patient (or if unable, by the patient's family).  08/03/2014  Patient/family informed of their freedom to choose among providers that offer the needed level of care, that participate in Medicare, Medicaid or managed care program needed by the patient, have an available bed and are willing to accept the patient.    Patient/family informed of [facility]' ownership interest in [facility], as well as of the fact that they are under no obligation to receive care at this facility.  PASARR submitted to EDS on 07/28/14 PASARR number received on 07/28/14 - [phone] [id number]  FL2 transmitted to all facilities in geographic area requested by pt/family on  08/03/2014 FL2 transmitted to all facilities within larger geographic area on   Patient informed that his/her managed care company has contracts with or will negotiate with  certain facilities, including the following:     Patient/family informed of bed offers received: 08/03/14  Patient chooses bed at [facility] Physician recommends and patient chooses bed at    Patient to be transferred to [facility] on 08/04/14   Patient to be transferred to facility by ambulance Patient and family notified of transfer on 08/04/14 Name of family member notified: CSW talked with two of patient's daughter's at the bedside on 8/5-Lisa and Linda.   The  following physician request were entered in Epic:   Additional Comments:

## 2014-08-03 NOTE — Progress Notes (Signed)
Received request for prescreen for inpatient rehab and I have reviewed pt's case. Pt had [facility]) Medicare and based on pt's current diagnosis of encephalopathy s/p cholecystectomy, it is highly unlikely that [facility] would give authorization for acute inpatient rehab.  At this point, our recommendation would be to pursue skilled nursing or home with home health/family support.   Please call me with any questions. Thanks.  Nanetta Batty, PT Rehabilitation Admissions Coordinator [phone]

## 2014-08-03 NOTE — Progress Notes (Addendum)
[facility] PROGRESS NOTE  Kenneth Potter GMW:[id number] DOB: 1926/11/10 DOA: 07/26/2014  PCP: [name], MD  Brief HPI: 80yo male presented with nausea/vomiting and altered mental status. He was noted to have a fever in the ED. Possible UTI versus cholangitis due to abnormal LFT. It was felt his presentation was mostly due to biliary issues. Patient underwent cholecystectomy.  Past medical history:  Past Medical History  Diagnosis Date  . Essential hypertension, benign   . Mixed hyperlipidemia   . Near syncope   . CAD (coronary artery disease)     Nonobstructive by cath 06/2012  . Bradycardia   . Diabetes mellitus without complication   . Prostate cancer   . Anxiety   . GERD (gastroesophageal reflux disease)   . Seizures   . History of echocardiogram     a. 2D ECHO: 07/29/2014  EF 60-65%, no RWMAs. G1DD. Mild TR with  PASP 31 mmHg.    Consultants: GI, General Surgery, Cardiology  Procedures:  2D ECHO:  Impressions:- Normal LV wall thickness with LVEF 44-03%, grade 1 diastolic dysfunction. Trivial mitral and aortic regurgitation. Mild tricuspid regurgitation with PASP 31 mmHg.  Lap Chole 7/31  Antibiotics: Zosyn 7/27-->8/1 Cipro 8/1-->  Subjective: Patient feels well. Denies any pain. Was able to urinate. Had BM as well.  Objective: Vital Signs  Filed Vitals:   08/02/14 1745 08/02/14 2200 08/03/14 0614 08/03/14 0858  BP: 138/70 150/65 134/64 130/79  Pulse: 56 72 85 69  Temp: 98.2 F (36.8 C) 98.2 F (36.8 C) 98.4 F (36.9 C) 98.3 F (36.8 C)  TempSrc: Oral Oral Oral Oral  Resp: 18 18 18 18   Height:      Weight:  61.5 kg (135 lb 9.3 oz)    SpO2: 95% 97% 96% 96%    Intake/Output Summary (Last 24 hours) at 08/03/14 1147 Last data filed at 08/03/14 0842  Gross per 24 hour  Intake    120 ml  Output    630 ml  Net   -510 ml   Filed Weights   07/29/14 1934 08/01/14 2201 08/02/14 2200  Weight: 63.4 kg (139 lb 12.4 oz) 64.6 kg (142 lb 6.7 oz)  61.5 kg (135 lb 9.3 oz)    General appearance: Awake and alert Resp: clear to auscultation bilaterally Cardio: regular rate and rhythm, S1, S2 normal, no murmur, click, rub or gallop GI: soft, mild tenderness diffusely. No rebound rigidity or guarding. BS present. No masses.  Extremities: No edema Neurologic: Awake and alert. No focal deficits.   Lab Results:  Basic Metabolic Panel:  Recent Labs Lab 07/28/14 0241 07/29/14 0601 07/31/14 0318 08/01/14 0520 08/03/14 0346  NA 139 139 135* 136* 141  K 3.6* 3.3* 3.8 3.4* 3.6*  CL 103 106 104 102 108  CO2 18* 23 18* 22 21  GLUCOSE 150* 104* 182* 132* 117*  BUN 19 14 16 12 12   CREATININE 1.38* 1.14 1.14 1.06 0.98  CALCIUM 8.7 8.5 7.9* 8.1* 8.2*  MG  --   --  1.7  --   --    Liver Function Tests:  Recent Labs Lab 07/28/14 0241 07/29/14 0601 07/31/14 0318 08/01/14 0520  AST 64* 45* 55* 42*  ALT 91* 56* 50 44  ALKPHOS 104 77 70 66  BILITOT 2.0* 1.0 0.8 0.7  PROT 6.9 6.3 6.0 5.9*  ALBUMIN 3.1* 2.6* 2.5* 2.4*   CBC:  Recent Labs Lab 07/28/14 0241 07/29/14 0601 07/31/14 0318 08/01/14 0520 08/03/14 0346  WBC 8.1  8.9 8.5 8.3 7.2  HGB 14.3 12.9* 12.2* 11.9* 11.8*  HCT 38.9* 35.5* 34.3* 33.5* 33.2*  MCV 91.5 91.5 91.2 91.5 92.7  PLT 132* 131* 147* 167 224   CBG:  Recent Labs Lab 08/02/14 0735 08/02/14 1137 08/02/14 1633 08/02/14 2157 08/03/14 0749  GLUCAP 113* 142* 148* 129* 134*    Recent Results (from the past 240 hour(s))  URINE CULTURE     Status: None   Collection Time    07/26/14  7:52 PM      Result Value Ref Range Status   Specimen Description URINE, CATHETERIZED   Final   Special Requests NONE   Final   Culture  Setup Time     Final   Value: 07/27/2014 14:28     Performed at [facility]     Final   Value: NO GROWTH     Performed at [facility]   Culture     Final   Value: NO GROWTH     Performed at [facility]   Report Status 07/28/2014 FINAL    Final  CULTURE, BLOOD (ROUTINE X 2)     Status: None   Collection Time    07/26/14  8:11 PM      Result Value Ref Range Status   Specimen Description BLOOD LEFT FOREARM   Final   Special Requests BOTTLES DRAWN AEROBIC AND ANAEROBIC [facility] EACH   Final   Culture  Setup Time     Final   Value: 07/27/2014 19:14     Performed at [facility]   Culture     Final   Value: ESCHERICHIA COLI     Note: SUSCEPTIBILITIES PERFORMED ON PREVIOUS CULTURE WITHIN THE LAST 5 DAYS.     Note: Gram Stain Report Called to,Read Back By and Verified With: DICKERSONM 1110 07/27/14 BY Tyrone Schimke M Performed at [facility]     Performed at [facility]   Report Status 07/29/2014 FINAL   Final  CULTURE, BLOOD (ROUTINE X 2)     Status: None   Collection Time    07/26/14  8:11 PM      Result Value Ref Range Status   Specimen Description BLOOD LEFT ANTECUBITAL   Final   Special Requests     Final   Value: BOTTLES DRAWN AEROBIC AND ANAEROBIC AEB=6CC ANA=4CC   Culture  Setup Time     Final   Value: 07/27/2014 18:58     Performed at [facility]   Culture     Final   Value: ESCHERICHIA COLI     Note: Gram Stain Report Called to,Read Back By and Verified With: Cory Roughen 1110 07/27/14 BY Elza Rafter Performed at [facility]     Performed at [facility]   Report Status 07/29/2014 FINAL   Final   Organism ID, Bacteria ESCHERICHIA COLI   Final  CULTURE, BLOOD (ROUTINE X 2)     Status: None   Collection Time    07/28/14  2:41 AM      Result Value Ref Range Status   Specimen Description BLOOD LEFT FOREARM   Final   Special Requests BOTTLES DRAWN AEROBIC AND ANAEROBIC [place]   Final   Culture NO GROWTH 5 DAYS   Final   Report Status 08/02/2014 FINAL   Final  CULTURE, BLOOD (ROUTINE X 2)     Status: None   Collection Time    07/28/14  2:41  AM      Result Value Ref Range Status   Specimen Description BLOOD LEFT HAND   Final   Special Requests     Final   Value: BOTTLES  DRAWN AEROBIC AND ANAEROBIC AEB=6CC ANA=8CC   Culture NO GROWTH 5 DAYS   Final   Report Status 08/02/2014 FINAL   Final      Studies/Results: No results found.  Medications:  Scheduled: . amLODipine  10 mg Oral Daily  . bisacodyl  10 mg Rectal Daily  . ciprofloxacin  500 mg Oral BID  . insulin aspart  0-15 Units Subcutaneous TID WC  . insulin aspart  0-5 Units Subcutaneous QHS  . pantoprazole  40 mg Oral Q1200  . polyethylene glycol  17 g Oral Daily  . senna-docusate  1 tablet Oral BID  . simvastatin  20 mg Oral QHS   Continuous:   GBT:DVVOHYWVPXTGG, acetaminophen, LORazepam, nitroGLYCERIN, ondansetron (ZOFRAN) IV, ondansetron, oxyCODONE-acetaminophen, traMADol  Assessment/Plan:  Active Problems:   Essential hypertension, benign   Diabetes mellitus   Encephalopathy acute   Abnormal LFTs   Altered mental status   Bacteremia   Preoperative cardiovascular examination   Acute cholecystitis    E coli Bacteremia  From biliary process: acute cholangitis versus acute cholecystitis. Now s/p Lap chole. Urine culture was negative. Now on Cipro based on sensitivities.  Acute cholangitis versus acute cholecystitis  Patient had fever, transaminitis, mild jaundice and RUQ pain, per ultrasound no CBD stones. HIDA report as above. GI was consulted. And then General surgery was consulted as well. Cardiology consulted for preoperative evaluation and they deemed him low to intermediate risk. ECHO report was reviewed. Normal EF without wall motion abnormalities. Patient has non obstructive CAD based on cath from July 2013. Patient was transferred to [facility] due to inconsistent surgical coverage at [facility]. He was seen by Dr. Hulen Skains and underwent surgery on 7/31. Seems to be stable. Now on oral Cipro. Has had BM. Continue laxatives. Surgery has signed off. Oral intake is improving. Still deconditioned. Will need SNF.  Urinary Retention Lot of difficulty with urinating on 8/1. Ultimately a coude  catheter had to be placed. This was removed 8/3. Urinating on own now. Mobilize daily. Has history of prostate cancer.  Possible UTI  Urine culture was negative. UA was abnormal.  Transaminitis  As above. Improved.  Acute encephalitis  Seems to be close to baseline. Confusion was secondary to acute illness, bacteremia.   History of HTN Stable. Continue to monitor  History of DM2 On SSI. Monitor CBG. Holding oral agents as oral intake has been poor. Would recommend holding it at the SNF as well for now.  Code Status: Full Code  DVT Prophylaxis: Heparin    Family Communication: Discussed with Daughter, De Blanch over the phone. Disposition Plan: Lived at home with family PTA. PT continues to recommend SNF which family has refused in past. I discussed with De Blanch (daughter). She is now agreeable for SNF placement. Informed CSW. Likely discharge 8/5.     LOS: 8 days   [facility] Pager [phone] 08/03/2014, 11:47 AM  If 8PM-8AM, please contact night-coverage at [email or web address], password [email or web address]   Disclaimer: This note was dictated with voice recognition software. Similar sounding words can inadvertently be transcribed and may not be corrected upon review.

## 2014-08-03 NOTE — Discharge Summary (Signed)
[facility]  Physician Discharge Summary   Patient ID: Kenneth Potter MRN: [id number] DOB/AGE: Nov 18, 1926 80 y.o.  Admit date: 07/26/2014 Discharge date: 08/03/2014  PCP: [name], MD  DISCHARGE DIAGNOSES:  Active Problems:   Essential hypertension, benign   Diabetes mellitus   Encephalopathy acute   Abnormal LFTs   Altered mental status   Bacteremia   Preoperative cardiovascular examination   Acute cholecystitis   RECOMMENDATIONS FOR OUTPATIENT FOLLOW UP: 1. Please check CBG's three times daily with meals. Initiate SSI as per SNF protocol. 2. Resume Januvia if CBG's start increasing and once his oral intake improves as well.  DISCHARGE CONDITION: fair  Diet recommendation: Mod Carb Dys 3 Diet  Filed Weights   07/29/14 1934 08/01/14 2201 08/02/14 2200  Weight: 63.4 kg (139 lb 12.4 oz) 64.6 kg (142 lb 6.7 oz) 61.5 kg (135 lb 9.3 oz)    INITIAL HISTORY: 80yo male presented with nausea/vomiting and altered mental status. He was noted to have a fever in the ED. Possible UTI versus cholangitis due to abnormal LFT. It was felt his presentation was mostly due to biliary issues. Patient underwent cholecystectomy.  Consultants: GI, General Surgery, Cardiology   Procedures:  2D ECHO:  Impressions:- Normal LV wall thickness with LVEF 67-59%, grade 1 diastolic dysfunction. Trivial mitral and aortic regurgitation. Mild tricuspid regurgitation with PASP 31 mmHg.   Lap Chole 7/31   Antibiotics:  Zosyn 7/27-->8/1  Cipro 8/1-->  HOSPITAL COURSE:  Acute cholangitis versus acute cholecystitis  Patient had fever, transaminitis, mild jaundice and RUQ pain, per ultrasound no CBD stones. HIDA scan showed depressed EF. GI was consulted. And then General surgery was consulted as well. Cardiology consulted for preoperative evaluation and they deemed him low to intermediate risk. ECHO report was reviewed. Normal EF without wall motion abnormalities. Patient has non obstructive  CAD based on cath from July 2013. Patient was transferred to [facility] due to inconsistent surgical coverage at [facility]. He was seen by Dr. Hulen Skains and underwent surgery on 7/31. He was constipated post operatively. He was given laxatives with response. He is medically stable. Just very deconditioned after surgery. He has been seen by PT and OT. SNF was recommended. Surgery has signed off. They will see him in followup in their office.   E coli Bacteremia  This was due to biliary process: acute cholangitis versus acute cholecystitis. Now he is s/p Lap chole. Urine culture was negative. Now on Cipro based on sensitivities. Should continue for now. See medication list.  Urinary Retention  He had a lot of difficulty with urinating on 8/1. Ultimately a coude catheter had to be placed. This was removed 8/3. Urinating on own now. He will need to be mobilized daily. He has history of prostate cancer.   Possible UTI  Urine culture was negative.   Transaminitis  As above. Improved.   Acute encephalitis  Seems to be close to baseline. Confusion was secondary to acute illness, bacteremia.   History of HTN  Stable. BP climbing. Can resume his lisinopril.  History of DM2  Holding oral agents as oral intake has been poor. Would recommend holding it at the SNF as well for now. Check CBg with SSI at SNF.  Overall he is stable. Family finally agreeable to SNF. CSW to pursue today. Anticipate discharge 8/5. Discussed with daughter, De Blanch.   PERTINENT LABS:  The results of significant diagnostics from this hospitalization (including imaging, microbiology, ancillary and laboratory) are listed below for reference.  Microbiology: Recent Results (from the past 240 hour(s))  URINE CULTURE     Status: None   Collection Time    07/26/14  7:52 PM      Result Value Ref Range Status   Specimen Description URINE, CATHETERIZED   Final   Special Requests NONE   Final   Culture  Setup Time     Final   Value:  07/27/2014 14:28     Performed at [facility]     Final   Value: NO GROWTH     Performed at [facility]   Culture     Final   Value: NO GROWTH     Performed at [facility]   Report Status 07/28/2014 FINAL   Final  CULTURE, BLOOD (ROUTINE X 2)     Status: None   Collection Time    07/26/14  8:11 PM      Result Value Ref Range Status   Specimen Description BLOOD LEFT FOREARM   Final   Special Requests BOTTLES DRAWN AEROBIC AND ANAEROBIC [facility] EACH   Final   Culture  Setup Time     Final   Value: 07/27/2014 19:14     Performed at [facility]   Culture     Final   Value: ESCHERICHIA COLI     Note: SUSCEPTIBILITIES PERFORMED ON PREVIOUS CULTURE WITHIN THE LAST 5 DAYS.     Note: Gram Stain Report Called to,Read Back By and Verified With: DICKERSONM 1110 07/27/14 BY Tyrone Schimke M Performed at [facility]     Performed at [facility]   Report Status 07/29/2014 FINAL   Final  CULTURE, BLOOD (ROUTINE X 2)     Status: None   Collection Time    07/26/14  8:11 PM      Result Value Ref Range Status   Specimen Description BLOOD LEFT ANTECUBITAL   Final   Special Requests     Final   Value: BOTTLES DRAWN AEROBIC AND ANAEROBIC AEB=6CC ANA=4CC   Culture  Setup Time     Final   Value: 07/27/2014 18:58     Performed at [facility]   Culture     Final   Value: ESCHERICHIA COLI     Note: Gram Stain Report Called to,Read Back By and Verified With: Cory Roughen 1110 07/27/14 BY Elza Rafter Performed at [facility]     Performed at [facility]   Report Status 07/29/2014 FINAL   Final   Organism ID, Bacteria ESCHERICHIA COLI   Final  CULTURE, BLOOD (ROUTINE X 2)     Status: None   Collection Time    07/28/14  2:41 AM      Result Value Ref Range Status   Specimen Description BLOOD LEFT FOREARM   Final   Special Requests BOTTLES DRAWN AEROBIC AND ANAEROBIC [place]   Final   Culture NO GROWTH 5 DAYS   Final   Report  Status 08/02/2014 FINAL   Final  CULTURE, BLOOD (ROUTINE X 2)     Status: None   Collection Time    07/28/14  2:41 AM      Result Value Ref Range Status   Specimen Description BLOOD LEFT HAND   Final   Special Requests     Final   Value: BOTTLES DRAWN AEROBIC AND ANAEROBIC AEB=6CC ANA=8CC   Culture NO GROWTH 5 DAYS   Final   Report Status 08/02/2014 FINAL  Final     Labs: Basic Metabolic Panel:  Recent Labs Lab 07/28/14 0241 07/29/14 0601 07/31/14 0318 08/01/14 0520 08/03/14 0346  NA 139 139 135* 136* 141  K 3.6* 3.3* 3.8 3.4* 3.6*  CL 103 106 104 102 108  CO2 18* 23 18* 22 21  GLUCOSE 150* 104* 182* 132* 117*  BUN 19 14 16 12 12   CREATININE 1.38* 1.14 1.14 1.06 0.98  CALCIUM 8.7 8.5 7.9* 8.1* 8.2*  MG  --   --  1.7  --   --    Liver Function Tests:  Recent Labs Lab 07/28/14 0241 07/29/14 0601 07/31/14 0318 08/01/14 0520  AST 64* 45* 55* 42*  ALT 91* 56* 50 44  ALKPHOS 104 77 70 66  BILITOT 2.0* 1.0 0.8 0.7  PROT 6.9 6.3 6.0 5.9*  ALBUMIN 3.1* 2.6* 2.5* 2.4*   CBC:  Recent Labs Lab 07/28/14 0241 07/29/14 0601 07/31/14 0318 08/01/14 0520 08/03/14 0346  WBC 8.1 8.9 8.5 8.3 7.2  HGB 14.3 12.9* 12.2* 11.9* 11.8*  HCT 38.9* 35.5* 34.3* 33.5* 33.2*  MCV 91.5 91.5 91.2 91.5 92.7  PLT 132* 131* 147* 167 224   BNP: BNP (last 3 results)  Recent Labs  04/15/14 1145  PROBNP 183.0   CBG:  Recent Labs Lab 08/02/14 0735 08/02/14 1137 08/02/14 1633 08/02/14 2157 08/03/14 0749  GLUCAP 113* 142* 148* 129* 134*     IMAGING STUDIES Dg Cholangiogram Operative  07/30/2014   CLINICAL DATA:  Diabetes  EXAM: INTRAOPERATIVE CHOLANGIOGRAM  TECHNIQUE: Cholangiographic images from the C-arm fluoroscopic device were submitted for interpretation post-operatively. Please see the procedural report for the amount of contrast and the fluoroscopy time utilized.  COMPARISON:  None.  FINDINGS: Contrast fills the biliary tree and duodenum without filling defects in the  common bile duct  IMPRESSION: Patent biliary tree without evidence of common bile duct stone.   Electronically Signed   By: Maryclare Bean M.D.   On: 07/30/2014 14:56   US Abdomen Complete  07/27/2014   CLINICAL DATA:  Abnormal liver function tests.  EXAM: ULTRASOUND ABDOMEN COMPLETE  COMPARISON:  08/07/2013 abdominal ultrasound.  FINDINGS: Gallbladder:  Nonspecific gallbladder wall thickening is present. Small non shadowing stones are present, with the largest measuring 5 mm. The gallbladder wall measures up to 7 mm. No pericholecystic fluid. No sonographic Murphy sign.  Common bile duct:  Diameter: 9 mm.  No common duct stone.  Liver:  Hepatic cysts are present in the RIGHT and LEFT lobes. RIGHT hepatic lobe cyst measures 27 mm x 26 mm x 31 mm. LEFT hepatic lobe cyst measures 11 mm x 5.4 mm by 8 mm. Increased hepatic echotexture compared to the adjacent RIGHT kidney, most compatible with hepatosteatosis.  IVC:  No abnormality visualized.  Pancreas:  Visualized portion unremarkable.  Spleen:  4.3 cm.  Right Kidney:  Length: 10 cm. Simple upper pole renal cyst measures 34 mm x 30 mm x 30 mm. No hydronephrosis. Mildly hypoechoic subtle cystic lesion is present in the RIGHT inferior renal pole measuring 12 mm. Probable calculus in the inferior pole measuring 6 mm.  Left Kidney:  Length: 14.7 cm.  8 cm interpolar renal cyst.  Abdominal aorta:  Atherosclerosis.  Other findings:  None.  IMPRESSION: 1. New gallbladder wall thickening with gallstones. Imaging findings are compatible with cholecystitis however the absence of a sonographic Percell Miller sign is unusual in acute cholecystitis but these findings can be seen in chronic cholecystitis. HIDA scan may be useful in further  assessment. 2. Echogenic liver consistent with hepatosteatosis. No intrahepatic biliary ductal dilation. No common duct stone. 3. Hepatic and renal cysts. RIGHT inferior pole hypoechoic rounded lesion measuring 12 mm probably represents a hemorrhagic or  proteinaceous cyst. Follow-up ultrasound in 6 months recommended to assess for stability.   Electronically Signed   By: Dereck Ligas M.D.   On: 07/27/2014 10:23   Nm Hepato W/eject Fract  07/27/2014   CLINICAL DATA:  RIGHT upper quadrant pain, history hypertension, mixed hyperlipidemia, diabetes, coronary artery disease, former smoker, prostate cancer  EXAM: NUCLEAR MEDICINE HEPATOBILIARY IMAGING WITH GALLBLADDER EF  TECHNIQUE: Sequential images of the abdomen were obtained out to 60 minutes following intravenous administration of radiopharmaceutical. After slow intravenous infusion of micrograms Cholecystokinin, gallbladder ejection fraction was determined.  RADIOPHARMACEUTICALS:  5 mCi Tc-7m Choletec IV  COMPARISON:  None  FINDINGS: Normal tracer extraction from bloodstream indicating normal hepatocellular function.  Normal excretion of tracer into biliary tree.  Gallbladder visualized by 20 min.  Small bowel visualized by 28 min.  No focal hepatic retention of tracer.  Only mild emptying of tracer from the gallbladder occured following CCK stimulation.  Calculated gallbladder ejection fraction is 27%, mildly decreased.  Patient related no symptoms following CCK administration.  IMPRESSION: Patent biliary tree.  Mildly decreased gallbladder response to CCK stimulation with a decreased ejection fraction of 27%.   Electronically Signed   By: Lavonia Dana M.D.   On: 07/27/2014 15:55   Dg Chest Portable 1 View  07/26/2014   CLINICAL DATA:  Altered mental status, fever, history of prostate cancer  EXAM: PORTABLE CHEST - 1 VIEW  COMPARISON:  04/15/2014  FINDINGS: Low lung volumes with mild bibasilar opacities, likely atelectasis. No focal consolidation. No pleural effusion or pneumothorax.  The heart is top-normal in size.  Right shoulder arthroplasty.  IMPRESSION: Low lung volumes with mild bibasilar opacities, likely atelectasis.  No evidence of acute cardiopulmonary disease.   Electronically Signed   By:  Julian Hy M.D.   On: 07/26/2014 19:55    DISPOSITION: SNF  Discharge Instructions   Discharge diet:    Complete by:  As directed   Diabetic Dys 3 diet     Discharge instructions    Complete by:  As directed   Check CBG's three times daily with meals. Cover with Sliding scale as per SNF. We are holding his diabetic medication due to poor oral intake. Consider resuming if CBG starts increasing.     Increase activity slowly    Complete by:  As directed            ALLERGIES:  Allergies  Allergen Reactions  . Codeine Nausea Only    Nausea per family  . Aspirin     Can not tolerate in high dosage    Current Discharge Medication List    START taking these medications   Details  acetaminophen (TYLENOL) 500 MG tablet Take 1 tablet (500 mg total) by mouth every 6 (six) hours as needed for moderate pain or fever. Qty: 30 tablet, Refills: 0    bisacodyl (DULCOLAX) 10 MG suppository Place 1 suppository (10 mg total) rectally daily as needed for moderate constipation. Qty: 12 suppository, Refills: 0    ciprofloxacin (CIPRO) 500 MG tablet Take 1 tablet (500 mg total) by mouth 2 (two) times daily. For 6 more days    oxyCODONE-acetaminophen (PERCOCET/ROXICET) 5-325 MG per tablet Take 1 tablet by mouth every 6 (six) hours as needed for moderate pain or severe pain. Qty:  30 tablet, Refills: 0    polyethylene glycol (MIRALAX / GLYCOLAX) packet Take 17 g by mouth daily. Qty: 14 each, Refills: 0    senna-docusate (SENOKOT-S) 8.6-50 MG per tablet Take 1 tablet by mouth 2 (two) times daily.      CONTINUE these medications which have NOT CHANGED   Details  amLODipine (NORVASC) 10 MG tablet Take 10 mg by mouth daily.    FIBER SELECT GUMMIES PO Take by mouth daily.    Iron-Vitamins (GERITOL TONIC PO) Take by mouth every 30 (thirty) days.    lisinopril (PRINIVIL,ZESTRIL) 20 MG tablet Take 10 mg by mouth 2 (two) times daily.    omeprazole (PRILOSEC) 20 MG capsule Take 20 mg by  mouth daily.    simvastatin (ZOCOR) 20 MG tablet Take 20 mg by mouth at bedtime.    nitroGLYCERIN (NITROSTAT) 0.4 MG SL tablet Place 1 tablet (0.4 mg total) under the tongue every 5 (five) minutes as needed for chest pain. Qty: 20 tablet, Refills: 3      STOP taking these medications     sitaGLIPtin (JANUVIA) 50 MG tablet        Follow-up Information   Follow up with [facility] On 08/24/2014. (For post-operation check. Your appointment is at 3:15pm, please arrive at least 30 min before your appointment to complete your check in paperwork.  If you are unable to arrive 30 min prior to your appointment time we may have to cancel or reschedule you)    Contact information:   [place] [address] [phone]       Follow up with [name], MD. Schedule an appointment as soon as possible for a visit in 3 weeks. (post hospitalization follow up)    Specialty:  Family Medicine   Contact information:   [place] [address] [phone]       TOTAL DISCHARGE TIME: 23 mins  [facility] Pager [phone]  08/03/2014, 11:57 AM  Disclaimer: This note was dictated with voice recognition software. Similar sounding words can inadvertently be transcribed and may not be corrected upon review.

## 2014-08-03 NOTE — Progress Notes (Signed)
Reviewed and agree  Reyaansh Merlo, PT  Acute Rehabilitation Services Pager [phone] Office [phone]  

## 2014-08-03 NOTE — Progress Notes (Signed)
Physical Therapy Treatment Patient Details Name: Kenneth Potter MRN: [id number] DOB: February 12, 1926 Today's Date: 08/03/2014    History of Present Illness  80 yo male transferred from [facility] to [facility] due to [place] and underwent cholecystectomy. Pt currently under workup for UTI vs cholangitis. Pt lives at home with family and family has refused SNF in the past    PT Comments    Pt tolerated treatment well and had improved cognition and functional mobility compared to yesterday. He successfully walked 30' with 2WRW and on Room Air, with SpO2 remaining at or above 92%. Pt cued for diaphragmatic breathing throughout session. Pt is +1 Mod Assist with bed mobility, transfers, and ambulation. Pt requires frequent verbal and tactile cueing for hand placement, safety, and frequency. Family is now agreeable to SNF and pt will benefit from continued PT treatment in order to improve functional mobility and independence.   Follow Up Recommendations  SNF     Equipment Recommendations  None recommended by PT (Pt has rolling walker and shower chair at home)    Recommendations for Other Services       Precautions / Restrictions Precautions Precautions: Fall Restrictions Weight Bearing Restrictions: No    Mobility  Bed Mobility Overal bed mobility: Needs Assistance (+1 for bed mobility) Bed Mobility: Sit to Sidelying;Sidelying to Sit;Rolling (Pt used bed rails unilaterally to assist with rolling) Rolling: Mod assist (+1 for trunk support and min assist with leg positioning) Sidelying to sit: Mod assist (+1 assist with trunk elevation)     Sit to sidelying: Mod assist (+1 assist for controlled lowering of trunk) General bed mobility comments: Verbal and tactile cueing for technique and event sequence  Transfers Overall transfer level: Needs assistance Equipment used: Rolling walker (2 wheeled) Transfers: Sit to/from Stand Sit to Stand: Mod assist (+1 uniilateral support under arms)          General transfer comment: Mod Assist with all transfers, pt able to help but does require frequent verbal and tactile cues (Tends to pull up on RW unless hand placement cued)  Ambulation/Gait Ambulation/Gait assistance: Min assist (Verbal Cueing for walker progression and stepping sequence) Ambulation Distance (Feet): 30 Feet (4', BM and rest, 26') Assistive device: Rolling walker (2 wheeled) Gait Pattern/deviations: Step-to pattern;Decreased stride length (Occassional step-through step )         Stairs            Wheelchair Mobility    Modified Rankin (Stroke Patients Only)       Balance Overall balance assessment: Needs assistance (Bilateral UE support sitting EOB, standing, walking) Sitting-balance support: Bilateral upper extremity supported Sitting balance-Leahy Scale: Fair Sitting balance - Comments: Occassional min assist to maintain while at EOB    Standing balance support: Bilateral upper extremity supported Standing balance-Leahy Scale: Poor                      Cognition Arousal/Alertness: Awake/alert Behavior During Therapy: WFL for tasks assessed/performed (Pt improved responsiveness and participation from yesterday) Overall Cognitive Status: Impaired/Different from baseline Area of Impairment: Following commands;Awareness (Pt required VC for hand placement and safety)       Following Commands: Follows one step commands with increased time (Also used tactile cueing for body positioning) Safety/Judgement: Decreased awareness of safety Awareness: Intellectual;Anticipatory (Aware of eminent BM during session and requested commode) Problem Solving: Slow processing General Comments: Pt able to engage in basic conversation and showed improved mood and emotions    Exercises  Marching Sitting  EOB (Both; Right; Left; 10 reps)    General Comments        Pertinent Vitals/Pain Sitting EOB prior to ambulation on Room Air: SpO2 89% Sitting EOB prior  to ambulation on Room Air w/cues for Diaphragmatic breathing: SpO2 92% Sitting EOB, post Marching on Room Air: SpO2 95%  Ambulating on Room Air: SpO2 93%  Supine in bed post Ambulation on Room Air: SpO2 96%    Home Living                      Prior Function            PT Goals (current goals can now be found in the care plan section) Acute Rehab PT Goals Patient Stated Goal: to walk Progress towards PT goals: Progressing toward goals    Frequency  Min 3X/week    PT Plan Current plan remains appropriate (Family now agreeable to d/c to SNF)    Co-evaluation             End of Session Equipment Utilized During Treatment: Gait belt (Session done without supplemental O2, sats stayed in 90%'s) Activity Tolerance: Patient tolerated treatment well Patient left: in bed;with nursing/sitter in room     Time: 1452-1530 PT Time Calculation (min): 38 min  Charges:                       G CodesSuella Broad 08/03/2014, 4:58 PM

## 2014-08-04 LAB — GLUCOSE, CAPILLARY
Glucose-Capillary: 142 mg/dL — ABNORMAL HIGH (ref 70–99)
Glucose-Capillary: 135 mg/dL — ABNORMAL HIGH (ref 70–99)
Glucose-Capillary: 115 mg/dL — ABNORMAL HIGH (ref 70–99)

## 2014-08-04 NOTE — Progress Notes (Signed)
Tele d/cd. Box 6E02 removed and returned to [place]. Nikki CMT notified. IV removed. Pt dressed/bathed for transport. Will continue to monitor.

## 2014-08-04 NOTE — Progress Notes (Signed)
Report called to [facility] at [phone], Berenice Primas. PTAR notified, pt in queue. Will continue to monitor.

## 2014-08-04 NOTE — Progress Notes (Signed)
Pt doing well.  No complaints.  Has been accepted at [facility] facility.  Med Rec & DC summary completed.  No issues.  Will plan to DC today.

## 2014-08-04 NOTE — Care Management Note (Signed)
CARE MANAGEMENT NOTE 08/04/2014  Patient:  Kenneth Potter, Kenneth Potter   Account Number:  [id number]  Date Initiated:  07/27/2014  Documentation initiated by:  CHILDRESS,JESSICA  Subjective/Objective Assessment:   Pt admitted with AMS. Pt lives with his daughter who assists with ADL's. Pt has shower chair and walker for PRN use.     Action/Plan:   PT consult obtained. Will continue to follow for discharge planning needs.  07/30/2014 For Lap Choly today, met with family no d/c needs identified, will reeval for needs post op.   Anticipated DC Date:  08/01/2014   Anticipated DC Plan:    In-house referral  Clinical Social Worker      DC Planning Services  CM consult      Choice offered to / List presented to:             Status of service:  In process, will continue to follow Medicare Important Message given?  YES (If response is "NO", the following Medicare IM given date fields will be blank) Date Medicare IM given:  07/28/2014 Medicare IM given by:  Vladimir Creeks Date Additional Medicare IM given:  07/30/2014 Additional Medicare IM given by:  [facility]  Discharge Disposition:    Per UR Regulation:  Reviewed for med. necessity/level of care/duration of stay  If discussed at [place] of Stay Meetings, dates discussed:    Comments:  08/04/2014 Met with pt and daughter IM given and explained , plan for pt to d/c today to SNF. Adron Bene RN MPH, case manager, [phone]   07/30/2014 Met with pt and family, as they await Lap Choly to be performed today. No HH needs identified at this time, will follow for needs post op. Noted pt has walker and shower chair per family. Will arranged [place] if needed post op. Second IM given for possible d/c over the weekend. Jasmine Pang RN MPH, case manager, [phone]  07/28/14 1500 Vladimir Creeks RN/CM    Daughter spoke with CSW today and she and family adamantly refused SNF. They will take the pt home. Spoke with daughter at bedside and she states they have never  needed help such as [place] before but may need help now as the pt is much more dependent, and unless he improves greatly, will need more care. Explained that CM will be glad to set up [facility] if needed, and she thinks they may need a hosp bed. They are having a meeting with MD later this PM and will be able to make more decisions then 07/27/2014 [place], RN, MSN, [facility]

## 2014-08-24 ENCOUNTER — Ambulatory Visit (INDEPENDENT_AMBULATORY_CARE_PROVIDER_SITE_OTHER): Payer: Medicare Other | Admitting: General Surgery

## 2014-08-24 ENCOUNTER — Encounter (INDEPENDENT_AMBULATORY_CARE_PROVIDER_SITE_OTHER): Payer: Self-pay | Admitting: General Surgery

## 2014-08-24 VITALS — BP 104/60 | HR 68 | Resp 14 | Ht 62.0 in | Wt 132.2 lb

## 2014-08-24 DIAGNOSIS — K810 Acute cholecystitis: Secondary | ICD-10-CM

## 2014-08-24 NOTE — Patient Instructions (Signed)
Follow up as needed

## 2014-08-24 NOTE — Progress Notes (Signed)
Kenneth Potter June 09, 1926 [id number] 08/24/2014   Kenneth Potter is a 80 y.o. male who had a laparoscopic cholecystectomy with intraoperative cholangiogram by Dr. Judeth Horn.  The pathology report confirmed acute and chronic cholecystitis.  The patient reports that they are feeling well with normal bowel movements and good appetite.  The pre-operative symptoms of abdominal pain, nausea, and vomiting have resolved.    Physical examination - Incisions appear well-healed with no sign of infection or bleeding.   Abdomen - soft, non-tender  Impression:  s/p laparoscopic cholecystectomy  Plan:  He may resume a regular diet and full activity.  He may follow-up on a PRN basis.

## 2014-08-25 ENCOUNTER — Telehealth: Payer: Self-pay | Admitting: Family Medicine

## 2014-08-25 NOTE — Telephone Encounter (Signed)
FYI - Pt's daughter called and states that pt is in rehab facility postsurgical and is doing really good but still requires PT. Her question is can they bring him home and have home health come to the house for his needs. She does state that his insurance will cover that, that she has checked on it already. I told her the Rehab MD could do the referral as well but she wanted to come in and see Dr. Buelah Manis so that we can do the referral. I schedule him a 30 min appt as I am not sure what all this may en tale.

## 2014-08-30 NOTE — Telephone Encounter (Signed)
They need to discuss with the rehab physician to see if he can be released with [facility] and PT at home. If he is stable this can be done, but since I am not at the rehab facility I can not release him from there

## 2014-08-31 ENCOUNTER — Ambulatory Visit (INDEPENDENT_AMBULATORY_CARE_PROVIDER_SITE_OTHER): Payer: Medicare Other | Admitting: Family Medicine

## 2014-08-31 ENCOUNTER — Encounter: Payer: Self-pay | Admitting: Family Medicine

## 2014-08-31 ENCOUNTER — Observation Stay (HOSPITAL_COMMUNITY)
Admission: EM | Admit: 2014-08-31 | Discharge: 2014-09-02 | Disposition: A | Discharge status: Home / Self Care | Payer: Medicare Other | Attending: Internal Medicine | Admitting: Internal Medicine

## 2014-08-31 ENCOUNTER — Encounter (HOSPITAL_COMMUNITY): Payer: Self-pay | Admitting: Emergency Medicine

## 2014-08-31 VITALS — BP 126/82 | HR 78 | Temp 98.2°F | Resp 14 | Ht 60.0 in | Wt 130.0 lb

## 2014-08-31 DIAGNOSIS — R531 Weakness: Secondary | ICD-10-CM

## 2014-08-31 DIAGNOSIS — Z79899 Other long term (current) drug therapy: Secondary | ICD-10-CM | POA: Insufficient documentation

## 2014-08-31 DIAGNOSIS — E119 Type 2 diabetes mellitus without complications: Secondary | ICD-10-CM

## 2014-08-31 DIAGNOSIS — R197 Diarrhea, unspecified: Principal | ICD-10-CM | POA: Diagnosis present

## 2014-08-31 DIAGNOSIS — R5383 Other fatigue: Secondary | ICD-10-CM

## 2014-08-31 DIAGNOSIS — R5381 Other malaise: Secondary | ICD-10-CM | POA: Insufficient documentation

## 2014-08-31 DIAGNOSIS — R300 Dysuria: Secondary | ICD-10-CM

## 2014-08-31 DIAGNOSIS — E860 Dehydration: Secondary | ICD-10-CM | POA: Diagnosis not present

## 2014-08-31 DIAGNOSIS — R634 Abnormal weight loss: Secondary | ICD-10-CM | POA: Diagnosis not present

## 2014-08-31 DIAGNOSIS — I498 Other specified cardiac arrhythmias: Secondary | ICD-10-CM | POA: Diagnosis not present

## 2014-08-31 DIAGNOSIS — Z9889 Other specified postprocedural states: Secondary | ICD-10-CM | POA: Diagnosis not present

## 2014-08-31 DIAGNOSIS — E1129 Type 2 diabetes mellitus with other diabetic kidney complication: Secondary | ICD-10-CM | POA: Diagnosis present

## 2014-08-31 DIAGNOSIS — K921 Melena: Secondary | ICD-10-CM | POA: Diagnosis not present

## 2014-08-31 DIAGNOSIS — I10 Essential (primary) hypertension: Secondary | ICD-10-CM | POA: Diagnosis not present

## 2014-08-31 DIAGNOSIS — A0472 Enterocolitis due to Clostridium difficile, not specified as recurrent: Secondary | ICD-10-CM

## 2014-08-31 DIAGNOSIS — I5032 Chronic diastolic (congestive) heart failure: Secondary | ICD-10-CM | POA: Diagnosis present

## 2014-08-31 DIAGNOSIS — E785 Hyperlipidemia, unspecified: Secondary | ICD-10-CM | POA: Diagnosis not present

## 2014-08-31 DIAGNOSIS — R109 Unspecified abdominal pain: Secondary | ICD-10-CM | POA: Insufficient documentation

## 2014-08-31 DIAGNOSIS — Z8546 Personal history of malignant neoplasm of prostate: Secondary | ICD-10-CM | POA: Diagnosis not present

## 2014-08-31 DIAGNOSIS — K219 Gastro-esophageal reflux disease without esophagitis: Secondary | ICD-10-CM | POA: Diagnosis not present

## 2014-08-31 DIAGNOSIS — I2510 Atherosclerotic heart disease of native coronary artery without angina pectoris: Secondary | ICD-10-CM | POA: Diagnosis not present

## 2014-08-31 DIAGNOSIS — E43 Unspecified severe protein-calorie malnutrition: Secondary | ICD-10-CM | POA: Diagnosis present

## 2014-08-31 DIAGNOSIS — Z87891 Personal history of nicotine dependence: Secondary | ICD-10-CM | POA: Diagnosis not present

## 2014-08-31 DIAGNOSIS — R001 Bradycardia, unspecified: Secondary | ICD-10-CM | POA: Diagnosis present

## 2014-08-31 DIAGNOSIS — F411 Generalized anxiety disorder: Secondary | ICD-10-CM | POA: Diagnosis not present

## 2014-08-31 DIAGNOSIS — E46 Unspecified protein-calorie malnutrition: Secondary | ICD-10-CM

## 2014-08-31 LAB — CBC
HCT: 35.4 % — ABNORMAL LOW (ref 39.0–52.0)
Hemoglobin: 11.8 g/dL — ABNORMAL LOW (ref 13.0–17.0)
MCH: 31.3 pg (ref 26.0–34.0)
MCHC: 33.3 g/dL (ref 30.0–36.0)
MCV: 93.9 fL (ref 78.0–100.0)
Platelets: 219 10*3/uL (ref 150–400)
RBC: 3.77 MIL/uL — ABNORMAL LOW (ref 4.22–5.81)
RDW: 13.8 % (ref 11.5–15.5)
WBC: 6.7 10*3/uL (ref 4.0–10.5)

## 2014-08-31 LAB — BASIC METABOLIC PANEL
Anion gap: 13 (ref 5–15)
BUN: 21 mg/dL (ref 6–23)
CO2: 22 mEq/L (ref 19–32)
Calcium: 9.2 mg/dL (ref 8.4–10.5)
Chloride: 104 mEq/L (ref 96–112)
Creatinine, Ser: 1.31 mg/dL (ref 0.50–1.35)
GFR calc Af Amer: 55 mL/min — ABNORMAL LOW (ref >90)
GFR calc non Af Amer: 48 mL/min — ABNORMAL LOW (ref >90)
Glucose, Bld: 118 mg/dL — ABNORMAL HIGH (ref 70–99)
Potassium: 4.1 mEq/L (ref 3.7–5.3)
Sodium: 139 mEq/L (ref 137–147)

## 2014-08-31 LAB — GLUCOSE, CAPILLARY: Glucose-Capillary: 92 mg/dL (ref 70–99)

## 2014-08-31 LAB — URINALYSIS, ROUTINE W REFLEX MICROSCOPIC
Glucose, UA: NEGATIVE mg/dL
Glucose, UA: NEGATIVE mg/dL
Hgb urine dipstick: NEGATIVE
Ketones, ur: 15 mg/dL — AB
Leukocytes, UA: NEGATIVE
Leukocytes, UA: NEGATIVE
Nitrite: NEGATIVE
Nitrite: NEGATIVE
Protein, ur: 100 mg/dL — AB
Protein, ur: 30 mg/dL — AB
Specific Gravity, Urine: 1.03 (ref 1.005–1.030)
UROBILINOGEN UA: 1 mg/dL (ref 0.0–1.0)
Urobilinogen, UA: 1 mg/dL (ref 0.0–1.0)
pH: 5.5 (ref 5.0–8.0)
pH: 6 (ref 5.0–8.0)

## 2014-08-31 LAB — URINE MICROSCOPIC-ADD ON

## 2014-08-31 LAB — URINALYSIS, MICROSCOPIC ONLY

## 2014-08-31 MED ORDER — DIPHENOXYLATE-ATROPINE 2.5-0.025 MG PO TABS: 1.0000 | ORAL_TABLET | Freq: Four times a day (QID) | ORAL | Status: DC | PRN

## 2014-08-31 MED ORDER — AMLODIPINE BESYLATE 10 MG PO TABS
10.0000 mg | ORAL_TABLET | Freq: Every day | ORAL | Status: DC
  Administered 2014-09-01 – 2014-09-02 (×2): 10 mg via ORAL
  Filled 2014-08-31 (×2): qty 1

## 2014-08-31 MED ORDER — INSULIN ASPART 100 UNIT/ML ~~LOC~~ SOLN
0.0000 [IU] | Freq: Three times a day (TID) | SUBCUTANEOUS | Status: DC
  Administered 2014-09-01: 2 [IU] via SUBCUTANEOUS

## 2014-08-31 MED ORDER — ONDANSETRON HCL 4 MG/2ML IJ SOLN: 4.0000 mg | Freq: Four times a day (QID) | INTRAMUSCULAR | Status: DC | PRN

## 2014-08-31 MED ORDER — ONDANSETRON HCL 4 MG PO TABS: 4.0000 mg | ORAL_TABLET | Freq: Four times a day (QID) | ORAL | Status: DC | PRN

## 2014-08-31 MED ORDER — NITROGLYCERIN 0.4 MG SL SUBL: 0.4000 mg | SUBLINGUAL_TABLET | SUBLINGUAL | Status: DC | PRN

## 2014-08-31 MED ORDER — ZOLPIDEM TARTRATE 5 MG PO TABS: 5.0000 mg | ORAL_TABLET | Freq: Every evening | ORAL | Status: DC | PRN

## 2014-08-31 MED ORDER — SODIUM CHLORIDE 0.9 % IV SOLN
INTRAVENOUS | Status: DC
  Administered 2014-09-01: 01:00:00 via INTRAVENOUS

## 2014-08-31 MED ORDER — SIMVASTATIN 20 MG PO TABS
20.0000 mg | ORAL_TABLET | Freq: Every day | ORAL | Status: DC
  Administered 2014-09-01 (×2): 20 mg via ORAL
  Filled 2014-08-31 (×3): qty 1

## 2014-08-31 MED ORDER — ENOXAPARIN SODIUM 30 MG/0.3ML ~~LOC~~ SOLN
30.0000 mg | SUBCUTANEOUS | Status: DC
  Administered 2014-09-01 (×2): 30 mg via SUBCUTANEOUS
  Filled 2014-08-31 (×3): qty 0.3

## 2014-08-31 MED ORDER — OXYCODONE-ACETAMINOPHEN 5-325 MG PO TABS: 1.0000 | ORAL_TABLET | Freq: Four times a day (QID) | ORAL | Status: DC | PRN

## 2014-08-31 MED ORDER — PANTOPRAZOLE SODIUM 40 MG PO TBEC
40.0000 mg | DELAYED_RELEASE_TABLET | Freq: Every day | ORAL | Status: DC
  Administered 2014-09-01 – 2014-09-02 (×2): 40 mg via ORAL
  Filled 2014-08-31 (×2): qty 1

## 2014-08-31 MED ORDER — SODIUM CHLORIDE 0.9 % IV BOLUS (SEPSIS)
1000.0000 mL | Freq: Once | INTRAVENOUS | Status: AC
  Administered 2014-08-31: 1000 mL via INTRAVENOUS

## 2014-08-31 MED ORDER — LISINOPRIL 10 MG PO TABS
10.0000 mg | ORAL_TABLET | Freq: Two times a day (BID) | ORAL | Status: DC
  Administered 2014-09-01 – 2014-09-02 (×4): 10 mg via ORAL
  Filled 2014-08-31 (×5): qty 1

## 2014-08-31 MED ORDER — BOOST PLUS PO LIQD
237.0000 mL | Freq: Once | ORAL | Status: AC
  Administered 2014-08-31: 237 mL via ORAL
  Filled 2014-08-31: qty 237

## 2014-08-31 NOTE — Progress Notes (Signed)
Patient ID: Kenneth Potter, male   DOB: [birth year], 80 y.o.   MRN: [id number]   Subjective:    Patient ID: Kenneth Potter, male    DOB: 15-Dec-1926, 80 y.o.   MRN: [id number]  Patient presents for Hospital F/U, Loose stools and Painful urination  patient here with his daughter and his son. He was admitted in early August secondary to Escherichia coli bacteremia as well as cholecystitis. He is status post gallbladder removal. He went to rehabilitation for about 20 days however because of insurance purposes was sent home the family felt he was not ready to go home at that time. He's had a very poor appetite with him a record of his lost 5 pounds in the past 4 weeks. He is actually not had anything to eat or drink for the past 3-4 days. He's only urinated once in the past couple of days and that was this morning it was very dark and tea colored. He's also been having copious amounts of diarrhea 6-8 stools a day which he can not control and he is wearing depends. There was a question whether he also had some blood in the stools he's been complaining of suprapubic pain as well as rectal pain for the past couple days as well. He's not had any fever. The family is having a lot of difficulty caring for him at home in his current state    Review Of Systems:  GEN- + fatigue, fever, +weight loss,weakness, recent illness HEENT- denies eye drainage, change in vision, nasal discharge, CVS- denies chest pain, palpitations RESP- denies SOB, cough, wheeze ABD- denies N/V, +change in stools, +abd pain GU- + dysuria,denies hematuria, dribbling, incontinence MSK- denies joint pain, muscle aches, injury Neuro- denies headache, dizziness, syncope, seizure activity       Objective:    BP 126/82  Pulse 78  Temp(Src) 98.2 F (36.8 C) (Oral)  Resp 14  Ht 5' (1.524 m)  Wt 130 lb (58.968 kg)  BMI 25.39 kg/m2 GEN- NAD, alert and oriented x3, weak appearing HEENT- PERRL, EOMI, non injected sclera, pink conjunctiva,  MMM, oropharynx clear Neck- Supple,  CVS- RRR, no murmur RESP-CTAB ABD-NABS,TTP diffusely worse in suprapubic  Region, no CVA tenderness Rectum- soft stool at vault and into depends, no gross blood, raw skin surrounding rectum, mild penile discharge, I and O cath, dark tea colored urine < 50 cc EXT- No edema Pulses- Radial 2+        Assessment & Plan:      Problem List Items Addressed This Visit   None    Visit Diagnoses   Dysuria    -  Primary    Urine catheter specimen does not appear to be infected a culture has been sent.    Relevant Orders       Urinalysis, Routine w reflex microscopic (Completed)       Urine culture    Dehydration        IV fluids needed patient's not eaten in 3-4 days per his urine is very dehydrated    Enteritis due to Clostridium difficile        He was on significant antibiotics when he was inpatient and concern for C. difficile based on the amount of diarrhea as well as dehydration. He needs to be admitted , I would recommend empiric treatment with Flagyl.     Protein Malnutrition- he needs supplemental by mouth intake. I recommend that he be sent to the nursing facility the family agrees with  this. They're unable to care for him at home especially his current state.     Note: This dictation was prepared with Dragon dictation along with smaller phrase technology. Any transcriptional errors that result from this process are unintentional.

## 2014-08-31 NOTE — ED Notes (Signed)
Attempted IV x 2unable to obtain IV access.  Paged IV team

## 2014-08-31 NOTE — Telephone Encounter (Signed)
LMTRC

## 2014-08-31 NOTE — ED Notes (Signed)
EDP had conversation with family about contacting hospitalist to admit patient because family states they don't feel comfortable sending him home at this time.

## 2014-08-31 NOTE — ED Notes (Signed)
Admit Doctor at bedside.  

## 2014-08-31 NOTE — ED Notes (Signed)
Provider at bedside

## 2014-08-31 NOTE — ED Notes (Signed)
IV team at bedside 

## 2014-08-31 NOTE — ED Notes (Signed)
3 family members at bedside given drinks and family requested boost for their family member. Boost ordered for patient. Patient alert answering and following commands appropriate.  Patient used urinal without incident with urine light pink in color.

## 2014-08-31 NOTE — ED Notes (Signed)
MD at bedside. 
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
## 2014-08-31 NOTE — ED Notes (Signed)
EKG completed and given to EDP.  

## 2014-08-31 NOTE — ED Notes (Signed)
Doctor at bedside.

## 2014-08-31 NOTE — ED Notes (Signed)
PT sent here by PCP for concern for possible C. Diff and UTI. Pt reports diarrhea and dysuria x 3 days. Pt has reported 5 lb weight loss in 3 days. Pt denies N/V, abdominal pain or CP. Pt is AO x4. Denies fever/chills.

## 2014-08-31 NOTE — Patient Instructions (Signed)
Go directly to ER Concern for C diff Dehydration  ? Possible UTI

## 2014-08-31 NOTE — ED Notes (Signed)
ED Doctor at bedside.  

## 2014-08-31 NOTE — ED Notes (Signed)
Doctor returned to speak with additional family members.

## 2014-09-01 ENCOUNTER — Observation Stay (HOSPITAL_COMMUNITY): Payer: Medicare Other

## 2014-09-01 DIAGNOSIS — I10 Essential (primary) hypertension: Secondary | ICD-10-CM

## 2014-09-01 DIAGNOSIS — I5032 Chronic diastolic (congestive) heart failure: Secondary | ICD-10-CM

## 2014-09-01 DIAGNOSIS — E43 Unspecified severe protein-calorie malnutrition: Secondary | ICD-10-CM | POA: Diagnosis present

## 2014-09-01 LAB — GLUCOSE, CAPILLARY
GLUCOSE-CAPILLARY: 155 mg/dL — AB (ref 70–99)
GLUCOSE-CAPILLARY: 87 mg/dL (ref 70–99)
Glucose-Capillary: 87 mg/dL (ref 70–99)
Glucose-Capillary: 90 mg/dL (ref 70–99)

## 2014-09-01 LAB — HEMOGLOBIN A1C
HEMOGLOBIN A1C: 6.2 % — AB (ref <5.7)
Mean Plasma Glucose: 131 mg/dL — ABNORMAL HIGH (ref <117)

## 2014-09-01 LAB — PRO B NATRIURETIC PEPTIDE: Pro B Natriuretic peptide (BNP): 470.8 pg/mL — ABNORMAL HIGH (ref 0–450)

## 2014-09-01 MED ORDER — HYDRALAZINE HCL 10 MG PO TABS
10.0000 mg | ORAL_TABLET | Freq: Four times a day (QID) | ORAL | Status: DC | PRN
  Filled 2014-09-01: qty 1

## 2014-09-01 MED ORDER — GLUCERNA SHAKE PO LIQD
237.0000 mL | Freq: Three times a day (TID) | ORAL | Status: DC
  Administered 2014-09-01 – 2014-09-02 (×3): 237 mL via ORAL

## 2014-09-01 NOTE — Progress Notes (Signed)
UR Completed.  Kenneth Potter [phone] 09/01/2014  

## 2014-09-01 NOTE — Progress Notes (Signed)
Pt walked length of hall twice with minimal assistance.

## 2014-09-01 NOTE — Progress Notes (Signed)
[facility]  Patient Status: Active (receiving services up to time of hospitalization)  [facility] is providing the following services: RN, PT, OT, ST and HHA  If patient discharges after hours, please call [phone].   Kenneth Potter 09/01/2014, 10:11 AM

## 2014-09-01 NOTE — Progress Notes (Signed)
PROGRESS NOTE  Kenneth Potter MVE:[id number] DOB: 1926-01-06 DOA: 08/31/2014 PCP: [name], MD  HPI/Recap of past 25 hours: 80 year old African American male with past medical history of diabetes and chronic diastolic heart failure recently discharged from skilled nursing several days before admission was at home having episodes of continuous diarrhea and poor by mouth intake and brought in by family on the evening of 9/1. Admitted to the hospital service for further workup.  Assessment/Plan: Principal Problem:   Diarrhea: Unclear etiology. C. difficile culture pending. GI pathogen panel pending. Abdominal ultrasound unrevealing. Patient normally on MiraLAX but family states they stopped this when the diarrhea started. Patient hungry so transitioned him to mechanical soft solid food  Active Problems:   Bradycardia: Stable.    HTN (hypertension), malignant: Noted elevated blood pressures. Has started when necessary by mouth hydralazine and question if some of this could be secondary to acute volume overloaded. BNP pending.    Type II or unspecified type diabetes mellitus with renal manifestations, not stated as uncontrolled: A1c at 6.2 and CBGs normal, likely in part from poor by mouth intake.    Protein-calorie malnutrition, severe: Seen by nutrition. Patient meets criteria for severe malnutrition in the context of acute illness/injury as evidenced by a 3.8% weight loss in one week plus moderate muscle mastication. Has been started on Glucerna shake by mouth 3 times a day    Chronic diastolic heart failure: Noted elevated blood pressures, BNP pending. Patient already on ACE inhibitor. History of bradycardia which is occurring even during his hospitalization so the beta blocker at this time.  History of dysphagia: Check swallow evaluation  Code Status: Full code  Family Communication: Discussed with daughter at the bedside  Disposition Plan: Need to finish workup of diarrhea plus  improvement. Also need to confirm no signs of volume overload. Confirmed with daughter the plan is for patient depending on improvement to either go home versus short-term skilled nursing only. Family not interested in long-term placement.   Consultants:  None  Procedures:  None  Antibiotics:  None  HPI/Subjective: Patient doing okay. Still feels very weak. He is unsure if his diarrhea has improved much  Objective: BP 181/82  Pulse 61  Temp(Src) 98.3 F (36.8 C) (Oral)  Resp 18  Ht 5\' 2"  (1.575 m)  Wt 57.7 kg (127 lb 3.3 oz)  BMI 23.26 kg/m2  SpO2 96%  Intake/Output Summary (Last 24 hours) at 09/01/14 1531 Last data filed at 09/01/14 0845  Gross per 24 hour  Intake      0 ml  Output    550 ml  Net   -550 ml   Filed Weights   08/31/14 2334  Weight: 57.7 kg (127 lb 3.3 oz)    Exam:   General:  Alert and oriented x2  Cardiovascular: Regular rate and rhythm, S1-S2  Respiratory: Clear to auscultation bilaterally  Abdomen: Soft, distended, nontender, hypoactive bowel sounds  Musculoskeletal: No clubbing or cyanosis, trace pitting edema   Data Reviewed: Basic Metabolic Panel:  Recent Labs Lab 08/31/14 1648  NA 139  K 4.1  CL 104  CO2 22  GLUCOSE 118*  BUN 21  CREATININE 1.31  CALCIUM 9.2   Liver Function Tests: No results found for this basename: AST, ALT, ALKPHOS, BILITOT, PROT, ALBUMIN,  in the last 168 hours No results found for this basename: LIPASE, AMYLASE,  in the last 168 hours No results found for this basename: AMMONIA,  in the last 168 hours CBC:  Recent  Labs Lab 08/31/14 1648  WBC 6.7  HGB 11.8*  HCT 35.4*  MCV 93.9  PLT 219   Cardiac Enzymes:   No results found for this basename: CKTOTAL, CKMB, CKMBINDEX, TROPONINI,  in the last 168 hours BNP (last 3 results)  Recent Labs  04/15/14 1145  PROBNP 183.0   CBG:  Recent Labs Lab 08/31/14 2321 09/01/14 0834 09/01/14 1138  GLUCAP 92 87 90    No results found for  this or any previous visit (from the past 240 hour(s)).   Studies: No results found.  Scheduled Meds: . amLODipine  10 mg Oral Daily  . enoxaparin (LOVENOX) injection  30 mg Subcutaneous Q24H  . feeding supplement (GLUCERNA SHAKE)  237 mL Oral TID BM  . insulin aspart  0-9 Units Subcutaneous TID WC  . lisinopril  10 mg Oral BID  . pantoprazole  40 mg Oral Daily  . simvastatin  20 mg Oral QHS    Continuous Infusions:    Time spent: 35 minutes  [facility] Pager [phone] If 7PM-7AM, please contact night-coverage at [email or web address], password [facility] 09/01/2014, 3:31 PM  LOS: 1 day
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
## 2014-09-01 NOTE — Progress Notes (Signed)
Note/chart reviewed.  Katie Kattleya Kuhnert, RD, LDN Pager #: [phone] After-Hours Pager #: [phone]  

## 2014-09-02 LAB — BASIC METABOLIC PANEL
Anion gap: 11 (ref 5–15)
BUN: 13 mg/dL (ref 6–23)
CALCIUM: 8.7 mg/dL (ref 8.4–10.5)
CO2: 22 meq/L (ref 19–32)
Chloride: 107 mEq/L (ref 96–112)
Creatinine, Ser: 0.99 mg/dL (ref 0.50–1.35)
GFR calc Af Amer: 83 mL/min — ABNORMAL LOW (ref >90)
GFR calc non Af Amer: 72 mL/min — ABNORMAL LOW (ref >90)
GLUCOSE: 94 mg/dL (ref 70–99)
POTASSIUM: 3.8 meq/L (ref 3.7–5.3)
SODIUM: 140 meq/L (ref 137–147)

## 2014-09-02 LAB — URINE CULTURE
Colony Count: NO GROWTH
Organism ID, Bacteria: NO GROWTH

## 2014-09-02 LAB — GLUCOSE, CAPILLARY
GLUCOSE-CAPILLARY: 109 mg/dL — AB (ref 70–99)
Glucose-Capillary: 82 mg/dL (ref 70–99)

## 2014-09-02 MED ORDER — HYDRALAZINE HCL 10 MG PO TABS: 5.0000 mg | ORAL_TABLET | Freq: Three times a day (TID) | ORAL | Status: DC

## 2014-09-02 MED ORDER — GLUCERNA SHAKE PO LIQD: 237.0000 mL | Freq: Three times a day (TID) | ORAL | Status: DC

## 2014-09-02 NOTE — Clinical Social Work Note (Signed)
CSW received consult 9/2 for SNF placement. Per attending MD, patient will discharge home when medically stable. CSW signing off, please reconsult if any other SW services needed.   Ronrico Dupin Givens, MSW, LCSW [phone]

## 2014-09-02 NOTE — Progress Notes (Signed)
Discharge instructions reviewed with pt and daughter; allowing time for questions. Pt and daughter verbalized understanding. IV removed without issue. Condom cath removed. Pt leaving unit via wheelchair accompanied by [facility] and daughter.

## 2014-09-02 NOTE — Progress Notes (Signed)
UR Completed.  [phone]  

## 2014-09-02 NOTE — Discharge Summary (Signed)
Discharge Summary  Kenneth Potter NUU:[id number] DOB: [birth year]  PCP: [name], MD  Admit date: 08/31/2014 Discharge date: 09/02/2014  Time spent: 25 minutes  Recommendations for Outpatient Follow-up:  1. Patient will followup with his primary care doctor the next one month as needed 2. Have advised him to change his laxatives and stool softeners to as needed instead of daily  3. New medication: Hydralazine 5 mg by mouth 3 times a day 4. New medication: Glucerna shakes by mouth 3 times a day  Discharge Diagnoses:  Active Hospital Problems   Diagnosis Date Noted  . Diarrhea 08/31/2014  . Protein-calorie malnutrition, severe 09/01/2014  . Chronic diastolic heart failure 34/74/2595  . Type II or unspecified type diabetes mellitus with renal manifestations, not stated as uncontrolled 11/20/2012  . HTN (hypertension), malignant   . Bradycardia 02/26/2012    Resolved Hospital Problems   Diagnosis Date Noted Date Resolved  No resolved problems to display.    Discharge Condition: Improved, being discharged home  Diet recommendation: Low sodium carb modified  Filed Weights   08/31/14 2334 09/01/14 2120  Weight: 57.7 kg (127 lb 3.3 oz) 60 kg (132 lb 4.4 oz)    History of present illness:  80 year old male with past medical history of diabetes and chronic diastolic heart failure who had been recently discharged from a skilled nursing facility several days prior to admission was at home having episodes of continuous diarrhea and poor by mouth intake and brought in by family on the evening of 9/1  Hospital Course:  Principal Problem:   Diarrhea: Patient's daily laxatives and stool softeners were held. Stool cultures were unrevealing. C. difficile culture done in patient's PCP office, however on its own during his hospitalization it resolves and he was able tolerate solid food. In addition, his appetite improved and he was able to actually ambulate up and down the hallway several  times with minimal if any assistance. Likely this was a gastroenteritis versus continuing patient's MiraLAX. Have changed stool softener and laxatives as needed. Patient felt to be stable to go home Active Problems:   Bradycardia: Patient's heart rate stayed in the low 60s/early 50s. This is the reason he cannot be on a beta blocker despite history of heart failure    HTN (hypertension), malignant: Despite patient continue home medications, at times blood pressures elevated into the 180s requiring doses of when necessary hydralazine. Have added low-dose hydralazine at 5 mg by mouth 3 times a day to have better regulate blood pressure    Type II or unspecified type diabetes mellitus with renal manifestations, not stated as uncontrolled: Stable. A1c checked and found to be 6.2 on admission.    Protein-calorie malnutrition, severe: Patient met criteria for severe malnutrition in the setting of acute illness/injury as evidenced by 3.8% weight loss in one week and moderate muscle mastication. Seen by nutrition. Started on Glucerna shake by mouth 3 times a day which patient received prescription for upon discharge.    Chronic diastolic heart failure: BNP checked and found to be normal at 480. Patient already on ACE inhibitor and diuretic.   Procedures:  None  Consultations:  Nutrition  Discharge Exam: BP 131/70  Pulse 65  Temp(Src) 98.5 F (36.9 C) (Oral)  Resp 17  Ht $R'5\' 2"'qn$  (1.575 m)  Wt 60 kg (132 lb 4.4 oz)  BMI 24.19 kg/m2  SpO2 100%  General: Alert and oriented x2, no acute distress Cardiovascular: Regular rate and rhythm, G3-O7, 2/6 systolic ejection murmur Respiratory: Clear  to auscultation bilaterally  Discharge Instructions You were cared for by a hospitalist during your hospital stay. If you have any questions about your discharge medications or the care you received while you were in the hospital after you are discharged, you can call the unit and asked to speak with the  hospitalist on call if the hospitalist that took care of you is not available. Once you are discharged, your primary care physician will handle any further medical issues. Please note that NO REFILLS for any discharge medications will be authorized once you are discharged, as it is imperative that you return to your primary care physician (or establish a relationship with a primary care physician if you do not have one) for your aftercare needs so that they can reassess your need for medications and monitor your lab values.  Discharge Instructions   Diet - low sodium heart healthy    Complete by:  As directed      Increase activity slowly    Complete by:  As directed             Medication List    STOP taking these medications       FIBER SELECT GUMMIES PO     polyethylene glycol packet  Commonly known as:  MIRALAX / GLYCOLAX     senna-docusate 8.6-50 MG per tablet  Commonly known as:  Senokot-S      TAKE these medications       acetaminophen 500 MG tablet  Commonly known as:  TYLENOL  Take 1 tablet (500 mg total) by mouth every 6 (six) hours as needed for moderate pain or fever.     amLODipine 10 MG tablet  Commonly known as:  NORVASC  Take 10 mg by mouth daily.     bisacodyl 10 MG suppository  Commonly known as:  DULCOLAX  Place 1 suppository (10 mg total) rectally daily as needed for moderate constipation.     feeding supplement (GLUCERNA SHAKE) Liqd  Take 237 mLs by mouth 3 (three) times daily between meals.     hydrALAZINE 10 MG tablet  Commonly known as:  APRESOLINE  Take 0.5 tablets (5 mg total) by mouth 3 (three) times daily.     lisinopril 20 MG tablet  Commonly known as:  PRINIVIL,ZESTRIL  Take 10 mg by mouth 2 (two) times daily.     nitroGLYCERIN 0.4 MG SL tablet  Commonly known as:  NITROSTAT  Place 1 tablet (0.4 mg total) under the tongue every 5 (five) minutes as needed for chest pain.     omeprazole 20 MG capsule  Commonly known as:  PRILOSEC    Take 20 mg by mouth daily.     oxyCODONE-acetaminophen 5-325 MG per tablet  Commonly known as:  PERCOCET/ROXICET  Take 1 tablet by mouth every 6 (six) hours as needed for moderate pain or severe pain.     simvastatin 20 MG tablet  Commonly known as:  ZOCOR  Take 20 mg by mouth at bedtime.       Allergies  Allergen Reactions  . Codeine Nausea Only    Nausea per family  . Aspirin     Can not tolerate in high dosage       Follow-up Information   Follow up with [name], MD. (Call to schedule appointment to follow up with your primary care provider tomorrow for recheck of symptoms as needed. )    Specialty:  Family Medicine   Contact information:   [phone] [place] [address] [phone]       Follow up with [facility]. (Return to ER If symptoms worsen)    Specialty:  Emergency Medicine   Contact information:   [address] [id number] [place] [address] [phone]       The results of significant diagnostics from this hospitalization (including imaging, microbiology, ancillary and laboratory) are listed below for reference.    Significant Diagnostic Studies: US Abdomen Complete  09/01/2014   CLINICAL DATA:  Abdominal pain, diabetes, cholecystectomy  EXAM: ULTRASOUND ABDOMEN COMPLETE  COMPARISON:  07/19/2014  FINDINGS: Gallbladder:  Surgically absent  Common bile duct:  Diameter: 9 mm in diameter  Liver:  Mild increased echogenicity of the liver. A right hepatic lobe cyst measures 3.2 x 3.3 cm. Left hepatic lobe cyst measures 6 x 8 mm.  IVC:  No abnormality visualized.  Pancreas:  Not visualized due to bowel gas.  Spleen:  Size and appearance within normal limits. Measures 4.6 cm in length.  Right Kidney:  Length: 9.7 cm in length. There is a cyst with a septation in upper pole measures 3.5 x 3.3 cm. No hydronephrosis.  Left Kidney:  Length: 12 cm. A cyst in lower pole measures 7.3 x 7 cm. No  hydronephrosis.  Abdominal aorta:  No aneurysm visualized.  Measures up to 2.7 cm in diameter.  Other findings:  None.  IMPRESSION: 1. Surgically absent gallbladder. 2. Bilateral renal cysts.  No hydronephrosis. 3. No aortic aneurysm. 4. Right hepatic lobe cyst measures 3.3 cm. Left hepatic lobe cyst measures 8 mm.   Electronically Signed   By: Lahoma Crocker M.D.   On: 09/01/2014 08:40    Microbiology: Recent Results (from the past 240 hour(s))  URINE CULTURE     Status: None   Collection Time    08/31/14  3:40 PM      Result Value Ref Range Status   Colony Count NO GROWTH   Final   Organism ID, Bacteria NO GROWTH   Final     Labs: Basic Metabolic Panel:  Recent Labs Lab 08/31/14 1648 09/02/14 0552  NA 139 140  K 4.1 3.8  CL 104 107  CO2 22 22  GLUCOSE 118* 94  BUN 21 13  CREATININE 1.31 0.99  CALCIUM 9.2 8.7   Liver Function Tests: No results found for this basename: AST, ALT, ALKPHOS, BILITOT, PROT, ALBUMIN,  in the last 168 hours No results found for this basename: LIPASE, AMYLASE,  in the last 168 hours No results found for this basename: AMMONIA,  in the last 168 hours CBC:  Recent Labs Lab 08/31/14 1648  WBC 6.7  HGB 11.8*  HCT 35.4*  MCV 93.9  PLT 219   Cardiac Enzymes: No results found for this basename: CKTOTAL, CKMB, CKMBINDEX, TROPONINI,  in the last 168 hours BNP: BNP (last 3 results)  Recent Labs  04/15/14 1145 09/01/14 1425  PROBNP 183.0 470.8*   CBG:  Recent Labs Lab 09/01/14 1138 09/01/14 1713 09/01/14 2047 09/02/14 0754 09/02/14 1129  GLUCAP 90 155* 87 82 109*       Signed:  Daytona Retana K  [facility] 09/02/2014, 2:59 PM

## 2014-09-02 NOTE — Care Management Note (Signed)
CARE MANAGEMENT NOTE 09/02/2014  Patient:  Kenneth Potter, Kenneth Potter   Account Number:  [id number]  Date Initiated:  09/02/2014  Documentation initiated by:  Jania Steinke  Subjective/Objective Assessment:   CM following for progression and d/c planning     Action/Plan:   Met with pt and daughter re HH needs, pt active with [facility], all orders resumed.   Anticipated DC Date:  09/02/2014   Anticipated DC Plan:  [place]         Choice offered to / List presented to:          [facility] arranged  HH-1 RN  [place].   Status of service:  Completed, signed off Medicare Important Message given?  NA - LOS <3 / Initial given by admissions (If response is "NO", the following Medicare IM given date fields will be blank) Date Medicare IM given:   Medicare IM given by:   Date Additional Medicare IM given:   Additional Medicare IM given by:    Discharge Disposition:  [place]  Per UR Regulation:    If discussed at Long Length of Stay Meetings, dates discussed:    Comments:

## 2014-09-02 NOTE — Evaluation (Signed)
Physical Therapy Evaluation Patient Details Name: Kenneth Potter MRN: [id number] DOB: 31-Jul-1926 Today's Date: 09/02/2014   History of Present Illness  Pt. admitted 08/31/14 with poor po intake and diarrhea.  Cdiff culture pending as of MD note 09/01/14.  History significant for DM,chronic diastolic heart failure, bradycardia, protein claorie malnutrition and dysphagia.Pt. recetnly Dc 'd from SNF folowing rehab for cholecystectomy 7/15.  Clinical Impression  Pt. Presents to PT with some gait instability and need for RW for decreasing risk of falls in the home environment. Per his daughter who is in the room with him currently, he will not be alone as there are 11 children.  Pt. Lives with a daughter who  works but family will be available 24/7.  I recommend HHPT f/u as pt. recently DC'd from SNF and not at mod I level of ambulation.  He has DC orders,  and family awaits DC home in the next short while, therefore I will sign off.      Follow Up Recommendations Home health PT;Supervision/Assistance - 24 hour;Supervision for mobility/OOB    Equipment Recommendations  None recommended by PT    Recommendations for Other Services       Precautions / Restrictions Precautions Precautions: Fall Restrictions Weight Bearing Restrictions: No      Mobility  Bed Mobility Overal bed mobility:  (not tested; pt. seated in recliner chair)                Transfers Overall transfer level: Needs assistance Equipment used: None Transfers: Sit to/from Stand Sit to Stand: Min guard         General transfer comment: min gurad assist needed for safety and stability; pt. able to power up with use of UEs  Ambulation/Gait Ambulation/Gait assistance: Min guard;Min assist Ambulation Distance (Feet): 150 Feet Assistive device: None;Rolling walker (2 wheeled) Gait Pattern/deviations: Step-through pattern;Decreased stride length;Trunk flexed;Narrow base of support Gait velocity: decreased Gait velocity  interpretation: Below normal speed for age/gender General Gait Details: pt.with tendancey to keep RW too far forward, needed safety and technique cueing.  Attmepted ambulation without devide but pt. with increased unsteadiness and increased sway.  More stable with RW.  Advised pt  and daughter that he currrently needs to use RW for decreasing risk of fall .  they are in agreement  Stairs            Wheelchair Mobility    Modified Rankin (Stroke Patients Only)       Balance Overall balance assessment: Needs assistance Sitting-balance support: No upper extremity supported;Feet supported Sitting balance-Leahy Scale: Good     Standing balance support: No upper extremity supported;During functional activity Standing balance-Leahy Scale: Fair Standing balance comment: stable with static standing; needs support for mobility in upright                             Pertinent Vitals/Pain Pain Assessment: No/denies pain    Home Living Family/patient expects to be discharged to:: Private residence Living Arrangements: Children Available Help at Discharge: Available 24 hours/day;Family Type of Home: House Home Access: Stairs to enter Entrance Stairs-Rails: Psychiatric nurse of Steps: 4 Home Layout: One level Home Equipment: Clinical cytogeneticist - 2 wheels;Bedside commode Additional Comments: Daughter from [place] presetn and providing most of info. today.  She states pt. lives with a local daughter who works but that there are 11 children and pt. will not be left alone.  I educaed her that pt. needs  24 hour assist at thiis time due to deconditioning from past several months of medical issues.    Prior Function Level of Independence: Needs assistance   Gait / Transfers Assistance Needed: Pt was (I) with bed mobility skills, transfers, and ambulation skills  ADL's / Homemaking Assistance Needed: Pt required assist from family for bathing, dressing, and  cooking        Hand Dominance        Extremity/Trunk Assessment   Upper Extremity Assessment: Generalized weakness           Lower Extremity Assessment: Generalized weakness         Communication   Communication: No difficulties  Cognition Arousal/Alertness: Awake/alert Behavior During Therapy: WFL for tasks assessed/performed Overall Cognitive Status: Within Functional Limits for tasks assessed                      General Comments      Exercises        Assessment/Plan    PT Assessment All further PT needs can be met in the next venue of care  PT Diagnosis Difficulty walking;Generalized weakness   PT Problem List Decreased strength;Decreased activity tolerance;Decreased balance;Decreased mobility;Decreased knowledge of use of DME;Decreased knowledge of precautions  PT Treatment Interventions     PT Goals (Current goals can be found in the Care Plan section) Acute Rehab PT Goals PT Goal Formulation: No goals set, d/c therapy    Frequency     Barriers to discharge        Co-evaluation               End of Session Equipment Utilized During Treatment: Gait belt Activity Tolerance: Patient tolerated treatment well Patient left: in chair;with call bell/phone within reach;with family/visitor present (daughter) Nurse Communication: Mobility status    Functional Assessment Tool Used: clinical observation and judgement Functional Limitation: Mobility: Walking and moving around Mobility: Walking and Moving Around Current Status ([id number]): At least 20 percent but less than 40 percent impaired, limited or restricted Mobility: Walking and Moving Around Goal Status [phone]): At least 20 percent but less than 40 percent impaired, limited or restricted Mobility: Walking and Moving Around Discharge Status [phone]): At least 20 percent but less than 40 percent impaired, limited or restricted    Time: 1124-1149 PT Time Calculation (min): 25  min   Charges:   PT Evaluation $Initial PT Evaluation Tier I: 1 Procedure PT Treatments $Gait Training: 8-22 mins   PT G Codes:   Functional Assessment Tool Used: clinical observation and judgement Functional Limitation: Mobility: Walking and moving around    [place] 09/02/2014, 12:08 PM Gerlean Ren PT Acute Rehab Services [phone] [place] [phone]

## 2014-09-04 NOTE — Telephone Encounter (Signed)
Patient was seen in office on 08/31/2014.

## 2014-09-09 NOTE — ED Provider Notes (Signed)
Medical screening examination/treatment/procedure(s) were conducted as a shared visit with non-physician practitioner(s) and myself.  I personally evaluated the patient during the encounter.   EKG Interpretation   Date/Time:  Tuesday August 31 2014 17:29:17 EDT Ventricular Rate:  53 PR Interval:  139 QRS Duration: 70 QT Interval:  417 QTC Calculation: 391 R Axis:   44 Text Interpretation:  Sinus rhythm Ventricular premature complex Abnormal  R-wave progression, early transition Minimal ST elevation, inferior leads  Baseline wander in lead(s) V5 V6 ED PHYSICIAN INTERPRETATION AVAILABLE IN  [facility] Confirmed by TEST, Record ([id number]) on 09/02/2014 7:08:50 AM     80 year old male with diarrhea and generalized weakness for the past 3 days. 5 pound weight loss. No fevers or chills. Denies any abdominal pain, but does endorse some dysuria. Patient was seen by her PCP just prior to arrival referred to the emergency room for possible dehydration and urinary tract infection. Exam, he is in no acute distress. Lungs are clear. Heart is mildly bradycardic without appreciable murmur. Abdomen is soft, nontender and nondistended. Concern with patient's age, ongoing diarrhea, poor by mouth intake that he would continue to decline and discharge. Recent hospitalization/rehabilitation potential he excision to C. difficile. Stool sample reportedly obtained PCPs office. We'll admit for further hydration, evaluation.  Virgel Manifold, MD 09/09/14 0630

## 2014-09-13 ENCOUNTER — Telehealth: Payer: Self-pay | Admitting: *Deleted

## 2014-09-13 DIAGNOSIS — E1129 Type 2 diabetes mellitus with other diabetic kidney complication: Secondary | ICD-10-CM

## 2014-09-13 DIAGNOSIS — I5032 Chronic diastolic (congestive) heart failure: Secondary | ICD-10-CM

## 2014-09-13 DIAGNOSIS — I10 Essential (primary) hypertension: Secondary | ICD-10-CM

## 2014-09-13 NOTE — Telephone Encounter (Signed)
Message copied by Sheral Flow on Mon Sep 13, 2014  2:31 PM ------      Message from: [name] F      Created: Mon Sep 13, 2014  1:55 PM      Regarding: FW: F/U family, needs PCS or nursing services       Call family and check on pt, send referral to [facility] to see if he qualifies for BP monitoring, medication management, SW services for family to assist with placement or home services                              ----- Message -----         From: Alycia Rossetti, MD         Sent: 09/10/2014   5:10 PM           To: Alycia Rossetti, MD      Subject: F/U family, needs PCS or nursing services                       ------

## 2014-09-13 NOTE — Telephone Encounter (Signed)
Call placed to patient. No answer, no VM.  Orders for [facility] placed.

## 2014-09-14 NOTE — Telephone Encounter (Signed)
Received return call from [place], patient daughter.   [name] made aware of [facility] orders.   Requested PCS forms to be completed. Forms to be completed and given to MD for signature.

## 2014-09-15 NOTE — Telephone Encounter (Signed)
Forms completed and faxed to [facility].   Mailed copy to patient daughter.

## 2014-09-16 ENCOUNTER — Telehealth: Payer: Self-pay | Admitting: Family Medicine

## 2014-09-16 NOTE — Telephone Encounter (Signed)
LM for pt to schedule [facility] CPE

## 2014-10-05 ENCOUNTER — Telehealth: Payer: Self-pay | Admitting: Family Medicine

## 2014-10-05 ENCOUNTER — Encounter: Payer: Self-pay | Admitting: Family Medicine

## 2014-10-05 NOTE — Telephone Encounter (Signed)
Letter is sent to pt to call and schedule [facility] CPE

## 2014-10-06 ENCOUNTER — Telehealth: Payer: Self-pay | Admitting: *Deleted

## 2014-10-06 NOTE — Telephone Encounter (Signed)
Call placed to patient family. Kenneth Potter, patient daughter contacted.   Advised to hold Norvasc and Lisinopril until further notice.   Appointment scheduled for Friday, 10/08/2014 with MD.   [name] to continue to push fluids including ice cream, popscicles, puddings and jello.

## 2014-10-06 NOTE — Telephone Encounter (Signed)
noted 

## 2014-10-06 NOTE — Telephone Encounter (Signed)
Received call from Jacqlyn Larsen, [facility] nurse.   Reports that she went out to see patient on 10/06/2014.  States that patient BP was slightly decreased at 82/54 in L arm, and 88/56 in R arm. States that patient is dehydrated, but his family is aware to push fluids.   States that patient had fall on 10/05/2014. Reports that patient states he was dizzy and Almyra Free believes that this may be d/t hypotension.   Also reports that she spoke with patient about possibly adding anti-depressant. States that she performed a depression screen and noted that he had moderate to severe readings. Reports that patient voices increased fatigue and sleeps most of the day and night.   MD to be made aware.

## 2014-10-06 NOTE — Telephone Encounter (Signed)
Received call from Duwayne Heck, [place] with [facility].   Reports that patient was under [facility] care for [facility] SN, PT, OT, ST, and [place] Aide services.   Reports that order to hold services was given when patient left town for vacation with family in [place].   Reports that patient is back in [place] and requested resumption of care orders.   VO given to resume HH SN, PT, OT, ST and [facility] Aide services with prior goals.  MD to be made aware.

## 2014-10-06 NOTE — Telephone Encounter (Signed)
Make sure pt holds all his blood pressure medications See if they can bring him in this Friday if not Monday Push fluids I will not start depression medication just yet, I want his BP to get better first

## 2014-10-08 ENCOUNTER — Ambulatory Visit (INDEPENDENT_AMBULATORY_CARE_PROVIDER_SITE_OTHER): Payer: Medicare Other | Admitting: Family Medicine

## 2014-10-08 VITALS — BP 128/80 | HR 68 | Temp 97.8°F | Resp 14 | Ht 61.0 in | Wt 128.0 lb

## 2014-10-08 DIAGNOSIS — N058 Unspecified nephritic syndrome with other morphologic changes: Secondary | ICD-10-CM

## 2014-10-08 DIAGNOSIS — K5901 Slow transit constipation: Secondary | ICD-10-CM

## 2014-10-08 DIAGNOSIS — I959 Hypotension, unspecified: Secondary | ICD-10-CM

## 2014-10-08 DIAGNOSIS — I952 Hypotension due to drugs: Secondary | ICD-10-CM

## 2014-10-08 DIAGNOSIS — F0390 Unspecified dementia without behavioral disturbance: Secondary | ICD-10-CM

## 2014-10-08 DIAGNOSIS — Z8546 Personal history of malignant neoplasm of prostate: Secondary | ICD-10-CM

## 2014-10-08 DIAGNOSIS — E1129 Type 2 diabetes mellitus with other diabetic kidney complication: Secondary | ICD-10-CM

## 2014-10-08 DIAGNOSIS — E43 Unspecified severe protein-calorie malnutrition: Secondary | ICD-10-CM

## 2014-10-08 DIAGNOSIS — K5900 Constipation, unspecified: Secondary | ICD-10-CM | POA: Insufficient documentation

## 2014-10-08 DIAGNOSIS — Z23 Encounter for immunization: Secondary | ICD-10-CM

## 2014-10-08 MED ORDER — POLYETHYLENE GLYCOL 3350 17 GM/SCOOP PO POWD: 17.0000 g | Freq: Every day | ORAL | Status: DC

## 2014-10-08 MED ORDER — DONEPEZIL HCL 5 MG PO TABS: 5.0000 mg | ORAL_TABLET | Freq: Every day | ORAL | Status: DC

## 2014-10-08 NOTE — Patient Instructions (Signed)
Stop the [place] Give miralax once a day Aricept at bedtime We will call with lab results F/U 2 months

## 2014-10-10 ENCOUNTER — Encounter: Payer: Self-pay | Admitting: Family Medicine

## 2014-10-10 DIAGNOSIS — I959 Hypotension, unspecified: Secondary | ICD-10-CM | POA: Insufficient documentation

## 2014-10-10 NOTE — Assessment & Plan Note (Signed)
Hold Januvia, see how is appeite and fluid intake improves

## 2014-10-10 NOTE — Progress Notes (Signed)
Patient ID: Kenneth Potter, male   DOB: September 04, 1926, 80 y.o.   MRN: [id number]   Subjective:    Patient ID: Kenneth Potter, male    DOB: [birth year], 80 y.o.   MRN: [id number]  Patient presents for Hypotension Pt here for f/u, was admitted last visit for hypotension, FTT, now back at home with family, they continue to have difficulty caring for him as most work. Someone is with him around the clock. His appetite is improving some, but he is not drinking well. [facility] is also on board, he has been having low BP recently 88/58-106/58, he stopped BP meds yesterday as I instructed. Denis CP, SOB.  He is not sleeping well per some of the children, his daugter [name] however who oversees him at night states he has has slept okay past few nights, he tends to sleep during day then does not want to get up. They are also concerned his memory is worsening, very foregetal or does not understand what they are telling him, [facility] depression screen also positive. He has days and nights mixed up as well. Family feels he is much worse than a year ago.   DM- last A1C 6.2% januvia was stopped during admission but family resumed.   Constipation- at times they have to manually disimpact, suppository has not helped as much.  Request a repeat PSA due to prostate cancer history   Review Of Systems:  GEN- denies fatigue, fever, weight loss,weakness, recent illness HEENT- denies eye drainage, change in vision, nasal discharge, CVS- denies chest pain, palpitations RESP- denies SOB, cough, wheeze ABD- denies N/V, change in stools, abd pain GU- denies dysuria, hematuria, dribbling, incontinence MSK- denies joint pain, muscle aches, injury Neuro- denies headache, dizziness, syncope, seizure activity       Objective:    BP 128/80  Pulse 68  Temp(Src) 97.8 F (36.6 C) (Oral)  Resp 14  Ht 5\' 1"  (1.549 m)  Wt 128 lb (58.06 kg)  BMI 24.20 kg/m2 GEN- NAD, alert and oriented x2 HEENT- PERRL, EOMI, non injected sclera, pink  conjunctiva, MMM, oropharynx clear Neck- Supple, no LAD CVS- RR, mild bradycardia, no murmur RESP-CTAB ABD-NABS,soft,NT,ND EXT- No edema Psych- quite, demeanor unchanged from previous visits, not depressed appearing,not anxious Pulses- Radial 2+        Assessment & Plan:      Problem List Items Addressed This Visit   Protein-calorie malnutrition, severe (Chronic)   Relevant Orders      CBC with Differential      Comprehensive metabolic panel   History of prostate cancer   Relevant Orders      PSA, Medicare   Constipation    Other Visit Diagnoses   Hypotension, unspecified hypotension type    -  Primary    Relevant Orders       CBC with Differential       Comprehensive metabolic panel    Need for prophylactic vaccination and inoculation against influenza        Relevant Orders       Flu Vaccine QUAD 36+ mos PF IM (Fluarix Quad PF) (Completed)       Note: This dictation was prepared with Dragon dictation along with smaller phrase technology. Any transcriptional errors that result from this process are unintentional.

## 2014-10-10 NOTE — Assessment & Plan Note (Signed)
His cognition  has declined since his past 2 hospitalizations, I think there may be underlying depression as well as his health continues to decline. After discussion with family they would like to try the Aricept, will start 5mg  at bedtime

## 2014-10-10 NOTE — Assessment & Plan Note (Signed)
miralax once a day

## 2014-10-10 NOTE — Assessment & Plan Note (Signed)
2 pound weight loss since last visit in Sept, which is actually is not too bad despite hosptilization ensure

## 2014-10-10 NOTE — Assessment & Plan Note (Signed)
D/c BP meds, note pt was not given hydralazine after discharge from hospital BP already improving Increase fluids

## 2014-10-13 LAB — CBC WITH DIFFERENTIAL/PLATELET
Basophils Absolute: 0.1 10*3/uL (ref 0.0–0.1)
Basophils Relative: 1 % (ref 0–1)
Eosinophils Absolute: 0.1 10*3/uL (ref 0.0–0.7)
Eosinophils Relative: 2 % (ref 0–5)
HCT: 39.3 % (ref 39.0–52.0)
Hemoglobin: 13.4 g/dL (ref 13.0–17.0)
LYMPHS ABS: 2.4 10*3/uL (ref 0.7–4.0)
LYMPHS PCT: 42 % (ref 12–46)
MCH: 31.5 pg (ref 26.0–34.0)
MCHC: 34.1 g/dL (ref 30.0–36.0)
MCV: 92.3 fL (ref 78.0–100.0)
Monocytes Absolute: 0.5 10*3/uL (ref 0.1–1.0)
Monocytes Relative: 8 % (ref 3–12)
NEUTROS ABS: 2.7 10*3/uL (ref 1.7–7.7)
Neutrophils Relative %: 47 % (ref 43–77)
PLATELETS: 205 10*3/uL (ref 150–400)
RBC: 4.26 MIL/uL (ref 4.22–5.81)
RDW: 15.6 % — AB (ref 11.5–15.5)
WBC: 5.7 10*3/uL (ref 4.0–10.5)

## 2014-10-13 LAB — COMPREHENSIVE METABOLIC PANEL
ALBUMIN: 3.5 g/dL (ref 3.5–5.2)
ALT: 10 U/L (ref 0–53)
AST: 15 U/L (ref 0–37)
Alkaline Phosphatase: 71 U/L (ref 39–117)
BUN: 12 mg/dL (ref 6–23)
CALCIUM: 9.2 mg/dL (ref 8.4–10.5)
CHLORIDE: 106 meq/L (ref 96–112)
CO2: 27 mEq/L (ref 19–32)
Creat: 1.08 mg/dL (ref 0.50–1.35)
Glucose, Bld: 144 mg/dL — ABNORMAL HIGH (ref 70–99)
POTASSIUM: 3.9 meq/L (ref 3.5–5.3)
Sodium: 142 mEq/L (ref 135–145)
Total Bilirubin: 0.5 mg/dL (ref 0.2–1.2)
Total Protein: 6.5 g/dL (ref 6.0–8.3)

## 2014-10-14 LAB — PSA, MEDICARE: PSA: 1.54 ng/mL (ref <=4.00)

## 2014-10-15 ENCOUNTER — Telehealth: Payer: Self-pay | Admitting: Family Medicine

## 2014-10-15 NOTE — Telephone Encounter (Signed)
Patients daughter [name] is calling to see if his results were in and also has some questions about his last visit  [phone]

## 2014-10-15 NOTE — Telephone Encounter (Signed)
I left VM with pt daughter regarding labs and meds See lab results

## 2014-10-15 NOTE — Telephone Encounter (Signed)
Call placed to [facility], patient daughter.   Reports that [facility] has not returned to home care since patient returned from [place].   Advised to contact [facility] and inquire.   Also advised that patient labs have returned, but MD has not reviewed at this time.   Requested MD to speak with patient POA, De Blanch in regards to Aricept.  [phone].  MD to be made aware.

## 2014-10-19 ENCOUNTER — Telehealth: Payer: Self-pay | Admitting: Family Medicine

## 2014-10-19 NOTE — Telephone Encounter (Signed)
I called and spoke pt daughter [name] who is HPOA There seems to be some disconnect between her the rest of the family regarding the care of her father. He was seen at our last visit with hypertension he also was not eating and drinking very well I discontinued his blood pressure medicines as well as his blood sugar meds. She is okay with these changes however states that when she stays with him and another one of her sisters they do not have any difficulties getting him to eat as long as she tell him when it is time to eat and not give him the choices. She's also concerned about the Aricept she understands that his memory is worsening in her changes with his cognition as well as days and nights and following instructions which is becoming frustrated to the family but she is not sure she will submit him on the medication. I discussed the medication as well as side effects at this time we will just hold off on the Aricept as I was quite late in with her that it is not coming cause this significant change overnight and his cognitive status will actually help to slow the changes that we see now. Regarding placement they want to keep her father at home and there are in agreement to having some services or the day if he qualifies for help. I will continue having [facility] seeing pt

## 2014-11-29 ENCOUNTER — Other Ambulatory Visit: Payer: Self-pay | Admitting: Family Medicine

## 2014-11-30 ENCOUNTER — Other Ambulatory Visit: Payer: Self-pay | Admitting: *Deleted

## 2014-11-30 MED ORDER — SIMVASTATIN 20 MG PO TABS: 20.0000 mg | ORAL_TABLET | Freq: Every day | ORAL | Status: DC

## 2014-11-30 NOTE — Telephone Encounter (Signed)
Refill appropriate and filled per protocol. 

## 2014-11-30 NOTE — Telephone Encounter (Signed)
Received call from patient daughter.   Requested MD to advise if patient should continue Zocor. MD advised that medication is more preventative and if family wants to stop it they can, but she would prefer to continue it.   Patient daughter made aware and requested 90 day supply of Zocor.   Prescription sent to pharmacy.

## 2014-12-01 ENCOUNTER — Telehealth: Payer: Self-pay | Admitting: *Deleted

## 2014-12-01 DIAGNOSIS — Z789 Other specified health status: Secondary | ICD-10-CM

## 2014-12-01 DIAGNOSIS — R269 Unspecified abnormalities of gait and mobility: Secondary | ICD-10-CM

## 2014-12-01 NOTE — Telephone Encounter (Signed)
Pt daughter in law called wanting to know if we can get pt into a [place] to help with his ADL's , has trouble going to bathrooms without assistance, gait problems, states needs to have some one to be with him all the time.   Pt call back Evette DIL [phone] or Kayl Stogdill) [phone]

## 2014-12-01 NOTE — Telephone Encounter (Signed)
We have tried this before, last in Sept, forms were sent to [facility], it was my understanding that he did not qualify based on insurance, we also had [place] with [facility] working on this, you can call and get an update from him, but I am sure his insurance did not cover

## 2014-12-02 NOTE — Telephone Encounter (Signed)
Pt family called back and wants to go ahead with trying to set up a [place] aide to come to home and help with ADL. Putting in referral to [facility] at pt family request.

## 2014-12-02 NOTE — Telephone Encounter (Signed)
lmtrc

## 2014-12-09 ENCOUNTER — Encounter (HOSPITAL_COMMUNITY): Payer: Self-pay | Admitting: Cardiovascular Disease

## 2015-01-10 ENCOUNTER — Telehealth: Payer: Self-pay | Admitting: Family Medicine

## 2015-01-10 NOTE — Telephone Encounter (Signed)
Please contact family to make them aware.

## 2015-01-10 NOTE — Telephone Encounter (Signed)
We have tried these forms multiple times with no success. Patient does not qualify.   What is going on with the [facility] referral to [facility]?

## 2015-01-10 NOTE — Telephone Encounter (Signed)
I Called [facility] and they are checking in on the referral, states remember seeing it come in but cant find it. I refaxed referral to [facility]

## 2015-01-10 NOTE — Telephone Encounter (Signed)
[phone] Pt daughter [name] states we were suppose to be filling out some papers for the pt to have some assistance (i believe she said [employer]) and they have not heard anything back about it

## 2015-01-10 NOTE — Telephone Encounter (Signed)
Kenneth Potter called back spoke with [name] and states that they will be doing PT and HH with pt starting tomorrow, and apologizes for not getting to them sooner.

## 2015-01-31 ENCOUNTER — Encounter: Payer: Self-pay | Admitting: Cardiology

## 2015-01-31 ENCOUNTER — Ambulatory Visit (INDEPENDENT_AMBULATORY_CARE_PROVIDER_SITE_OTHER): Payer: Self-pay | Admitting: Cardiology

## 2015-01-31 VITALS — BP 179/82 | HR 51 | Ht 64.0 in | Wt 131.0 lb

## 2015-01-31 DIAGNOSIS — I10 Essential (primary) hypertension: Secondary | ICD-10-CM

## 2015-01-31 DIAGNOSIS — R001 Bradycardia, unspecified: Secondary | ICD-10-CM

## 2015-01-31 DIAGNOSIS — I2510 Atherosclerotic heart disease of native coronary artery without angina pectoris: Secondary | ICD-10-CM

## 2015-01-31 NOTE — Patient Instructions (Signed)
Your physician wants you to follow-up in: 1 year with Dr. Branch  You will receive a reminder letter in the mail two months in advance. If you don't receive a letter, please call our office to schedule the follow-up appointment.  Your physician recommends that you continue on your current medications as directed. Please refer to the Current Medication list given to you today.  Thank you for choosing [facility]!!   
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
## 2015-02-08 ENCOUNTER — Emergency Department (HOSPITAL_COMMUNITY): Payer: Medicare Other

## 2015-02-08 ENCOUNTER — Encounter (HOSPITAL_COMMUNITY): Payer: Self-pay | Admitting: *Deleted

## 2015-02-08 ENCOUNTER — Ambulatory Visit: Payer: Self-pay | Admitting: Family Medicine

## 2015-02-08 ENCOUNTER — Inpatient Hospital Stay (HOSPITAL_COMMUNITY)
Admission: EM | Admit: 2015-02-08 | Discharge: 2015-02-10 | DRG: 193 | Disposition: A | Discharge status: Home / Self Care | Payer: Medicare Other | Attending: Internal Medicine | Admitting: Internal Medicine

## 2015-02-08 DIAGNOSIS — I10 Essential (primary) hypertension: Secondary | ICD-10-CM | POA: Diagnosis present

## 2015-02-08 DIAGNOSIS — F419 Anxiety disorder, unspecified: Secondary | ICD-10-CM | POA: Diagnosis present

## 2015-02-08 DIAGNOSIS — F17210 Nicotine dependence, cigarettes, uncomplicated: Secondary | ICD-10-CM | POA: Diagnosis present

## 2015-02-08 DIAGNOSIS — G9340 Encephalopathy, unspecified: Secondary | ICD-10-CM | POA: Diagnosis present

## 2015-02-08 DIAGNOSIS — E876 Hypokalemia: Secondary | ICD-10-CM | POA: Diagnosis present

## 2015-02-08 DIAGNOSIS — Z885 Allergy status to narcotic agent status: Secondary | ICD-10-CM | POA: Diagnosis not present

## 2015-02-08 DIAGNOSIS — G40909 Epilepsy, unspecified, not intractable, without status epilepticus: Secondary | ICD-10-CM | POA: Diagnosis present

## 2015-02-08 DIAGNOSIS — R4182 Altered mental status, unspecified: Secondary | ICD-10-CM

## 2015-02-08 DIAGNOSIS — E1129 Type 2 diabetes mellitus with other diabetic kidney complication: Secondary | ICD-10-CM | POA: Diagnosis present

## 2015-02-08 DIAGNOSIS — Z8546 Personal history of malignant neoplasm of prostate: Secondary | ICD-10-CM

## 2015-02-08 DIAGNOSIS — Z9049 Acquired absence of other specified parts of digestive tract: Secondary | ICD-10-CM | POA: Diagnosis present

## 2015-02-08 DIAGNOSIS — J189 Pneumonia, unspecified organism: Secondary | ICD-10-CM | POA: Diagnosis not present

## 2015-02-08 DIAGNOSIS — E782 Mixed hyperlipidemia: Secondary | ICD-10-CM | POA: Diagnosis present

## 2015-02-08 DIAGNOSIS — K219 Gastro-esophageal reflux disease without esophagitis: Secondary | ICD-10-CM | POA: Diagnosis present

## 2015-02-08 DIAGNOSIS — Z886 Allergy status to analgesic agent status: Secondary | ICD-10-CM | POA: Diagnosis not present

## 2015-02-08 DIAGNOSIS — R509 Fever, unspecified: Secondary | ICD-10-CM

## 2015-02-08 DIAGNOSIS — N058 Unspecified nephritic syndrome with other morphologic changes: Secondary | ICD-10-CM

## 2015-02-08 DIAGNOSIS — I2510 Atherosclerotic heart disease of native coronary artery without angina pectoris: Secondary | ICD-10-CM | POA: Diagnosis present

## 2015-02-08 LAB — GLUCOSE, CAPILLARY
Glucose-Capillary: 115 mg/dL — ABNORMAL HIGH (ref 70–99)
Glucose-Capillary: 117 mg/dL — ABNORMAL HIGH (ref 70–99)

## 2015-02-08 LAB — URINALYSIS, ROUTINE W REFLEX MICROSCOPIC
Bilirubin Urine: NEGATIVE
Glucose, UA: NEGATIVE mg/dL
KETONES UR: NEGATIVE mg/dL
Leukocytes, UA: NEGATIVE
NITRITE: NEGATIVE
Specific Gravity, Urine: 1.02 (ref 1.005–1.030)
Urobilinogen, UA: 1 mg/dL (ref 0.0–1.0)
pH: 6 (ref 5.0–8.0)

## 2015-02-08 LAB — CBC
HEMATOCRIT: 38.5 % — AB (ref 39.0–52.0)
HEMOGLOBIN: 13.5 g/dL (ref 13.0–17.0)
MCH: 33.3 pg (ref 26.0–34.0)
MCHC: 35.1 g/dL (ref 30.0–36.0)
MCV: 94.8 fL (ref 78.0–100.0)
Platelets: 103 10*3/uL — ABNORMAL LOW (ref 150–400)
RBC: 4.06 MIL/uL — AB (ref 4.22–5.81)
RDW: 13.1 % (ref 11.5–15.5)
WBC: 8.9 10*3/uL (ref 4.0–10.5)

## 2015-02-08 LAB — URINE MICROSCOPIC-ADD ON

## 2015-02-08 LAB — COMPREHENSIVE METABOLIC PANEL
ALT: 11 U/L (ref 0–53)
ANION GAP: 6 (ref 5–15)
AST: 22 U/L (ref 0–37)
Albumin: 3.6 g/dL (ref 3.5–5.2)
Alkaline Phosphatase: 70 U/L (ref 39–117)
BUN: 13 mg/dL (ref 6–23)
CO2: 23 mmol/L (ref 19–32)
Calcium: 8.6 mg/dL (ref 8.4–10.5)
Chloride: 109 mmol/L (ref 96–112)
Creatinine, Ser: 1.2 mg/dL (ref 0.50–1.35)
GFR calc non Af Amer: 53 mL/min — ABNORMAL LOW (ref >90)
GFR, EST AFRICAN AMERICAN: 61 mL/min — AB (ref >90)
Glucose, Bld: 171 mg/dL — ABNORMAL HIGH (ref 70–99)
Potassium: 3.2 mmol/L — ABNORMAL LOW (ref 3.5–5.1)
Sodium: 138 mmol/L (ref 135–145)
TOTAL PROTEIN: 6.6 g/dL (ref 6.0–8.3)
Total Bilirubin: 1 mg/dL (ref 0.3–1.2)

## 2015-02-08 LAB — TSH: TSH: 2.059 u[IU]/mL (ref 0.350–4.500)

## 2015-02-08 LAB — TROPONIN I: Troponin I: 0.07 ng/mL — ABNORMAL HIGH (ref <0.031)

## 2015-02-08 LAB — VITAMIN B12: Vitamin B-12: 295 pg/mL (ref 211–911)

## 2015-02-08 MED ORDER — INSULIN ASPART 100 UNIT/ML ~~LOC~~ SOLN
0.0000 [IU] | Freq: Three times a day (TID) | SUBCUTANEOUS | Status: DC
  Administered 2015-02-08 – 2015-02-09 (×4): 1 [IU] via SUBCUTANEOUS
  Administered 2015-02-10: 2 [IU] via SUBCUTANEOUS
  Administered 2015-02-10: 1 [IU] via SUBCUTANEOUS

## 2015-02-08 MED ORDER — NITROGLYCERIN 0.4 MG SL SUBL: 0.4000 mg | SUBLINGUAL_TABLET | SUBLINGUAL | Status: DC | PRN

## 2015-02-08 MED ORDER — DEXTROSE 5 % IV SOLN
1.0000 g | Freq: Once | INTRAVENOUS | Status: AC
  Administered 2015-02-08: 1 g via INTRAVENOUS
  Filled 2015-02-08: qty 10

## 2015-02-08 MED ORDER — GLUCERNA SHAKE PO LIQD
237.0000 mL | Freq: Three times a day (TID) | ORAL | Status: DC
  Administered 2015-02-08 – 2015-02-10 (×8): 237 mL via ORAL

## 2015-02-08 MED ORDER — SODIUM CHLORIDE 0.9 % IV SOLN: 250.0000 mL | INTRAVENOUS | Status: DC | PRN

## 2015-02-08 MED ORDER — POTASSIUM CHLORIDE CRYS ER 10 MEQ PO TBCR
10.0000 meq | EXTENDED_RELEASE_TABLET | Freq: Two times a day (BID) | ORAL | Status: AC
  Administered 2015-02-08 – 2015-02-09 (×4): 10 meq via ORAL
  Filled 2015-02-08 (×4): qty 1

## 2015-02-08 MED ORDER — BISACODYL 10 MG RE SUPP
10.0000 mg | Freq: Every day | RECTAL | Status: DC | PRN
  Filled 2015-02-08: qty 1

## 2015-02-08 MED ORDER — SODIUM CHLORIDE 0.9 % IV SOLN: INTRAVENOUS | Status: DC

## 2015-02-08 MED ORDER — SODIUM CHLORIDE 0.9 % IJ SOLN: 3.0000 mL | Freq: Two times a day (BID) | INTRAMUSCULAR | Status: DC

## 2015-02-08 MED ORDER — ACETAMINOPHEN 325 MG PO TABS: 650.0000 mg | ORAL_TABLET | Freq: Four times a day (QID) | ORAL | Status: DC | PRN

## 2015-02-08 MED ORDER — DEXTROSE 5 % IV SOLN: 500.0000 mg | INTRAVENOUS | Status: DC

## 2015-02-08 MED ORDER — ACETAMINOPHEN 650 MG RE SUPP
RECTAL | Status: AC
  Filled 2015-02-08: qty 1

## 2015-02-08 MED ORDER — ENOXAPARIN SODIUM 40 MG/0.4ML ~~LOC~~ SOLN
40.0000 mg | SUBCUTANEOUS | Status: DC
  Administered 2015-02-08 – 2015-02-10 (×3): 40 mg via SUBCUTANEOUS
  Filled 2015-02-08 (×3): qty 0.4

## 2015-02-08 MED ORDER — BENZONATATE 100 MG PO CAPS
100.0000 mg | ORAL_CAPSULE | Freq: Three times a day (TID) | ORAL | Status: DC
  Administered 2015-02-08 – 2015-02-10 (×7): 100 mg via ORAL
  Filled 2015-02-08 (×7): qty 1

## 2015-02-08 MED ORDER — DEXTROSE 5 % IV SOLN
500.0000 mg | INTRAVENOUS | Status: DC
  Administered 2015-02-09 – 2015-02-10 (×2): 500 mg via INTRAVENOUS
  Filled 2015-02-08 (×3): qty 500

## 2015-02-08 MED ORDER — ACETAMINOPHEN 650 MG RE SUPP: 650.0000 mg | Freq: Four times a day (QID) | RECTAL | Status: DC | PRN

## 2015-02-08 MED ORDER — INSULIN ASPART 100 UNIT/ML ~~LOC~~ SOLN: 0.0000 [IU] | Freq: Every day | SUBCUTANEOUS | Status: DC

## 2015-02-08 MED ORDER — DONEPEZIL HCL 5 MG PO TABS
5.0000 mg | ORAL_TABLET | Freq: Every day | ORAL | Status: DC
  Administered 2015-02-08 – 2015-02-09 (×2): 5 mg via ORAL
  Filled 2015-02-08 (×2): qty 1

## 2015-02-08 MED ORDER — PANTOPRAZOLE SODIUM 40 MG PO TBEC
40.0000 mg | DELAYED_RELEASE_TABLET | Freq: Every day | ORAL | Status: DC
  Administered 2015-02-08 – 2015-02-10 (×3): 40 mg via ORAL
  Filled 2015-02-08 (×3): qty 1

## 2015-02-08 MED ORDER — CEFTRIAXONE SODIUM IN DEXTROSE 20 MG/ML IV SOLN
1.0000 g | INTRAVENOUS | Status: DC
  Administered 2015-02-09 – 2015-02-10 (×2): 1 g via INTRAVENOUS
  Filled 2015-02-08 (×3): qty 50

## 2015-02-08 MED ORDER — SODIUM CHLORIDE 0.9 % IJ SOLN: 3.0000 mL | INTRAMUSCULAR | Status: DC | PRN

## 2015-02-08 MED ORDER — SODIUM CHLORIDE 0.9 % IV BOLUS (SEPSIS)
1000.0000 mL | Freq: Once | INTRAVENOUS | Status: AC
  Administered 2015-02-08: 1000 mL via INTRAVENOUS

## 2015-02-08 MED ORDER — POTASSIUM CHLORIDE IN NACL 40-0.9 MEQ/L-% IV SOLN
INTRAVENOUS | Status: AC
  Administered 2015-02-08: 75 mL/h via INTRAVENOUS

## 2015-02-08 MED ORDER — POLYETHYLENE GLYCOL 3350 17 GM/SCOOP PO POWD
17.0000 g | Freq: Every day | ORAL | Status: DC
  Administered 2015-02-08 – 2015-02-10 (×3): 17 g via ORAL
  Filled 2015-02-08: qty 255

## 2015-02-08 MED ORDER — DEXTROSE 5 % IV SOLN
500.0000 mg | INTRAVENOUS | Status: DC
  Administered 2015-02-08: 500 mg via INTRAVENOUS
  Filled 2015-02-08: qty 500

## 2015-02-08 NOTE — Progress Notes (Signed)
The patient is an 81 year old man with history of diet-controlled diabetes mellitus, CAD, hypertension, dementia, who was admitted by Dr. Maudie Mercury this morning for pneumonia. He was briefly seen and examined. His chart, vital signs, laboratory studies were reviewed. Management plan was discussed with his daughter [name]. Agree with treatment started with additions below:  1. Will start him on a dysphagia diet as he has had no nausea or vomiting; no teeth. 2. Will gently supplement his potassium orally and continue passing and IV fluids. Continue to monitor his serum potassium. 3. Will start sliding scale NovoLog. Will check a hemoglobin A1c. 4. Will add Tessalon Perles for cough. 5. For evaluation of thrombocytopenia, will order a TSH and vitamin B12 level.
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
## 2015-02-08 NOTE — Evaluation (Signed)
Physical Therapy Evaluation Patient Details Name: Kenneth Potter MRN: [id number] DOB: 1926/12/18 Today's Date: 02/08/2015   History of Present Illness  The patient is an 81 year old man with history of diet-controlled diabetes mellitus, CAD, hypertension, dementia, who was admitted by Dr. Maudie Mercury this morning for pneumonia.    Clinical Impression  Kenneth Potter, 81 year old pleasant male, was seen in bed, awake, alert  and slightly confused. Able to follow directions. Problem List: generalized weakness and deconditioned, deficit in balance, and abnormal gait. Current functional status: Bed mobility Independent, Transfers Min [employer], Gait Min Guard assit with Rolling walker (2- Wheeled) for 15 feet.. Per daughter's report, Patient is currently receiving HHPT at home. Recommendation for Discharge and was discussed with patient/family is to return home with HHPT.    Follow Up Recommendations Home health PT (Daughter reports that patient is currently receiving HHPT and will continue HHPT upon Discharge)    Equipment Recommendations  none    Recommendations for Other Services OT consult;Speech consult     Precautions / Restrictions Precautions Precautions: Fall Restrictions Weight Bearing Restrictions: No      Mobility  Bed Mobility Overal bed mobility: Modified Independent   Transfers Overall transfer level: Needs assistance Equipment used: Rolling walker (2 wheeled) Transfers: Sit to/from Stand Sit to Stand: Min guard     Ambulation/Gait Ambulation/Gait assistance: Min guard Ambulation Distance (Feet): 15 Feet Assistive device: Rolling walker (2 wheeled) Gait Pattern/deviations: Shuffle;Narrow base of support;Trunk flexed;Step-to pattern   Gait velocity interpretation: Below normal speed for age/gender   Balance Overall balance assessment: Needs assistance Sitting-balance support: Feet supported;Bilateral upper extremity supported Sitting balance-Leahy Scale: Good     Standing balance support: Bilateral upper extremity supported Standing balance-Leahy Scale: Fair       Pertinent Vitals/Pain Pain Assessment: No/denies pain    Home Living Family/patient expects to be discharged to:: Private residence Living Arrangements: Children Available Help at Discharge: Available 24 hours/day;Family Type of Home: House Home Access: Stairs to enter Entrance Stairs-Rails: Left Entrance Stairs-Number of Steps: 4 Home Layout: One level Home Equipment: Walker - 2 wheels;Walker - 4 wheels;Cane - quad;Shower seat;Tub bench;Toilet riser;Grab bars - tub/shower;Bedside commode      Prior Function Level of Independence: Needs assistance   Gait / Transfers Assistance Needed: Pt was (I) with bed mobility skills, transfers, and ambulation skills  ADL's / Homemaking Assistance Needed: Pt required assist from family for bathing, dressing, and cooking       Hand Dominance   Dominant Hand: Right    Extremity/Trunk Assessment   Upper Extremity Assessment: Defer to OT evaluation  Lower Extremity Assessment: Generalized weakness   Cervical / Trunk Assessment: Kyphotic    Communication   Communication: No difficulties  Cognition Arousal/Alertness: Awake/alert Behavior During Therapy: WFL for tasks assessed/performed Overall Cognitive Status: History of cognitive impairments - at baseline     Assessment/Plan    PT Assessment Patient needs continued PT services  PT Diagnosis Abnormality of gait;Generalized weakness   PT Problem List Decreased strength;Decreased activity tolerance;Decreased balance;Decreased mobility  PT Treatment Interventions Gait training;Therapeutic activities;Therapeutic exercise;Balance training;Functional mobility training;Patient/family education   PT Goals (Current goals can be found in the Care Plan section) Acute Rehab PT Goals Patient Stated Goal: To go home PT Goal Formulation: With patient/family Time For Goal Achievement:  02/22/15 Potential to Achieve Goals: Good    Frequency Min 2X/week   Barriers to discharge   none       End of Session Equipment Utilized During Treatment:  Gait belt Activity Tolerance: Patient tolerated treatment well Patient left: in bed;with family/visitor present;with call bell/phone within reach           Time: 6834-1962 PT Time Calculation (min) (ACUTE ONLY): 22 min   Charges:   PT Evaluation $Initial PT Evaluation Tier I: 1 Procedure     Therron Sells A 02/08/2015, 3:49 PM

## 2015-02-08 NOTE — ED Notes (Signed)
Pt brought in by [facility] for c/o fever and altered loc;

## 2015-02-09 DIAGNOSIS — K219 Gastro-esophageal reflux disease without esophagitis: Secondary | ICD-10-CM

## 2015-02-09 DIAGNOSIS — E876 Hypokalemia: Secondary | ICD-10-CM

## 2015-02-09 LAB — GLUCOSE, CAPILLARY
GLUCOSE-CAPILLARY: 148 mg/dL — AB (ref 70–99)
GLUCOSE-CAPILLARY: 146 mg/dL — AB (ref 70–99)
GLUCOSE-CAPILLARY: 110 mg/dL — AB (ref 70–99)
Glucose-Capillary: 131 mg/dL — ABNORMAL HIGH (ref 70–99)
Glucose-Capillary: 122 mg/dL — ABNORMAL HIGH (ref 70–99)
Glucose-Capillary: 136 mg/dL — ABNORMAL HIGH (ref 70–99)

## 2015-02-09 LAB — CBC
HEMATOCRIT: 34.7 % — AB (ref 39.0–52.0)
HEMOGLOBIN: 11.9 g/dL — AB (ref 13.0–17.0)
MCH: 33 pg (ref 26.0–34.0)
MCHC: 34.3 g/dL (ref 30.0–36.0)
MCV: 96.1 fL (ref 78.0–100.0)
Platelets: 146 10*3/uL — ABNORMAL LOW (ref 150–400)
RBC: 3.61 MIL/uL — AB (ref 4.22–5.81)
RDW: 13.6 % (ref 11.5–15.5)
WBC: 14.2 10*3/uL — ABNORMAL HIGH (ref 4.0–10.5)

## 2015-02-09 LAB — BASIC METABOLIC PANEL
Anion gap: 6 (ref 5–15)
BUN: 15 mg/dL (ref 6–23)
CHLORIDE: 112 mmol/L (ref 96–112)
CO2: 23 mmol/L (ref 19–32)
CREATININE: 1.14 mg/dL (ref 0.50–1.35)
Calcium: 8.4 mg/dL (ref 8.4–10.5)
GFR calc Af Amer: 65 mL/min — ABNORMAL LOW (ref >90)
GFR calc non Af Amer: 56 mL/min — ABNORMAL LOW (ref >90)
Glucose, Bld: 137 mg/dL — ABNORMAL HIGH (ref 70–99)
Potassium: 3.8 mmol/L (ref 3.5–5.1)
Sodium: 141 mmol/L (ref 135–145)

## 2015-02-09 LAB — HEMOGLOBIN A1C
HEMOGLOBIN A1C: 6.6 % — AB (ref 4.8–5.6)
Mean Plasma Glucose: 143 mg/dL

## 2015-02-09 LAB — URINE CULTURE
Colony Count: NO GROWTH
Culture: NO GROWTH

## 2015-02-09 MED ORDER — DEXTROSE 5 % IV SOLN
INTRAVENOUS | Status: AC
  Filled 2015-02-09: qty 500

## 2015-02-09 MED ORDER — GUAIFENESIN ER 600 MG PO TB12
1200.0000 mg | ORAL_TABLET | Freq: Two times a day (BID) | ORAL | Status: DC
  Administered 2015-02-09 – 2015-02-10 (×2): 1200 mg via ORAL
  Filled 2015-02-09 (×2): qty 2

## 2015-02-09 MED ORDER — HYDRALAZINE HCL 20 MG/ML IJ SOLN
10.0000 mg | Freq: Once | INTRAMUSCULAR | Status: AC
  Administered 2015-02-09: 10 mg via INTRAVENOUS
  Filled 2015-02-09: qty 1

## 2015-02-09 MED ORDER — CEFTRIAXONE SODIUM 1 G IJ SOLR
INTRAMUSCULAR | Status: AC
  Filled 2015-02-09: qty 10

## 2015-02-09 MED ORDER — AMLODIPINE BESYLATE 5 MG PO TABS
5.0000 mg | ORAL_TABLET | Freq: Every day | ORAL | Status: DC
  Administered 2015-02-09 – 2015-02-10 (×2): 5 mg via ORAL
  Filled 2015-02-09 (×3): qty 1

## 2015-02-09 MED ORDER — HYDRALAZINE HCL 20 MG/ML IJ SOLN
2.0000 mg | Freq: Once | INTRAMUSCULAR | Status: AC
  Administered 2015-02-09: 2 mg via INTRAVENOUS
  Filled 2015-02-09: qty 1

## 2015-02-09 NOTE — Progress Notes (Signed)
Speech Language Pathology Treatment: Dysphagia  Patient Details Name: Kenneth Potter MRN: [id number] DOB: October 15, 1926 Today's Date: 02/09/2015 Time: 4765-4650 SLP Time Calculation (min) (ACUTE ONLY): 15 min  Assessment / Plan / Recommendation Clinical Impression  Kenneth Potter was happy to see SLP for trials of upgraded textures. Pt now with upper and lower dentures in place and much more alert today. He tolerated trials of mech soft and regular textures with min cues for lingual sweep and to alternate solids with liquids. OK to upgrade to D3/mech soft. SLP follow x1   HPI HPI: Kenneth Potter is an 81 yo male with dm2, htn, cad c/o disorientation this past evening. + feverf, + cough, dry . Slight sob. +n/v (no blood), Denies cp, palp, diarrhea, brbpr, black stool. Pt was brought to ED and found to have T 103.7 and possible pneumonia on CXR. Pt was noted to be hypokalemic, and will be admitted for pneumonia. SLP was asked to evaluate swallow. Family at bedside during evaluation. Both pt and daughter endorse that pt has occasionally choking episodes on solid foods. He was seen by SLP at SNF last year and was told to alternate solids and liquids. Pt has dentures, not present, and son states that he doesn't wear them very often to eat. Pt's appetite has decreased since the death of his wife a few years ago per daughter. He likes cream potatoes and macaroni and cheese, does not like to use a straw, and prefers vanilla over chocolate flavors.   Pertinent Vitals Pain Assessment: No/denies pain  SLP Plan  Continue with current plan of care    Recommendations Diet recommendations: Dysphagia 3 (mechanical soft);Thin liquid Liquids provided via: Cup Medication Administration: Whole meds with liquid Supervision: Patient able to self feed;Intermittent supervision to cue for compensatory strategies Compensations: Slow rate;Small sips/bites;Check for pocketing;Follow solids with liquid Postural Changes and/or  Swallow Maneuvers: Seated upright 90 degrees;Upright 30-60 min after meal              Oral Care Recommendations: Oral care BID Follow up Recommendations: 24 hour supervision/assistance Plan: Continue with current plan of care    Thank you,  Genene Churn, [place]      [place] 02/09/2015, 3:27 PM

## 2015-02-09 NOTE — Care Management Utilization Note (Signed)
UR completed 

## 2015-02-09 NOTE — Progress Notes (Signed)
Pt's B/P reading 198/82. Pt assessed and in no acute distress. MD notified and received telephone order with readback for Hydralazine 10 mg to be given once to patient. Will continue to monitor patient.

## 2015-02-09 NOTE — Progress Notes (Signed)
Physical Therapy Treatment Patient Details Name: Kenneth Potter MRN: [id number] DOB: 02-02-1926 Today's Date: 02/09/2015    History of Present Illness The patient is an 81 year old man with history of diet-controlled diabetes mellitus, CAD, hypertension, dementia, who was admitted by Dr. Maudie Mercury this morning for pneumonia.    PT Comments    Pt agreeable to treatment today, without complaints of pain or difficulty breathing.  Some coughing noted during treatment session.   Pt tolerated spine and seated exercises without complaints, though rest break between activities was required.  Noted improved gait distance today, from 15 feet to 60 feet with use of RW.  Pt did have some difficulty with navigation of RW in closed space or around obstacles (chair in hallway); family reports pt does not use a RW at home.  D/C recommendations remain appropriate and pt is progressing towards goals.    Follow Up Recommendations  Home health PT     Equipment Recommendations  None recommended by PT       Precautions / Restrictions Precautions Precautions: Fall Restrictions Weight Bearing Restrictions: No    Mobility  Bed Mobility Overal bed mobility: Modified Independent             General bed mobility comments: Increased time and VC to complete task  Transfers Overall transfer level: Needs assistance Equipment used: Rolling walker (2 wheeled) Transfers: Sit to/from [employer] Sit to Stand: Supervision Stand pivot transfers: Min guard       General transfer comment: VC for technique for correct UE placement  Ambulation/Gait Ambulation/Gait assistance: Min guard Ambulation Distance (Feet): 60 Feet Assistive device: Rolling walker (2 wheeled) Gait Pattern/deviations: Step-through pattern;Decreased step length - right;Decreased step length - left;Trunk flexed   Gait velocity interpretation: Below normal speed for age/gender           Cognition Arousal/Alertness:  Awake/alert Behavior During Therapy: WFL for tasks assessed/performed Overall Cognitive Status: History of cognitive impairments - at baseline                      Exercises General Exercises - Lower Extremity Long Arc Quad: AROM;Both;15 reps;Seated Heel Slides: AROM;Both;15 reps;Supine Hip ABduction/ADduction: AROM;Both;15 reps;Supine Toe Raises: AROM;Both;15 reps;Seated    General Comments        Pertinent Vitals/Pain Pain Assessment: No/denies pain           PT Goals (current goals can now be found in the care plan section) Acute Rehab PT Goals Patient Stated Goal: To go home PT Goal Formulation: With patient/family Time For Goal Achievement: 02/22/15 Potential to Achieve Goals: Good Progress towards PT goals: Progressing toward goals    Frequency  Min 2X/week    PT Plan Current plan remains appropriate       End of Session Equipment Utilized During Treatment: Gait belt Activity Tolerance: Patient tolerated treatment well Patient left: in bed;with family/visitor present;with call bell/phone within reach     Time: 1553-1626 PT Time Calculation (min) (ACUTE ONLY): 33 min  Charges:  $Gait Training: 8-22 mins $Therapeutic Exercise: 8-22 mins                     Lonna Cobb, DPT [phone]  02/09/2015, 4:33 PM

## 2015-02-09 NOTE — Progress Notes (Signed)
[facility] PROGRESS NOTE  EDRIC FETTERMAN GYI:[id number] DOB: January 21, 1926 DOA: 02/08/2015 PCP: [name], MD  Assessment/Plan: 1. Community acquired pneumonia. Appears to be clinically improving with antibiotic therapy. No further fevers. Blood cultures have shown no growth. Anticipate changing to oral antibiotics tomorrow. 2. Hypokalemia. Improved. 3. Hypertension. Patient is not on any antihypertensive medications at home. She has had several high blood pressures in the hospital. We'll start him on low-dose Norvasc. 4. Type 2 diabetes. Patient is no longer taking Januvia at home her family. Continue diet control. Blood sugars are in reasonable range. 5. Dementia. Continue Aricept.  Code Status: full code Family Communication: discussed with patient and daughter at the bedside Disposition Plan: discharge home once improved, likely in a.m.   Consultants:    Procedures:    Antibiotics:  Rocephin 2/9>>  azithro 2/9>>  HPI/Subjective: Feeling better, shortness of breath improved, no significant cough  Objective: Filed Vitals:   02/09/15 1518  BP: 133/54  Pulse: 63  Temp:   Resp:     Intake/Output Summary (Last 24 hours) at 02/09/15 1824 Last data filed at 02/09/15 1405  Gross per 24 hour  Intake    480 ml  Output    700 ml  Net   -220 ml   Filed Weights   02/08/15 0216 02/08/15 1500  Weight: 59.421 kg (131 lb) 59.875 kg (132 lb)    Exam:   General:  NAD  Cardiovascular: S1, s2 RRR  Respiratory: CTA B  Abdomen: soft, nt, nd, bs+  Musculoskeletal: no edema b/l   Data Reviewed: Basic Metabolic Panel:  Recent Labs Lab 02/08/15 0242 02/09/15 0712  NA 138 141  K 3.2* 3.8  CL 109 112  CO2 23 23  GLUCOSE 171* 137*  BUN 13 15  CREATININE 1.20 1.14  CALCIUM 8.6 8.4   Liver Function Tests:  Recent Labs Lab 02/08/15 0242  AST 22  ALT 11  ALKPHOS 70  BILITOT 1.0  PROT 6.6  ALBUMIN 3.6   No results for input(s): LIPASE, AMYLASE in  the last 168 hours. No results for input(s): AMMONIA in the last 168 hours. CBC:  Recent Labs Lab 02/08/15 0242 02/09/15 0712  WBC 8.9 14.2*  HGB 13.5 11.9*  HCT 38.5* 34.7*  MCV 94.8 96.1  PLT 103* 146*   Cardiac Enzymes:  Recent Labs Lab 02/08/15 0242  TROPONINI 0.07*   BNP (last 3 results) No results for input(s): BNP in the last 8760 hours.  ProBNP (last 3 results)  Recent Labs  04/15/14 1145 09/01/14 1425  PROBNP 183.0 470.8*    CBG:  Recent Labs Lab 02/08/15 1550 02/08/15 2056 02/09/15 0724 02/09/15 1124 02/09/15 1637  GLUCAP 115* 117* 131* 110* 122*    Recent Results (from the past 240 hour(s))  Culture, blood (routine x 2)     Status: None (Preliminary result)   Collection Time: 02/08/15  3:55 AM  Result Value Ref Range Status   Specimen Description BLOOD RIGHT ANTECUBITAL  Final   Special Requests BOTTLES DRAWN AEROBIC AND ANAEROBIC 5CC EACH  Final   Culture NO GROWTH 1 DAY  Final   Report Status PENDING  Incomplete  Culture, blood (routine x 2)     Status: None (Preliminary result)   Collection Time: 02/08/15  3:55 AM  Result Value Ref Range Status   Specimen Description BLOOD LEFT ANTECUBITAL  Final   Special Requests BOTTLES DRAWN AEROBIC AND ANAEROBIC 8CC EACH  Final   Culture NO GROWTH 1 DAY  Final   Report Status PENDING  Incomplete  Urine culture     Status: None   Collection Time: 02/08/15  4:29 AM  Result Value Ref Range Status   Specimen Description URINE, CATHETERIZED  Final   Special Requests NONE  Final   Colony Count NO GROWTH Performed at [facility]   Final   Culture NO GROWTH Performed at [facility]   Final   Report Status 02/09/2015 FINAL  Final     Studies: Dg Chest Portable 1 View  02/08/2015   CLINICAL DATA:  Acute onset of weakness and altered mental status. Initial encounter.  EXAM: PORTABLE CHEST - 1 VIEW  COMPARISON:  Chest radiograph performed 07/26/2014  FINDINGS: The lungs are  well-aerated. Fluffy right-sided airspace opacity may reflect pneumonia or asymmetric pulmonary edema. Underlying vascular congestion is noted. No definite pleural effusion or pneumothorax is seen.  The cardiomediastinal silhouette is borderline normal in size. No acute osseous abnormalities are seen. The patient's right shoulder hemiarthroplasty is grossly unremarkable.  IMPRESSION: Fluffy right-sided airspace opacity may reflect pneumonia or asymmetric pulmonary edema. Underlying vascular congestion noted.   Electronically Signed   By: Garald Balding M.D.   On: 02/08/2015 03:59    Scheduled Meds: . amLODipine  5 mg Oral Daily  . azithromycin  500 mg Intravenous Q24H  . benzonatate  100 mg Oral TID  . cefTRIAXone (ROCEPHIN)  IV  1 g Intravenous Q24H  . donepezil  5 mg Oral QHS  . enoxaparin (LOVENOX) injection  40 mg Subcutaneous Q24H  . feeding supplement (GLUCERNA SHAKE)  237 mL Oral TID BM  . insulin aspart  0-5 Units Subcutaneous QHS  . insulin aspart  0-9 Units Subcutaneous TID WC  . pantoprazole  40 mg Oral Daily  . polyethylene glycol powder  17 g Oral Daily  . potassium chloride  10 mEq Oral BID   Continuous Infusions:   Principal Problem:   Pneumonia Active Problems:   CAD (coronary artery disease)   DM (diabetes mellitus) type II controlled with renal manifestation   Encephalopathy acute   Hypokalemia    Time spent: 66mins    Kemesha Mosey  [facility] Pager [phone]. If 7PM-7AM, please contact night-coverage at [email or web address], password [facility] 02/09/2015, 6:24 PM  LOS: 1 day

## 2015-02-10 DIAGNOSIS — I10 Essential (primary) hypertension: Secondary | ICD-10-CM

## 2015-02-10 LAB — CBC
HCT: 36.2 % — ABNORMAL LOW (ref 39.0–52.0)
HEMOGLOBIN: 12.5 g/dL — AB (ref 13.0–17.0)
MCH: 33 pg (ref 26.0–34.0)
MCHC: 34.5 g/dL (ref 30.0–36.0)
MCV: 95.5 fL (ref 78.0–100.0)
PLATELETS: 156 10*3/uL (ref 150–400)
RBC: 3.79 MIL/uL — AB (ref 4.22–5.81)
RDW: 13.4 % (ref 11.5–15.5)
WBC: 10.9 10*3/uL — AB (ref 4.0–10.5)

## 2015-02-10 LAB — GLUCOSE, CAPILLARY
GLUCOSE-CAPILLARY: 149 mg/dL — AB (ref 70–99)
Glucose-Capillary: 172 mg/dL — ABNORMAL HIGH (ref 70–99)

## 2015-02-10 LAB — BASIC METABOLIC PANEL
ANION GAP: 6 (ref 5–15)
BUN: 12 mg/dL (ref 6–23)
CO2: 22 mmol/L (ref 19–32)
Calcium: 8.6 mg/dL (ref 8.4–10.5)
Chloride: 113 mmol/L — ABNORMAL HIGH (ref 96–112)
Creatinine, Ser: 1.11 mg/dL (ref 0.50–1.35)
GFR calc non Af Amer: 58 mL/min — ABNORMAL LOW (ref >90)
GFR, EST AFRICAN AMERICAN: 67 mL/min — AB (ref >90)
Glucose, Bld: 123 mg/dL — ABNORMAL HIGH (ref 70–99)
POTASSIUM: 4 mmol/L (ref 3.5–5.1)
Sodium: 141 mmol/L (ref 135–145)

## 2015-02-10 MED ORDER — BENZONATATE 100 MG PO CAPS: 100.0000 mg | ORAL_CAPSULE | Freq: Three times a day (TID) | ORAL | Status: DC | PRN

## 2015-02-10 MED ORDER — GUAIFENESIN ER 600 MG PO TB12: 600.0000 mg | ORAL_TABLET | Freq: Two times a day (BID) | ORAL | Status: DC

## 2015-02-10 MED ORDER — HYDRALAZINE HCL 25 MG PO TABS: 25.0000 mg | ORAL_TABLET | Freq: Three times a day (TID) | ORAL | Status: DC

## 2015-02-10 MED ORDER — AMLODIPINE BESYLATE 5 MG PO TABS: 5.0000 mg | ORAL_TABLET | Freq: Every day | ORAL | Status: DC

## 2015-02-10 MED ORDER — LEVOFLOXACIN 750 MG PO TABS: 750.0000 mg | ORAL_TABLET | Freq: Every day | ORAL | Status: DC

## 2015-02-10 NOTE — Discharge Summary (Signed)
Physician Discharge Summary  Kenneth Potter:[id number] DOB: June 03, 1926 DOA: 02/08/2015  PCP: [name], MD  Admit date: 02/08/2015 Discharge date: 02/10/2015  Time spent: 40 minutes  Recommendations for Outpatient Follow-up:  1. Follow-up with primary care physician once 2 weeks 2. He has been started on Norvasc and hydralazine for high blood pressure  Discharge Diagnoses:  Principal Problem:   Pneumonia Active Problems:   CAD (coronary artery disease)   DM (diabetes mellitus) type II controlled with renal manifestation   Encephalopathy acute   Hypokalemia  bradycardia  Discharge Condition: Improved  Diet recommendation: Low-salt, low carb  Filed Weights   02/08/15 0216 02/08/15 1500 02/10/15 0554  Weight: 59.421 kg (131 lb) 59.875 kg (132 lb) 59.6 kg (131 lb 6.3 oz)    History of present illness:  The patient was brought to the hospital with altered mental status. Workup in the emergency room indicated a possible pneumonia. He was afebrile with a doctor 103.7. He was admitted to the hospital for further treatment of pneumonia.  Hospital Course:  Patient was started on intravenous antibiotics. His fevers resolved and his overall mental state returned to baseline. He does not have any skin shortness of breath at this time and is oxygenating well on room air. He does not have any shortness of breath. Cough is minimal. He's been transitioned to oral antibiotics to complete his course. His encephalopathy was likely related to his underlying infection which has since resolved. He was noted to have high blood pressures in the hospital was started on amlodipine as well as hydralazine. This will need further titration as an outpatient. Regarding his diabetes, he reports that he is no longer taking Januvia. Blood sugars have been stable here in the hospital. This can be further addressed in the outpatient setting. It was noted by staff that the patient may have developed bradycardia.  Dinamap readings were charted as heart rates in the 30s. Patient remained asymptomatic and on-call physician was not notified overnight. EKG was checked in the morning which showed a heart rate in the 60s with frequent PACs. This can be further followed up in the outpatient setting. Would not use any beta blockers at this time.  Procedures:    Consultations:    Discharge Exam: Filed Vitals:   02/10/15 0800  BP: 182/79  Pulse: 32  Temp: 97.9 F (36.6 C)  Resp: 20    General: NAD Cardiovascular: S1, S2 RRR Respiratory: CTA B   Discharge Instructions   Discharge Instructions    Call MD for:  difficulty breathing, headache or visual disturbances    Complete by:  As directed      Call MD for:  temperature >100.4    Complete by:  As directed      Diet - low sodium heart healthy    Complete by:  As directed      Increase activity slowly    Complete by:  As directed           Discharge Medication List as of 02/10/2015  2:28 PM    START taking these medications   Details  amLODipine (NORVASC) 5 MG tablet Take 1 tablet (5 mg total) by mouth daily., Starting 02/10/2015, Until Discontinued, Normal    benzonatate (TESSALON) 100 MG capsule Take 1 capsule (100 mg total) by mouth 3 (three) times daily as needed for cough., Starting 02/10/2015, Until Discontinued, Print    guaiFENesin (MUCINEX) 600 MG 12 hr tablet Take 1 tablet (600 mg total) by mouth  2 (two) times daily., Starting 02/10/2015, Until Discontinued, Normal    hydrALAZINE (APRESOLINE) 25 MG tablet Take 1 tablet (25 mg total) by mouth 3 (three) times daily., Starting 02/10/2015, Until Discontinued, Print    levofloxacin (LEVAQUIN) 750 MG tablet Take 1 tablet (750 mg total) by mouth daily., Starting 02/10/2015, Until Discontinued, Print      CONTINUE these medications which have NOT CHANGED   Details  acetaminophen (TYLENOL) 500 MG tablet Take 1 tablet (500 mg total) by mouth every 6 (six) hours as needed for moderate pain  or fever., Starting 08/03/2014, Until Discontinued, No Print    bisacodyl (DULCOLAX) 10 MG suppository Place 1 suppository (10 mg total) rectally daily as needed for moderate constipation., Starting 08/03/2014, Until Discontinued, No Print    donepezil (ARICEPT) 5 MG tablet Take 1 tablet (5 mg total) by mouth at bedtime., Starting 10/08/2014, Until Discontinued, Normal    feeding supplement, GLUCERNA SHAKE, (GLUCERNA SHAKE) LIQD Take 237 mLs by mouth 3 (three) times daily between meals., Starting 09/02/2014, Until Discontinued, Normal    FIBER PO Take 1 tablet by mouth daily., Until Discontinued, Historical Med    omeprazole (PRILOSEC) 40 MG capsule take 1 capsule by mouth once daily for REFLUX, Normal    polyethylene glycol powder (GLYCOLAX/MIRALAX) powder Take 17 g by mouth daily., Starting 10/08/2014, Until Discontinued, Normal    simvastatin (ZOCOR) 20 MG tablet Take 20 mg by mouth at bedtime., Until Discontinued, Historical Med      STOP taking these medications     nitroGLYCERIN (NITROSTAT) 0.4 MG SL tablet        Allergies  Allergen Reactions  . Codeine Nausea Only    Nausea per family  . Aspirin     Can not tolerate in high dosage   Follow-up Information    Follow up with [name], MD. Schedule an appointment as soon as possible for a visit in 2 weeks.   Specialty:  Family Medicine   Contact information:   [address] [place]  [address] [phone]        The results of significant diagnostics from this hospitalization (including imaging, microbiology, ancillary and laboratory) are listed below for reference.    Significant Diagnostic Studies: Dg Chest Portable 1 View  02/08/2015   CLINICAL DATA:  Acute onset of weakness and altered mental status. Initial encounter.  EXAM: PORTABLE CHEST - 1 VIEW  COMPARISON:  Chest radiograph performed 07/26/2014  FINDINGS: The lungs are well-aerated. Fluffy right-sided airspace opacity may reflect pneumonia or  asymmetric pulmonary edema. Underlying vascular congestion is noted. No definite pleural effusion or pneumothorax is seen.  The cardiomediastinal silhouette is borderline normal in size. No acute osseous abnormalities are seen. The patient's right shoulder hemiarthroplasty is grossly unremarkable.  IMPRESSION: Fluffy right-sided airspace opacity may reflect pneumonia or asymmetric pulmonary edema. Underlying vascular congestion noted.   Electronically Signed   By: Garald Balding M.D.   On: 02/08/2015 03:59    Microbiology: Recent Results (from the past 240 hour(s))  Culture, blood (routine x 2)     Status: None (Preliminary result)   Collection Time: 02/08/15  3:55 AM  Result Value Ref Range Status   Specimen Description BLOOD RIGHT ANTECUBITAL  Final   Special Requests BOTTLES DRAWN AEROBIC AND ANAEROBIC 5CC EACH  Final   Culture NO GROWTH 2 DAYS  Final   Report Status PENDING  Incomplete  Culture, blood (routine x 2)     Status: None (Preliminary result)  Collection Time: 02/08/15  3:55 AM  Result Value Ref Range Status   Specimen Description BLOOD LEFT ANTECUBITAL  Final   Special Requests BOTTLES DRAWN AEROBIC AND ANAEROBIC 8CC EACH  Final   Culture NO GROWTH 2 DAYS  Final   Report Status PENDING  Incomplete  Urine culture     Status: None   Collection Time: 02/08/15  4:29 AM  Result Value Ref Range Status   Specimen Description URINE, CATHETERIZED  Final   Special Requests NONE  Final   Colony Count NO GROWTH Performed at [facility]   Final   Culture NO GROWTH Performed at [facility]   Final   Report Status 02/09/2015 FINAL  Final     Labs: Basic Metabolic Panel:  Recent Labs Lab 02/08/15 0242 02/09/15 0712 02/10/15 0604  NA 138 141 141  K 3.2* 3.8 4.0  CL 109 112 113*  CO2 23 23 22   GLUCOSE 171* 137* 123*  BUN 13 15 12   CREATININE 1.20 1.14 1.11  CALCIUM 8.6 8.4 8.6   Liver Function Tests:  Recent Labs Lab 02/08/15 0242  AST 22  ALT  11  ALKPHOS 70  BILITOT 1.0  PROT 6.6  ALBUMIN 3.6   No results for input(s): LIPASE, AMYLASE in the last 168 hours. No results for input(s): AMMONIA in the last 168 hours. CBC:  Recent Labs Lab 02/08/15 0242 02/09/15 0712 02/10/15 0604  WBC 8.9 14.2* 10.9*  HGB 13.5 11.9* 12.5*  HCT 38.5* 34.7* 36.2*  MCV 94.8 96.1 95.5  PLT 103* 146* 156   Cardiac Enzymes:  Recent Labs Lab 02/08/15 0242  TROPONINI 0.07*   BNP: BNP (last 3 results) No results for input(s): BNP in the last 8760 hours.  ProBNP (last 3 results)  Recent Labs  04/15/14 1145 09/01/14 1425  PROBNP 183.0 470.8*    CBG:  Recent Labs Lab 02/09/15 1124 02/09/15 1637 02/09/15 2040 02/10/15 0740 02/10/15 1109  GLUCAP 110* 122* 136* 172* 149*       Signed:  MEMON,JEHANZEB  [facility] 02/10/2015, 8:06 PM

## 2015-02-10 NOTE — Progress Notes (Signed)
Discharge instructions and prescriptions reviewed with patient and family.  Time allowed for questions and answered questions.  IV removed, pt tolerated well.  Will continue to monitor until pt leaves the floor.

## 2015-02-10 NOTE — Care Management Note (Signed)
    Page 1 of 1   02/10/2015     5:05:05 PM CARE MANAGEMENT NOTE 02/10/2015  Patient:  Kenneth Potter, Kenneth Potter   Account Number:  [id number]  Date Initiated:  02/10/2015  Documentation initiated by:  Vladimir Creeks  Subjective/Objective Assessment:   Pt admitted with AMS, and bradycardia. He is from home, with dgt, and other family, and they plan to take him home at D/C. He has Alvis Lemmings active in the home with PT and would like to continue with [facility], and PT. He has an aide also     Action/Plan:   Spoke with Dr Roderic Palau and will add RN to follow due to bradycardia. No DME needs   Anticipated DC Date:  02/10/2015   Anticipated DC Plan:  [place]  CM consult      [facility] Choice  HOME HEALTH   Choice offered to / List presented to:  C-1 Patient      DME agency  [place] arranged  HH-1 RN  [place].   Status of service:   Medicare Important Message given?  NA - LOS <3 / Initial given by admissions (If response is "NO", the following Medicare IM given date fields will be blank) Date Medicare IM given:   Medicare IM given by:   Date Additional Medicare IM given:   Additional Medicare IM given by:    Discharge Disposition:  [place]  Per UR Regulation:  Reviewed for med. necessity/level of care/duration of stay  If discussed at [place] of Stay Meetings, dates discussed:    Comments:  02/10/15 [place] RN/CM

## 2015-02-10 NOTE — Progress Notes (Signed)
Physical Therapy Treatment Patient Details Name: Kenneth Potter MRN: [id number] DOB: 19-Apr-1926 Today's Date: 02/10/2015    History of Present Illness      PT Comments    Improved bed mobility, able to demonstrate appropriate mechanics with supine to sit with min cueing for  Hand placement to assist with bed mobility and transfers.  Pt able to demonstrate safe gait mechanics with RW, SBA with increased distance of 160 feet, minor difficulty with navigation with RW, tends to drift to Lt.  Pt left in chair with call bell within reach, chair alarm set and visitor in room  Follow Up Recommendations        Equipment Recommendations       Recommendations for Other Services       Precautions / Restrictions Precautions Precautions: Fall Restrictions Weight Bearing Restrictions: No    Mobility  Bed Mobility Overal bed mobility: Modified Independent             General bed mobility comments: Able to complete independently, did require cueing for handplacement to assist  Transfers Overall transfer level: Modified independent Equipment used: Rolling walker (2 wheeled)   Sit to Stand: Supervision         General transfer comment: VC for hand and foot placement prior standing and sitting  Ambulation/Gait Ambulation/Gait assistance: Min guard Ambulation Distance (Feet): 160 Feet Assistive device: Rolling walker (2 wheeled) Gait Pattern/deviations: Step-through pattern;Decreased stride length;Trunk flexed   Gait velocity interpretation: Below normal speed for age/gender     Stairs            Wheelchair Mobility    Modified Rankin (Stroke Patients Only)       Balance                                    Cognition Arousal/Alertness: Awake/alert Behavior During Therapy: WFL for tasks assessed/performed Overall Cognitive Status: History of cognitive impairments - at baseline                      Exercises      General Comments        Pertinent Vitals/Pain Pain Assessment: No/denies pain    Home Living                      Prior Function            PT Goals (current goals can now be found in the care plan section) Progress towards PT goals: Progressing toward goals    Frequency       PT Plan Current plan remains appropriate    Co-evaluation             End of Session Equipment Utilized During Treatment: Gait belt Activity Tolerance: Patient tolerated treatment well Patient left: in chair;with call bell/phone within reach;with chair alarm set;with family/visitor present     Time: 8366-2947 PT Time Calculation (min) (ACUTE ONLY): 20 min  Charges:  $Gait Training: 8-22 mins                    G Codes:      Aldona Lento 02/10/2015, 4:29 PM

## 2015-02-13 LAB — CULTURE, BLOOD (ROUTINE X 2)
Culture: NO GROWTH
Culture: NO GROWTH

## 2015-02-15 ENCOUNTER — Ambulatory Visit (INDEPENDENT_AMBULATORY_CARE_PROVIDER_SITE_OTHER): Payer: Medicare Other | Admitting: Family Medicine

## 2015-02-15 ENCOUNTER — Encounter: Payer: Self-pay | Admitting: Family Medicine

## 2015-02-15 VITALS — BP 136/78 | HR 68 | Temp 97.1°F | Resp 16 | Ht 65.0 in | Wt 127.0 lb

## 2015-02-15 DIAGNOSIS — E1129 Type 2 diabetes mellitus with other diabetic kidney complication: Secondary | ICD-10-CM

## 2015-02-15 DIAGNOSIS — G4700 Insomnia, unspecified: Secondary | ICD-10-CM | POA: Insufficient documentation

## 2015-02-15 DIAGNOSIS — F0390 Unspecified dementia without behavioral disturbance: Secondary | ICD-10-CM | POA: Diagnosis not present

## 2015-02-15 DIAGNOSIS — I2510 Atherosclerotic heart disease of native coronary artery without angina pectoris: Secondary | ICD-10-CM

## 2015-02-15 DIAGNOSIS — E43 Unspecified severe protein-calorie malnutrition: Secondary | ICD-10-CM

## 2015-02-15 DIAGNOSIS — E876 Hypokalemia: Secondary | ICD-10-CM

## 2015-02-15 DIAGNOSIS — N058 Unspecified nephritic syndrome with other morphologic changes: Secondary | ICD-10-CM

## 2015-02-15 DIAGNOSIS — I10 Essential (primary) hypertension: Secondary | ICD-10-CM | POA: Diagnosis not present

## 2015-02-15 DIAGNOSIS — R1030 Lower abdominal pain, unspecified: Secondary | ICD-10-CM

## 2015-02-15 DIAGNOSIS — J189 Pneumonia, unspecified organism: Secondary | ICD-10-CM

## 2015-02-15 NOTE — Assessment & Plan Note (Signed)
Resolved prior to discharge we'll recheck today

## 2015-02-15 NOTE — Assessment & Plan Note (Signed)
Worsening dementia however family unable to provide financially for him to be in a skilled nursing facility. They will continue monitoring him at home he has 2 daughters to stay with him. He will continue the Aricept. We will try the melatonin for his sleep. If this does not work I will recommend Seroquel or Ativan

## 2015-02-15 NOTE — Assessment & Plan Note (Signed)
Diet controlled.  

## 2015-02-15 NOTE — Assessment & Plan Note (Signed)
It is very difficult to get a good handle on his abdominal pain is when distracted he did not yell out and other times he would just yell out in pain even if it was on the opposite side where he states the pain was located. I do see a small hernia but I do not see where this could be causing any pain. He is already status post cholecystectomy. His bowels are at baseline there is no diarrhea no severe constipation. His urine culture was just checked a few days ago and that was negative We will use Tylenol and see if that pacified him. If he continues to complain we will have to get a CT of abdomen and pelvis

## 2015-02-15 NOTE — Patient Instructions (Signed)
Try Melatonin 1mg , give 1/2 tablet at 8pm, increase to 1 if no improvement in 1 week Give norvasc for blood pressure , we will call on Friday for his blood pressure readings Give Glucerna twice a day for nutrition and sweet tooth Give Tylenol for the pelvic pain, he has a small hernia but is not very lage and should not be causing severe pain His Urine Culture was negative for any infection I recommend he get a Medical Alert bracelet in case he wanders I will call with lab results CXR in 2 weeks to re-evaluate pnemonia F/U 2 months

## 2015-02-15 NOTE — Assessment & Plan Note (Signed)
I will have him continue the amlodipine 5 mg he will check his blood pressure at home are goal is to keep him maybe somewhere around 140-150 over 90s to prevent hypotensive symptoms Discontinue the hydralazine

## 2015-02-15 NOTE — Assessment & Plan Note (Signed)
CXR in 2 weeks,repeat WBC today

## 2015-02-15 NOTE — Assessment & Plan Note (Signed)
Will place him on Glucerna twice a day this may help with his sugar cravings as well

## 2015-02-15 NOTE — Progress Notes (Signed)
Patient ID: Kenneth Potter, male   DOB: 02-29-1926, 81 y.o.   MRN: [id number]   Subjective:    Patient ID: Kenneth Potter, male    DOB: 07-05-1926, 81 y.o.   MRN: [id number]  Patient presents for hospital F/U  patient here for hospital follow-up. He was recently discharged last week secondary to altered mental status was found to have a pneumonia. He was also complaining of some lower abdominal groin pain for the past couple months his urine culture was negative. He's not had any change in his bowels. The family is not sure if this is just cognitive decline he has not had any falls.  Hypertension his blood pressure was found to be quite elevated when he was in the hospital I had taken him off his medications because of hypotensive episodes. He is now on Norvasc 5 mg as well as hydralazine 25 mg 3 times a day however his family is concerned about him being on all of the blood pressure medication. His daughter Kenneth Potter who is the power of attorney was present during the visit via telephone.  She also states that he's been craving sweets all the time his last A1c was 6.6% a few weeks ago. He is not on any medications for his diabetes.  They also note that he does not want to sleep at night and instead once asleep during the daytime and will become very agitated and irritated with the family. Kenneth Potter who is the power of attorney does not one appointment him on any prescription medications at this time.    Review Of Systems:  GEN- denies fatigue, fever, weight loss,weakness, recent illness HEENT- denies eye drainage, change in vision, nasal discharge, CVS- denies chest pain, palpitations RESP- denies SOB, cough, wheeze ABD- denies N/V, change in stools, +abd pain GU- denies dysuria, hematuria, dribbling, incontinence MSK- denies joint pain, muscle aches, injury Neuro- denies headache, dizziness, syncope, seizure activity       Objective:    BP 136/78 mmHg  Pulse 68  Temp(Src) 97.1 F (36.2 C)  (Oral)  Resp 16  Ht 5\' 5"  (1.651 m)  Wt 127 lb (57.607 kg)  BMI 21.13 kg/m2 GEN- NAD, alert and oriented x2 HEENT- PERRL, EOMI, non injected sclera, pink conjunctiva, MMM, oropharynx clear Neck- Supple, no  LAD CVS- RRR, no murmur RESP-CTAB, no wheeze, no rales ABD-NABS,soft,ND, TTP initially suprapubic, then RLQ, then TTP along groin and thigh bilat, small inguinal hernia noted on right side, when distracted did not hollar out in pain EXT- No edema Neuro- short shuffled gait, needs assistance with ambulation Pulses- Radial 2+        Assessment & Plan:      Problem List Items Addressed This Visit      Unprioritized   CAP (community acquired pneumonia) - Primary   Relevant Orders   CBC with Differential/Platelet   Comprehensive metabolic panel      Note: This dictation was prepared with Dragon dictation along with smaller phrase technology. Any transcriptional errors that result from this process are unintentional.

## 2015-02-16 ENCOUNTER — Telehealth: Payer: Self-pay | Admitting: Family Medicine

## 2015-02-16 DIAGNOSIS — R1030 Lower abdominal pain, unspecified: Secondary | ICD-10-CM

## 2015-02-16 LAB — COMPREHENSIVE METABOLIC PANEL
ALT: 8 U/L (ref 0–53)
AST: 16 U/L (ref 0–37)
Albumin: 3.9 g/dL (ref 3.5–5.2)
Alkaline Phosphatase: 67 U/L (ref 39–117)
BILIRUBIN TOTAL: 0.7 mg/dL (ref 0.2–1.2)
BUN: 11 mg/dL (ref 6–23)
CO2: 25 mEq/L (ref 19–32)
CREATININE: 1.16 mg/dL (ref 0.50–1.35)
Calcium: 9.2 mg/dL (ref 8.4–10.5)
Chloride: 104 mEq/L (ref 96–112)
GLUCOSE: 99 mg/dL (ref 70–99)
Potassium: 4.1 mEq/L (ref 3.5–5.3)
Sodium: 140 mEq/L (ref 135–145)
Total Protein: 6.9 g/dL (ref 6.0–8.3)

## 2015-02-16 LAB — CBC WITH DIFFERENTIAL/PLATELET
BASOS ABS: 0 10*3/uL (ref 0.0–0.1)
Basophils Relative: 1 % (ref 0–1)
Eosinophils Absolute: 0.1 10*3/uL (ref 0.0–0.7)
Eosinophils Relative: 2 % (ref 0–5)
HCT: 42.4 % (ref 39.0–52.0)
Hemoglobin: 14.3 g/dL (ref 13.0–17.0)
LYMPHS PCT: 36 % (ref 12–46)
Lymphs Abs: 1.6 10*3/uL (ref 0.7–4.0)
MCH: 32.7 pg (ref 26.0–34.0)
MCHC: 33.7 g/dL (ref 30.0–36.0)
MCV: 97 fL (ref 78.0–100.0)
MONOS PCT: 11 % (ref 3–12)
MPV: 9.8 fL (ref 8.6–12.4)
Monocytes Absolute: 0.5 10*3/uL (ref 0.1–1.0)
Neutro Abs: 2.3 10*3/uL (ref 1.7–7.7)
Neutrophils Relative %: 50 % (ref 43–77)
Platelets: 257 10*3/uL (ref 150–400)
RBC: 4.37 MIL/uL (ref 4.22–5.81)
RDW: 14.4 % (ref 11.5–15.5)
WBC: 4.5 10*3/uL (ref 4.0–10.5)

## 2015-02-16 NOTE — Telephone Encounter (Signed)
Patients daughter calling to get referral to a kidney specialist if possible   He was here yesterday and is still in a lot of pain  [phone]

## 2015-02-17 ENCOUNTER — Ambulatory Visit (HOSPITAL_COMMUNITY): Payer: Medicare Other

## 2015-02-17 NOTE — Telephone Encounter (Signed)
He doesn't need a kidney specialist, I will go ahead and get a CT of abdomen and pelvis on him. To see if there is something down in the bowels or near that hernia   Schedule CT abd/pelvis - oral contrast  for Kenneth Potter, ASAP

## 2015-02-17 NOTE — Telephone Encounter (Signed)
CT ordered per provider

## 2015-02-17 NOTE — Telephone Encounter (Signed)
?  ok to do referral to Kidney Specialist?

## 2015-02-18 ENCOUNTER — Telehealth: Payer: Self-pay | Admitting: Family Medicine

## 2015-02-18 NOTE — Telephone Encounter (Signed)
Call placed to patient and patient made aware.  

## 2015-02-18 NOTE — Telephone Encounter (Signed)
-----   Message from [place], LPN sent at [phone]  2:44 PM EST ----- Regarding: RE: f/u BP Call placed to patient daughter Judson Roch.  Reports BP as follows: 156/81 142/76 123/67 124/68  Patient daughter reports that patient continues to voice C/O pain in groin/ pelvis.   CT is re-scheduled for Thursday.  ----- Message -----    From: Alycia Rossetti, MD    Sent: 02/18/2015  11:53 AM      To: Eden Lathe Six, LPN Subject: FW: f/u BP                                     Call and give lab results, they were normal, also what are his BP running, make sure he gets the CT scan done ( seems family moved appt) ----- Message -----    From: Alycia Rossetti, MD    Sent: 02/18/2015      To: Alycia Rossetti, MD Subject: f/u BP

## 2015-02-18 NOTE — Telephone Encounter (Signed)
Continue with current BP medications  Let them know I scheduled the CT for yesterday but one of the family members had it rescheduled until next week. It is important that he gets the scan since the pain is persistant

## 2015-02-24 ENCOUNTER — Ambulatory Visit (HOSPITAL_COMMUNITY)
Admission: RE | Admit: 2015-02-24 | Discharge: 2015-02-24 | Disposition: A | Discharge status: Home / Self Care | Payer: Medicare Other | Source: Ambulatory Visit | Attending: Family Medicine | Admitting: Family Medicine

## 2015-02-24 ENCOUNTER — Telehealth: Payer: Self-pay | Admitting: *Deleted

## 2015-02-24 ENCOUNTER — Other Ambulatory Visit: Payer: Self-pay | Admitting: Family Medicine

## 2015-02-24 DIAGNOSIS — Z87891 Personal history of nicotine dependence: Secondary | ICD-10-CM | POA: Insufficient documentation

## 2015-02-24 DIAGNOSIS — K219 Gastro-esophageal reflux disease without esophagitis: Secondary | ICD-10-CM | POA: Insufficient documentation

## 2015-02-24 DIAGNOSIS — I10 Essential (primary) hypertension: Secondary | ICD-10-CM | POA: Diagnosis not present

## 2015-02-24 DIAGNOSIS — E119 Type 2 diabetes mellitus without complications: Secondary | ICD-10-CM | POA: Diagnosis not present

## 2015-02-24 DIAGNOSIS — R1030 Lower abdominal pain, unspecified: Secondary | ICD-10-CM | POA: Diagnosis not present

## 2015-02-24 DIAGNOSIS — J189 Pneumonia, unspecified organism: Secondary | ICD-10-CM

## 2015-02-24 DIAGNOSIS — Z8546 Personal history of malignant neoplasm of prostate: Secondary | ICD-10-CM | POA: Insufficient documentation

## 2015-02-24 NOTE — Telephone Encounter (Signed)
Received call from [facility] with [facility].   Reports that IV attempted x4 unsuccessfully.   Requested ok to use only oral contrast.   MD made aware and approved order.

## 2015-02-25 ENCOUNTER — Other Ambulatory Visit: Payer: Self-pay | Admitting: *Deleted

## 2015-02-25 MED ORDER — AMOXICILLIN-POT CLAVULANATE 875-125 MG PO TABS: 1.0000 | ORAL_TABLET | Freq: Two times a day (BID) | ORAL | Status: DC

## 2015-03-01 ENCOUNTER — Other Ambulatory Visit: Payer: Self-pay | Admitting: *Deleted

## 2015-03-01 MED ORDER — DONEPEZIL HCL 5 MG PO TABS: 5.0000 mg | ORAL_TABLET | Freq: Every day | ORAL | Status: DC

## 2015-03-01 NOTE — Telephone Encounter (Signed)
Received fax requesting refill on Aricept.   Refill appropriate and filled per protocol.  

## 2015-03-18 ENCOUNTER — Telehealth: Payer: Self-pay | Admitting: *Deleted

## 2015-03-18 ENCOUNTER — Other Ambulatory Visit: Payer: Self-pay | Admitting: *Deleted

## 2015-03-18 MED ORDER — AMLODIPINE BESYLATE 5 MG PO TABS: 5.0000 mg | ORAL_TABLET | Freq: Every day | ORAL | Status: DC

## 2015-03-18 NOTE — Telephone Encounter (Signed)
Pt daughter [name] called stating that pt is needing refill on Amlodipine 5mg  for 2 days wants to know if you still want him to be on it, spoke to pharmacist at [facility] and stated that they had sent in a refill request for amlodipine yesterday, pt is needing this medication but stating BP's have been doing good.

## 2015-03-18 NOTE — Telephone Encounter (Signed)
Received fax requesting refill on Norvasc.   Refill appropriate and filled per protocol.  

## 2015-04-18 ENCOUNTER — Ambulatory Visit: Payer: Medicare Other | Admitting: Family Medicine

## 2015-06-27 ENCOUNTER — Telehealth: Payer: Self-pay | Admitting: Family Medicine

## 2015-06-27 NOTE — Telephone Encounter (Signed)
Okay to write note, he has diagnosis that support this

## 2015-06-27 NOTE — Telephone Encounter (Signed)
Daughter called.  Needs letter to state father needs 24 hr care and supervision.  Trying to get help in the home.  Did not realize he had missed last appt (April)  Have rescheduled that for next Tuesday.  Have her phone number in place of his as he does  Not remember calls due to worsening dementia.

## 2015-06-28 ENCOUNTER — Encounter: Payer: Self-pay | Admitting: Family Medicine

## 2015-06-28 NOTE — Telephone Encounter (Signed)
Spoke to daughter, aware letter ready and will pick up tomorrow

## 2015-07-05 ENCOUNTER — Ambulatory Visit (INDEPENDENT_AMBULATORY_CARE_PROVIDER_SITE_OTHER): Payer: Medicare Other | Admitting: Family Medicine

## 2015-07-05 ENCOUNTER — Encounter: Payer: Self-pay | Admitting: Family Medicine

## 2015-07-05 VITALS — BP 128/66 | HR 64 | Temp 98.2°F | Resp 12 | Ht 65.0 in | Wt 126.0 lb

## 2015-07-05 DIAGNOSIS — F0390 Unspecified dementia without behavioral disturbance: Secondary | ICD-10-CM | POA: Diagnosis not present

## 2015-07-05 DIAGNOSIS — R531 Weakness: Secondary | ICD-10-CM

## 2015-07-05 DIAGNOSIS — E1129 Type 2 diabetes mellitus with other diabetic kidney complication: Secondary | ICD-10-CM

## 2015-07-05 DIAGNOSIS — K219 Gastro-esophageal reflux disease without esophagitis: Secondary | ICD-10-CM

## 2015-07-05 DIAGNOSIS — R32 Unspecified urinary incontinence: Secondary | ICD-10-CM

## 2015-07-05 DIAGNOSIS — R2681 Unsteadiness on feet: Secondary | ICD-10-CM | POA: Diagnosis not present

## 2015-07-05 DIAGNOSIS — I2510 Atherosclerotic heart disease of native coronary artery without angina pectoris: Secondary | ICD-10-CM

## 2015-07-05 DIAGNOSIS — I10 Essential (primary) hypertension: Secondary | ICD-10-CM

## 2015-07-05 DIAGNOSIS — N058 Unspecified nephritic syndrome with other morphologic changes: Secondary | ICD-10-CM

## 2015-07-05 DIAGNOSIS — R159 Full incontinence of feces: Secondary | ICD-10-CM | POA: Diagnosis not present

## 2015-07-05 MED ORDER — SIMVASTATIN 20 MG PO TABS: 20.0000 mg | ORAL_TABLET | Freq: Every day | ORAL | Status: DC

## 2015-07-05 MED ORDER — DONEPEZIL HCL 5 MG PO TABS: 5.0000 mg | ORAL_TABLET | Freq: Every day | ORAL | Status: DC

## 2015-07-05 MED ORDER — CLOTRIMAZOLE-BETAMETHASONE 1-0.05 % EX CREA: 1.0000 "application " | TOPICAL_CREAM | Freq: Two times a day (BID) | CUTANEOUS | Status: DC

## 2015-07-05 MED ORDER — OMEPRAZOLE 40 MG PO CPDR: DELAYED_RELEASE_CAPSULE | ORAL | Status: DC

## 2015-07-05 MED ORDER — AMLODIPINE BESYLATE 5 MG PO TABS: 5.0000 mg | ORAL_TABLET | Freq: Every day | ORAL | Status: AC

## 2015-07-05 NOTE — Assessment & Plan Note (Signed)
Diet controlled.  

## 2015-07-05 NOTE — Patient Instructions (Signed)
Referral for physical therapy Try to sign up for medicaid  Continue all other medications  F/U 6 months

## 2015-07-05 NOTE — Assessment & Plan Note (Signed)
Continue Aricept. He continues to decline. I do not think that some of family members fully understand the disease of dementia at one point in the conversation he asked about neurology being involved advised him I will schedule point with the neurologist but then they wanted to hold off on this. I think he is going to need a higher level care and what they can provide long-term. With regards to his constipation we will move back his Mira lax to 3 times a week to see if this helps with some of the incontinence episodes but this is a fine line as he does get severe constipation. He is due for some labs today I will go ahead and proceed with these. I also discussed if they have his end-of-life affairs in order such as his DNR, they were not sure of this   Patient was leaving the building they also states that he has recurrence of the fungal rash that was on his face a year ago like a refill on the Lotrisone. He has a few hypopigmented scaly spots in his beard region I refilled the Lotrisone

## 2015-07-05 NOTE — Assessment & Plan Note (Signed)
Blood pressure is well controlled on amlodipine

## 2015-07-05 NOTE — Progress Notes (Signed)
Patient ID: Kenneth Potter, male   DOB: 1926/05/20, 81 y.o.   MRN: [id number]   Subjective:    Patient ID: Kenneth Potter, male    DOB: 09/30/1926, 81 y.o.   MRN: [id number]  Patient presents for Follow Up  Pt here for f/u, they are concerned he continues to eat sweets all day. DM- blood sugars have been good, diet controlled, Dementia-  he has urinary incontinence and fecal incontinence, they often have to repeat themselves throughout the day, one daughter was concerned the prilsoec was causing his memory to be worse. He is taking the Aricept as prescribed. He naps a lot during the day therefore does not want to sleep at night. His daughter at the end of the visit states that now he starts to wander at times. They do not have any type of medical alert bracelet. They're also concerned about the care is too much for 2 people to do unfortunately for the children live outside of the state therefore cannot help. At this point he does not qualify for Medicaid. They would like to have physical therapy set up again he seemed to do better with walking around the home and his drink when he had therapy. They continue to have difficulties with his bowels. Times he is very constipated other times he will have loose bowels. They've been cutting back the Lasix drip to try to get a better balance.    Review Of Systems:  GEN- denies fatigue, fever, weight loss,weakness, recent illness HEENT- denies eye drainage, change in vision, nasal discharge, CVS- denies chest pain, palpitations RESP- denies SOB, cough, wheeze ABD- denies N/V, change in stools, abd pain GU- denies dysuria, hematuria, dribbling, incontinence MSK- denies joint pain, muscle aches, injury Neuro- denies headache, dizziness, syncope, seizure activity       Objective:    BP 128/66 mmHg  Pulse 64  Temp(Src) 98.2 F (36.8 C) (Oral)  Resp 12  Ht 5\' 5"  (1.651 m)  Wt 126 lb (57.153 kg)  BMI 20.97 kg/m2 GEN- NAD, alert and oriented x2 HEENT-  PERRL, EOMI, non injected sclera, pink conjunctiva, MMM, oropharynx clear Neck- Supple, no  LAD CVS- RRR, no murmur RESP-CTAB,  ABD-NABS,soft,ND,NT EXT- No edema MSK- Decreased ROM Spine, knees, hips  Neuro- short shuffled gait, needs assistance with ambulation Pulses- Radial 2+    Assessment & Plan:      Problem List Items Addressed This Visit    Essential hypertension   DM (diabetes mellitus) type II controlled with renal manifestation - Primary   Relevant Orders   CBC with Differential/Platelet   Comprehensive metabolic panel   Hemoglobin A1c   Dementia   CAD (coronary artery disease)   Relevant Orders   LDL Cholesterol, Direct      Note: This dictation was prepared with Dragon dictation along with smaller phrase technology. Any transcriptional errors that result from this process are unintentional.

## 2015-07-05 NOTE — Assessment & Plan Note (Signed)
History of severe heartburn and non-medications I will continue to Prilosec

## 2015-07-05 NOTE — Assessment & Plan Note (Signed)
Mixed incontinence in setting of his dementia. I do not think that the urinary incontinence medications would be helpful to him especially in the setting of his dementia and his use of Aricept. He is wearing depends. I think he needs full support from the family or otherwise some outside care or he will end up in a nursing home. We will discuss with his power of attorney who is Kenneth Potter and now allocated some of his assess to see if he qualifies for further assistance with Medicaid.

## 2015-07-06 ENCOUNTER — Telehealth: Payer: Self-pay | Admitting: Family Medicine

## 2015-07-06 LAB — COMPREHENSIVE METABOLIC PANEL
ALBUMIN: 3.7 g/dL (ref 3.5–5.2)
ALT: 13 U/L (ref 0–53)
AST: 17 U/L (ref 0–37)
Alkaline Phosphatase: 68 U/L (ref 39–117)
BUN: 15 mg/dL (ref 6–23)
CALCIUM: 9.2 mg/dL (ref 8.4–10.5)
CHLORIDE: 102 meq/L (ref 96–112)
CO2: 27 mEq/L (ref 19–32)
Creat: 1.11 mg/dL (ref 0.50–1.35)
Glucose, Bld: 146 mg/dL — ABNORMAL HIGH (ref 70–99)
POTASSIUM: 3.9 meq/L (ref 3.5–5.3)
Sodium: 136 mEq/L (ref 135–145)
Total Bilirubin: 0.6 mg/dL (ref 0.2–1.2)
Total Protein: 6.6 g/dL (ref 6.0–8.3)

## 2015-07-06 LAB — LDL CHOLESTEROL, DIRECT: Direct LDL: 86 mg/dL

## 2015-07-06 LAB — CBC WITH DIFFERENTIAL/PLATELET
Basophils Absolute: 0.1 10*3/uL (ref 0.0–0.1)
Basophils Relative: 1 % (ref 0–1)
EOS ABS: 0.1 10*3/uL (ref 0.0–0.7)
Eosinophils Relative: 2 % (ref 0–5)
HEMATOCRIT: 39.4 % (ref 39.0–52.0)
Hemoglobin: 13.3 g/dL (ref 13.0–17.0)
LYMPHS ABS: 2.1 10*3/uL (ref 0.7–4.0)
Lymphocytes Relative: 35 % (ref 12–46)
MCH: 32.7 pg (ref 26.0–34.0)
MCHC: 33.8 g/dL (ref 30.0–36.0)
MCV: 96.8 fL (ref 78.0–100.0)
MONOS PCT: 7 % (ref 3–12)
MPV: 9.4 fL (ref 8.6–12.4)
Monocytes Absolute: 0.4 10*3/uL (ref 0.1–1.0)
NEUTROS PCT: 55 % (ref 43–77)
Neutro Abs: 3.2 10*3/uL (ref 1.7–7.7)
Platelets: 212 10*3/uL (ref 150–400)
RBC: 4.07 MIL/uL — ABNORMAL LOW (ref 4.22–5.81)
RDW: 13.9 % (ref 11.5–15.5)
WBC: 5.9 10*3/uL (ref 4.0–10.5)

## 2015-07-06 LAB — HEMOGLOBIN A1C
Hgb A1c MFr Bld: 6.6 % — ABNORMAL HIGH (ref <5.7)
MEAN PLASMA GLUCOSE: 143 mg/dL — AB (ref <117)

## 2015-07-06 MED ORDER — CLOTRIMAZOLE 1 % EX CREA: 1.0000 "application " | TOPICAL_CREAM | Freq: Two times a day (BID) | CUTANEOUS | Status: DC

## 2015-07-06 NOTE — Telephone Encounter (Signed)
Patient insurance does not cover Lotrisone cream.   Insurance prefers Lotrimin.   Prescription sent to pharmacy.   Call placed to patient and patient daughter Judson Roch made aware.

## 2015-07-06 NOTE — Telephone Encounter (Signed)
Patient was prescribed a cream by dr Buelah Manis to put on his face, it is too expensive daughter Judson Roch wants to know if there is something cheaper? [phone]

## 2015-07-06 NOTE — Addendum Note (Signed)
Addended by: [name] F on: 07/06/2015 01:28 PM   Modules accepted: Orders

## 2015-07-12 ENCOUNTER — Telehealth: Payer: Self-pay | Admitting: *Deleted

## 2015-07-12 NOTE — Telephone Encounter (Signed)
Received call from patient daughter [name].   Reports that patient has inguinal hernia to R side of groin.   States that patient continues to have issues with constipation, and hernia has grown larger in size. Patient daughter reports that bulging area approximately golf ball size, but patient only voices c/o pain with palpation of area.   MD please advise.

## 2015-07-12 NOTE — Telephone Encounter (Signed)
ntbs

## 2015-07-13 ENCOUNTER — Ambulatory Visit (INDEPENDENT_AMBULATORY_CARE_PROVIDER_SITE_OTHER): Payer: Medicare Other | Admitting: Physician Assistant

## 2015-07-13 ENCOUNTER — Encounter: Payer: Self-pay | Admitting: Physician Assistant

## 2015-07-13 VITALS — BP 100/58 | HR 60 | Temp 98.9°F | Resp 14 | Ht 65.0 in | Wt 126.0 lb

## 2015-07-13 DIAGNOSIS — K4090 Unilateral inguinal hernia, without obstruction or gangrene, not specified as recurrent: Secondary | ICD-10-CM

## 2015-07-13 DIAGNOSIS — I2510 Atherosclerotic heart disease of native coronary artery without angina pectoris: Secondary | ICD-10-CM | POA: Diagnosis not present

## 2015-07-13 NOTE — Progress Notes (Signed)
Patient ID: Kenneth Potter MRN: [id number], DOB: July 26, 1926, 81 y.o. Date of Encounter: 07/13/2015, 4:51 PM    Chief Complaint:  Chief Complaint  Patient presents with  . Hard Knot to R Side of Groin    states that there is a golf ball sized knot to R side of groin that daughters think is hernia- states that pt voices c/o pain with palpation     HPI: 81 y.o. year old AA male here with 2 of his daughters. One of them is a CNA and the other is a Marine scientist.  The one who is a CNA says that she provides care to him on a daily basis as far as helping him with using the restroom, cleaning his bottom etc. Therefore she does see that area on a daily basis. Says that she first noticed this "mass" about one month ago. Says at first it was just a small little protrusion but it has gotten a little larger recently. They state that the patient does not complain of any pain there unless they are pressing on it. No other complaints or concerns.     Home Meds:   Outpatient Prescriptions Prior to Visit  Medication Sig Dispense Refill  . acetaminophen (TYLENOL) 500 MG tablet Take 1 tablet (500 mg total) by mouth every 6 (six) hours as needed for moderate pain or fever. 30 tablet 0  . amLODipine (NORVASC) 5 MG tablet Take 1 tablet (5 mg total) by mouth daily. 30 tablet 6  . bisacodyl (DULCOLAX) 10 MG suppository Place 1 suppository (10 mg total) rectally daily as needed for moderate constipation. 12 suppository 0  . clotrimazole (LOTRIMIN) 1 % cream Apply 1 application topically 2 (two) times daily. 30 g 0  . donepezil (ARICEPT) 5 MG tablet Take 1 tablet (5 mg total) by mouth at bedtime. 30 tablet 6  . feeding supplement, GLUCERNA SHAKE, (GLUCERNA SHAKE) LIQD Take 237 mLs by mouth 3 (three) times daily between meals. 90 Can 6  . FIBER PO Take 1 tablet by mouth daily.    [name] omeprazole (PRILOSEC) 40 MG capsule take 1 capsule by mouth once daily for REFLUX 30 capsule 6  . polyethylene glycol powder  (GLYCOLAX/MIRALAX) powder Take 17 g by mouth daily. 3350 g 3  . simvastatin (ZOCOR) 20 MG tablet Take 1 tablet (20 mg total) by mouth at bedtime. 30 tablet 6   No facility-administered medications prior to visit.    Allergies:  Allergies  Allergen Reactions  . Codeine Nausea Only    Nausea per family  . Aspirin     Can not tolerate in high dosage      Review of Systems: See HPI for pertinent ROS. All other ROS negative.    Physical Exam: Blood pressure 100/58, pulse 60, temperature 98.9 F (37.2 C), temperature source Oral, resp. rate 14, height 5\' 5"  (1.651 m), weight 126 lb (57.153 kg)., Body mass index is 20.97 kg/(m^2). General:  Thin AAM. Appears in no acute distress. Neck: Supple. No thyromegaly. No lymphadenopathy. Lungs: Clear bilaterally to auscultation without wheezes, rales, or rhonchi. Breathing is unlabored. Heart: Regular rhythm. No murmurs, rubs, or gallops. Abdomen: Soft, non-distended with normoactive bowel sounds. No hepatomegaly. On the Right, At area of Internal Inguinal Canal, there is a "mass"/"protrusion"--that is approximately 1 inch diameter and raised/protrudes approx 1/2 inch.  With palpation, this is completely reducible.  Right scrotum appears larger than left, possibly secondary to hernia into scrotum.  Msk:  Strength and tone normal  for age. Extremities/Skin: Warm and dry.  Neuro: Alert and oriented X 3. Moves all extremities spontaneously. Gait is normal. CNII-XII grossly in tact. Psych:  Responds to questions appropriately with a normal affect.     ASSESSMENT AND PLAN:  81 y.o. year old male with  1. Unilateral inguinal hernia without obstruction or gangrene, recurrence not specified Discussed with patient and daughters that if this is a hernia, whether they would want to even pursue surgery. Probably would not pursue surgery unless this becomes incarcerated/medically necessary. However, they do want to go ahead and undergo some imaging to  make certain this is a hernia versus some other type mass. Will obtain ultrasound. Will f/u with them once we get that result.  - US Pelvis Limited; Future   Signed, Olean Ree [place], [place], [facility] 07/13/2015 4:51 PM

## 2015-07-13 NOTE — Telephone Encounter (Signed)
Spoke to daughter told her Father does need to be seen and made appt for today.

## 2015-07-19 ENCOUNTER — Ambulatory Visit (HOSPITAL_COMMUNITY)
Admission: RE | Admit: 2015-07-19 | Discharge: 2015-07-19 | Disposition: A | Discharge status: Home / Self Care | Payer: Medicare Other | Source: Ambulatory Visit | Attending: Physician Assistant | Admitting: Physician Assistant

## 2015-07-19 DIAGNOSIS — K4090 Unilateral inguinal hernia, without obstruction or gangrene, not specified as recurrent: Secondary | ICD-10-CM

## 2015-07-19 DIAGNOSIS — R1900 Intra-abdominal and pelvic swelling, mass and lump, unspecified site: Secondary | ICD-10-CM | POA: Diagnosis present

## 2015-07-20 ENCOUNTER — Telehealth: Payer: Self-pay | Admitting: Family Medicine

## 2015-07-20 NOTE — Telephone Encounter (Signed)
Returned call to [employer].   Requested orders to extend PT services 2x weekly x3 weeks for gait, exercise, safety and balance.   VO given.   MD to be made aware.

## 2015-07-20 NOTE — Telephone Encounter (Signed)
[facility] calling regarding a pt order for this patient  [phone]

## 2015-07-20 NOTE — Telephone Encounter (Signed)
Agree with above 

## 2015-07-22 DIAGNOSIS — E119 Type 2 diabetes mellitus without complications: Secondary | ICD-10-CM | POA: Diagnosis not present

## 2015-07-22 DIAGNOSIS — I2510 Atherosclerotic heart disease of native coronary artery without angina pectoris: Secondary | ICD-10-CM | POA: Diagnosis not present

## 2015-07-22 DIAGNOSIS — F0391 Unspecified dementia with behavioral disturbance: Secondary | ICD-10-CM | POA: Diagnosis not present

## 2015-07-22 DIAGNOSIS — R269 Unspecified abnormalities of gait and mobility: Secondary | ICD-10-CM | POA: Diagnosis not present

## 2015-08-16 ENCOUNTER — Telehealth: Payer: Self-pay | Admitting: Family Medicine

## 2015-08-16 DIAGNOSIS — K4090 Unilateral inguinal hernia, without obstruction or gangrene, not specified as recurrent: Secondary | ICD-10-CM

## 2015-08-16 NOTE — Telephone Encounter (Signed)
Call placed to [facility].   Reports that family and [name], pt POA has discussed hernia and would like surgical opinion.   Advised that MD does not feel that patient would benefit from surgery, but referral can be placed.   Referral ordered.

## 2015-08-16 NOTE — Telephone Encounter (Signed)
Patients daughter [name] calling regarding get referral to a surgeon if possible  Please call [phone] with any questions

## 2015-08-22 ENCOUNTER — Encounter (HOSPITAL_COMMUNITY): Payer: Self-pay | Admitting: *Deleted

## 2015-08-22 ENCOUNTER — Emergency Department (HOSPITAL_COMMUNITY): Payer: Medicare Other

## 2015-08-22 ENCOUNTER — Telehealth: Payer: Self-pay | Admitting: Family Medicine

## 2015-08-22 ENCOUNTER — Inpatient Hospital Stay (HOSPITAL_COMMUNITY)
Admission: EM | Admit: 2015-08-22 | Discharge: 2015-08-24 | DRG: 391 | Disposition: A | Discharge status: Home Health | Payer: Medicare Other | Attending: Internal Medicine | Admitting: Internal Medicine

## 2015-08-22 DIAGNOSIS — I10 Essential (primary) hypertension: Secondary | ICD-10-CM | POA: Diagnosis present

## 2015-08-22 DIAGNOSIS — Z87891 Personal history of nicotine dependence: Secondary | ICD-10-CM | POA: Diagnosis not present

## 2015-08-22 DIAGNOSIS — K6289 Other specified diseases of anus and rectum: Secondary | ICD-10-CM

## 2015-08-22 DIAGNOSIS — Z8249 Family history of ischemic heart disease and other diseases of the circulatory system: Secondary | ICD-10-CM | POA: Diagnosis not present

## 2015-08-22 DIAGNOSIS — N058 Unspecified nephritic syndrome with other morphologic changes: Secondary | ICD-10-CM

## 2015-08-22 DIAGNOSIS — R0789 Other chest pain: Secondary | ICD-10-CM | POA: Diagnosis present

## 2015-08-22 DIAGNOSIS — K51211 Ulcerative (chronic) proctitis with rectal bleeding: Secondary | ICD-10-CM | POA: Diagnosis not present

## 2015-08-22 DIAGNOSIS — E782 Mixed hyperlipidemia: Secondary | ICD-10-CM | POA: Diagnosis present

## 2015-08-22 DIAGNOSIS — K51219 Ulcerative (chronic) proctitis with unspecified complications: Secondary | ICD-10-CM | POA: Diagnosis not present

## 2015-08-22 DIAGNOSIS — R109 Unspecified abdominal pain: Secondary | ICD-10-CM

## 2015-08-22 DIAGNOSIS — Z8546 Personal history of malignant neoplasm of prostate: Secondary | ICD-10-CM | POA: Diagnosis not present

## 2015-08-22 DIAGNOSIS — K529 Noninfective gastroenteritis and colitis, unspecified: Principal | ICD-10-CM | POA: Diagnosis present

## 2015-08-22 DIAGNOSIS — Z6823 Body mass index (BMI) 23.0-23.9, adult: Secondary | ICD-10-CM | POA: Diagnosis not present

## 2015-08-22 DIAGNOSIS — R197 Diarrhea, unspecified: Secondary | ICD-10-CM | POA: Diagnosis present

## 2015-08-22 DIAGNOSIS — K4090 Unilateral inguinal hernia, without obstruction or gangrene, not specified as recurrent: Secondary | ICD-10-CM | POA: Diagnosis present

## 2015-08-22 DIAGNOSIS — R112 Nausea with vomiting, unspecified: Secondary | ICD-10-CM

## 2015-08-22 DIAGNOSIS — N179 Acute kidney failure, unspecified: Secondary | ICD-10-CM | POA: Diagnosis present

## 2015-08-22 DIAGNOSIS — F0390 Unspecified dementia without behavioral disturbance: Secondary | ICD-10-CM | POA: Diagnosis present

## 2015-08-22 DIAGNOSIS — Z833 Family history of diabetes mellitus: Secondary | ICD-10-CM

## 2015-08-22 DIAGNOSIS — E1129 Type 2 diabetes mellitus with other diabetic kidney complication: Secondary | ICD-10-CM | POA: Diagnosis present

## 2015-08-22 DIAGNOSIS — E43 Unspecified severe protein-calorie malnutrition: Secondary | ICD-10-CM | POA: Diagnosis present

## 2015-08-22 LAB — URINALYSIS, ROUTINE W REFLEX MICROSCOPIC
Bilirubin Urine: NEGATIVE
GLUCOSE, UA: NEGATIVE mg/dL
KETONES UR: NEGATIVE mg/dL
Nitrite: NEGATIVE
PH: 6.5 (ref 5.0–8.0)
Specific Gravity, Urine: 1.02 (ref 1.005–1.030)
Urobilinogen, UA: 1 mg/dL (ref 0.0–1.0)

## 2015-08-22 LAB — COMPREHENSIVE METABOLIC PANEL
ALT: 11 U/L — AB (ref 17–63)
ANION GAP: 8 (ref 5–15)
AST: 20 U/L (ref 15–41)
Albumin: 4.1 g/dL (ref 3.5–5.0)
Alkaline Phosphatase: 60 U/L (ref 38–126)
BUN: 23 mg/dL — ABNORMAL HIGH (ref 6–20)
CHLORIDE: 103 mmol/L (ref 101–111)
CO2: 27 mmol/L (ref 22–32)
Calcium: 9.1 mg/dL (ref 8.9–10.3)
Creatinine, Ser: 1.42 mg/dL — ABNORMAL HIGH (ref 0.61–1.24)
GFR calc non Af Amer: 43 mL/min — ABNORMAL LOW (ref >60)
GFR, EST AFRICAN AMERICAN: 50 mL/min — AB (ref >60)
Glucose, Bld: 105 mg/dL — ABNORMAL HIGH (ref 65–99)
POTASSIUM: 3.7 mmol/L (ref 3.5–5.1)
SODIUM: 138 mmol/L (ref 135–145)
Total Bilirubin: 1.2 mg/dL (ref 0.3–1.2)
Total Protein: 7.5 g/dL (ref 6.5–8.1)

## 2015-08-22 LAB — CBC WITH DIFFERENTIAL/PLATELET
Basophils Absolute: 0 10*3/uL (ref 0.0–0.1)
Basophils Relative: 0 % (ref 0–1)
EOS ABS: 0.1 10*3/uL (ref 0.0–0.7)
EOS PCT: 1 % (ref 0–5)
HCT: 42.3 % (ref 39.0–52.0)
Hemoglobin: 14.9 g/dL (ref 13.0–17.0)
LYMPHS ABS: 2.6 10*3/uL (ref 0.7–4.0)
Lymphocytes Relative: 28 % (ref 12–46)
MCH: 34 pg (ref 26.0–34.0)
MCHC: 35.2 g/dL (ref 30.0–36.0)
MCV: 96.6 fL (ref 78.0–100.0)
MONO ABS: 0.7 10*3/uL (ref 0.1–1.0)
Monocytes Relative: 8 % (ref 3–12)
Neutro Abs: 5.9 10*3/uL (ref 1.7–7.7)
Neutrophils Relative %: 63 % (ref 43–77)
PLATELETS: 171 10*3/uL (ref 150–400)
RBC: 4.38 MIL/uL (ref 4.22–5.81)
RDW: 13.1 % (ref 11.5–15.5)
WBC: 9.3 10*3/uL (ref 4.0–10.5)

## 2015-08-22 LAB — GLUCOSE, CAPILLARY: GLUCOSE-CAPILLARY: 105 mg/dL — AB (ref 65–99)

## 2015-08-22 LAB — URINE MICROSCOPIC-ADD ON

## 2015-08-22 LAB — TROPONIN I
Troponin I: <0.03 ng/mL (ref <0.031)
Troponin I: <0.03 ng/mL (ref <0.031)

## 2015-08-22 LAB — LIPASE, BLOOD: LIPASE: 13 U/L — AB (ref 22–51)

## 2015-08-22 MED ORDER — AMLODIPINE BESYLATE 5 MG PO TABS
5.0000 mg | ORAL_TABLET | Freq: Every day | ORAL | Status: DC
  Administered 2015-08-23 – 2015-08-24 (×2): 5 mg via ORAL
  Filled 2015-08-22 (×2): qty 1

## 2015-08-22 MED ORDER — ONDANSETRON HCL 4 MG/2ML IJ SOLN: 4.0000 mg | Freq: Four times a day (QID) | INTRAMUSCULAR | Status: DC | PRN

## 2015-08-22 MED ORDER — INSULIN ASPART 100 UNIT/ML ~~LOC~~ SOLN
0.0000 [IU] | SUBCUTANEOUS | Status: DC
  Administered 2015-08-23 (×2): 2 [IU] via SUBCUTANEOUS
  Administered 2015-08-23 – 2015-08-24 (×3): 1 [IU] via SUBCUTANEOUS

## 2015-08-22 MED ORDER — IOHEXOL 300 MG/ML  SOLN
50.0000 mL | Freq: Once | INTRAMUSCULAR | Status: AC | PRN
  Administered 2015-08-22: 50 mL via ORAL

## 2015-08-22 MED ORDER — SIMVASTATIN 20 MG PO TABS
20.0000 mg | ORAL_TABLET | Freq: Every day | ORAL | Status: DC
  Administered 2015-08-22 – 2015-08-23 (×2): 20 mg via ORAL
  Filled 2015-08-22 (×2): qty 1

## 2015-08-22 MED ORDER — METRONIDAZOLE IN NACL 5-0.79 MG/ML-% IV SOLN
500.0000 mg | Freq: Once | INTRAVENOUS | Status: AC
  Administered 2015-08-22: 500 mg via INTRAVENOUS
  Filled 2015-08-22: qty 100

## 2015-08-22 MED ORDER — SODIUM CHLORIDE 0.9 % IJ SOLN
3.0000 mL | Freq: Two times a day (BID) | INTRAMUSCULAR | Status: DC
  Administered 2015-08-22 – 2015-08-23 (×2): 3 mL via INTRAVENOUS

## 2015-08-22 MED ORDER — CIPROFLOXACIN IN D5W 400 MG/200ML IV SOLN
400.0000 mg | Freq: Once | INTRAVENOUS | Status: AC
  Administered 2015-08-22: 400 mg via INTRAVENOUS
  Filled 2015-08-22: qty 200

## 2015-08-22 MED ORDER — ONDANSETRON HCL 4 MG PO TABS: 4.0000 mg | ORAL_TABLET | Freq: Four times a day (QID) | ORAL | Status: DC | PRN

## 2015-08-22 MED ORDER — PANTOPRAZOLE SODIUM 40 MG PO TBEC
80.0000 mg | DELAYED_RELEASE_TABLET | Freq: Every day | ORAL | Status: DC
  Administered 2015-08-23: 80 mg via ORAL
  Filled 2015-08-22: qty 2

## 2015-08-22 MED ORDER — CIPROFLOXACIN IN D5W 400 MG/200ML IV SOLN
400.0000 mg | Freq: Two times a day (BID) | INTRAVENOUS | Status: DC
  Administered 2015-08-23 – 2015-08-24 (×3): 400 mg via INTRAVENOUS
  Filled 2015-08-22 (×3): qty 200

## 2015-08-22 MED ORDER — MORPHINE SULFATE (PF) 2 MG/ML IV SOLN: 1.0000 mg | INTRAVENOUS | Status: DC | PRN

## 2015-08-22 MED ORDER — ONDANSETRON HCL 4 MG/2ML IJ SOLN
4.0000 mg | Freq: Once | INTRAMUSCULAR | Status: AC
Start: 2015-08-22 — End: 2015-08-22
  Administered 2015-08-22: 4 mg via INTRAVENOUS
  Filled 2015-08-22: qty 2

## 2015-08-22 MED ORDER — SODIUM CHLORIDE 0.9 % IV SOLN
Freq: Once | INTRAVENOUS | Status: AC
  Administered 2015-08-22: 19:00:00 via INTRAVENOUS

## 2015-08-22 MED ORDER — KCL IN DEXTROSE-NACL 20-5-0.9 MEQ/L-%-% IV SOLN
INTRAVENOUS | Status: AC
  Filled 2015-08-22: qty 1000

## 2015-08-22 MED ORDER — SODIUM CHLORIDE 0.9 % IV BOLUS (SEPSIS)
500.0000 mL | Freq: Once | INTRAVENOUS | Status: AC
  Administered 2015-08-22: 500 mL via INTRAVENOUS

## 2015-08-22 MED ORDER — KCL IN DEXTROSE-NACL 20-5-0.9 MEQ/L-%-% IV SOLN
INTRAVENOUS | Status: DC
  Administered 2015-08-22 – 2015-08-24 (×2): via INTRAVENOUS

## 2015-08-22 MED ORDER — METRONIDAZOLE IN NACL 5-0.79 MG/ML-% IV SOLN
500.0000 mg | Freq: Three times a day (TID) | INTRAVENOUS | Status: DC
  Administered 2015-08-23 – 2015-08-24 (×4): 500 mg via INTRAVENOUS
  Filled 2015-08-22 (×5): qty 100

## 2015-08-22 MED ORDER — IOHEXOL 300 MG/ML  SOLN: 100.0000 mL | Freq: Once | INTRAMUSCULAR | Status: DC | PRN

## 2015-08-22 MED ORDER — DONEPEZIL HCL 5 MG PO TABS
5.0000 mg | ORAL_TABLET | Freq: Every day | ORAL | Status: DC
  Administered 2015-08-22 – 2015-08-23 (×2): 5 mg via ORAL
  Filled 2015-08-22 (×2): qty 1
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
## 2015-08-22 NOTE — Telephone Encounter (Signed)
Pt had bloody diarrhea all day yesterday and daughter said has seen blood in urine as well.  Told her family needs to carry him to ED ASAP.  (did not want to call 911 due to expense)
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
## 2015-08-22 NOTE — ED Notes (Signed)
Pt resting in bed, continues to ask about going home, pt and family updated on plan of care,

## 2015-08-22 NOTE — ED Notes (Signed)
Patient reports hematuria since yesterday, reports pain in lower abdomen at times.

## 2015-08-22 NOTE — ED Notes (Signed)
Report given to floor,  

## 2015-08-22 NOTE — Telephone Encounter (Signed)
Noted agree with ER visit

## 2015-08-22 NOTE — ED Notes (Signed)
Dr Le at bedside,  

## 2015-08-23 ENCOUNTER — Encounter (HOSPITAL_COMMUNITY): Payer: Self-pay | Admitting: Gastroenterology

## 2015-08-23 DIAGNOSIS — N179 Acute kidney failure, unspecified: Secondary | ICD-10-CM | POA: Diagnosis present

## 2015-08-23 DIAGNOSIS — R197 Diarrhea, unspecified: Secondary | ICD-10-CM

## 2015-08-23 LAB — CBC
HEMATOCRIT: 37.8 % — AB (ref 39.0–52.0)
Hemoglobin: 13.2 g/dL (ref 13.0–17.0)
MCH: 34 pg (ref 26.0–34.0)
MCHC: 34.9 g/dL (ref 30.0–36.0)
MCV: 97.4 fL (ref 78.0–100.0)
Platelets: 149 10*3/uL — ABNORMAL LOW (ref 150–400)
RBC: 3.88 MIL/uL — ABNORMAL LOW (ref 4.22–5.81)
RDW: 13.2 % (ref 11.5–15.5)
WBC: 7.6 10*3/uL (ref 4.0–10.5)

## 2015-08-23 LAB — COMPREHENSIVE METABOLIC PANEL
ALBUMIN: 3.4 g/dL — AB (ref 3.5–5.0)
ALT: 10 U/L — ABNORMAL LOW (ref 17–63)
AST: 17 U/L (ref 15–41)
Alkaline Phosphatase: 49 U/L (ref 38–126)
Anion gap: 9 (ref 5–15)
BUN: 18 mg/dL (ref 6–20)
CHLORIDE: 107 mmol/L (ref 101–111)
CO2: 23 mmol/L (ref 22–32)
Calcium: 8.3 mg/dL — ABNORMAL LOW (ref 8.9–10.3)
Creatinine, Ser: 1.1 mg/dL (ref 0.61–1.24)
GFR calc Af Amer: >60 mL/min (ref >60)
GFR, EST NON AFRICAN AMERICAN: 58 mL/min — AB (ref >60)
Glucose, Bld: 124 mg/dL — ABNORMAL HIGH (ref 65–99)
POTASSIUM: 3.4 mmol/L — AB (ref 3.5–5.1)
Sodium: 139 mmol/L (ref 135–145)
Total Bilirubin: 0.9 mg/dL (ref 0.3–1.2)
Total Protein: 6.3 g/dL — ABNORMAL LOW (ref 6.5–8.1)

## 2015-08-23 LAB — GLUCOSE, CAPILLARY
GLUCOSE-CAPILLARY: 117 mg/dL — AB (ref 65–99)
GLUCOSE-CAPILLARY: 124 mg/dL — AB (ref 65–99)
GLUCOSE-CAPILLARY: 183 mg/dL — AB (ref 65–99)
Glucose-Capillary: 164 mg/dL — ABNORMAL HIGH (ref 65–99)
Glucose-Capillary: 60 mg/dL — ABNORMAL LOW (ref 65–99)
Glucose-Capillary: 73 mg/dL (ref 65–99)

## 2015-08-23 LAB — TROPONIN I: Troponin I: <0.03 ng/mL (ref <0.031)

## 2015-08-23 MED ORDER — POTASSIUM CHLORIDE CRYS ER 20 MEQ PO TBCR
20.0000 meq | EXTENDED_RELEASE_TABLET | Freq: Two times a day (BID) | ORAL | Status: AC
  Administered 2015-08-23 (×2): 20 meq via ORAL
  Filled 2015-08-23 (×2): qty 1

## 2015-08-23 MED ORDER — GLUCERNA SHAKE PO LIQD
237.0000 mL | Freq: Three times a day (TID) | ORAL | Status: DC
  Administered 2015-08-23: 237 mL via ORAL

## 2015-08-23 MED ORDER — PANTOPRAZOLE SODIUM 40 MG PO TBEC
40.0000 mg | DELAYED_RELEASE_TABLET | Freq: Every day | ORAL | Status: DC
Start: 2015-08-24 — End: 2015-08-24
  Administered 2015-08-24: 40 mg via ORAL
  Filled 2015-08-23: qty 1

## 2015-08-23 NOTE — Care Management Note (Signed)
Case Management Note  Patient Details  Name: Kenneth Potter MRN: [id number] Date of Birth: 12/05/1926  Subjective/Objective:                  Pt admitted from home with colitis/rectal bleed. Pt lives with his daughter, [name] and will return home at discharge. Pt stated that he has a cane and walker for home use. Pt seems a little confused and has given Cm permission to talk with pts daughter.  Action/Plan: Will continue to follow for discharge planning needs. VM left for pts daughter.  Expected Discharge Date:                  Expected Discharge Plan:  Home/Self Care  In-House Referral:  NA  Discharge planning Services  CM Consult  Post Acute Care Choice:  NA Choice offered to:  NA  DME Arranged:    DME Agency:     HH Arranged:    HH Agency:     Status of Service:  In process, will continue to follow  Medicare Important Message Given:    Date Medicare IM Given:    Medicare IM give by:    Date Additional Medicare IM Given:    Additional Medicare Important Message give by:     If discussed at [place] of Stay Meetings, dates discussed:    Additional Comments:  Joylene Draft, RN 08/23/2015, 2:53 PM

## 2015-08-23 NOTE — Progress Notes (Signed)
The patient is an 81 year old man with a history of CAD, DM, HTN, and dementia, who was admitted this morning by Dr. Marin Comment for diarrhea. Patient was briefly seen and examined. His chart, vital signs, laboratory studies were reviewed. CT scan of his abdomen and pelvis on admission revealed proctocolitis. He was started on Cipro and Flagyl. GI has consulted and their assessment and recommendations noted and appreciated.  -Diet advanced by GI. -We will supplement with oral potassium today. -C. difficile PCR and stool studies pending.
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
## 2015-08-23 NOTE — Progress Notes (Addendum)
Initial Nutrition Assessment  INTERVENTION:  Glucerna Shake po TID, each supplement provides 220 kcal and 10 grams of protein    NUTRITION DIAGNOSIS:   Inadequate oral intake related to acute illness as evidenced by other (see comment) (colitis, AKI).  GOAL:   Patient will meet greater than or equal to 90% of their needs   MONITOR:   Diet advancement, Weight trends, PO intake  REASON FOR ASSESSMENT:   Malnutrition Screening Tool    ASSESSMENT:  81 y.o. male with h/o HTN, HLD, early dementia, CAD, DM and lives with his daughter. He is unable to provide usual meal pattern but says he is hungry and is hoping to go home. No BM today. His diet has advanced now. Will continue to follow.   Diet Order:  DIET DYS 2 Room service appropriate?: Yes; Fluid consistency:: Thin  Skin:   dry, flaky   Last BM:   8/22  Height:   Ht Readings from Last 1 Encounters:  08/22/15 5\' 6"  (1.676 m)    Weight:   Wt Readings from Last 1 Encounters:  08/22/15 144 lb 10 oz (65.6 kg)    Ideal Body Weight:  64.5 kg  BMI:  Body mass index is 23.35 kg/(m^2).  Estimated Nutritional Needs:   Kcal:  5597-4163  Protein:  78-89 gr  Fluid:  >1500 ml daily  EDUCATION NEEDS:   No education needs identified at this time  Colman Cater MS,RD,CSG,LDN Office: #[phone] Pager: [phone]

## 2015-08-24 DIAGNOSIS — R0789 Other chest pain: Secondary | ICD-10-CM

## 2015-08-24 DIAGNOSIS — N179 Acute kidney failure, unspecified: Secondary | ICD-10-CM

## 2015-08-24 DIAGNOSIS — K529 Noninfective gastroenteritis and colitis, unspecified: Secondary | ICD-10-CM | POA: Insufficient documentation

## 2015-08-24 DIAGNOSIS — K51211 Ulcerative (chronic) proctitis with rectal bleeding: Secondary | ICD-10-CM

## 2015-08-24 DIAGNOSIS — K51219 Ulcerative (chronic) proctitis with unspecified complications: Secondary | ICD-10-CM

## 2015-08-24 LAB — GLUCOSE, CAPILLARY
GLUCOSE-CAPILLARY: 107 mg/dL — AB (ref 65–99)
GLUCOSE-CAPILLARY: 149 mg/dL — AB (ref 65–99)
Glucose-Capillary: 120 mg/dL — ABNORMAL HIGH (ref 65–99)
Glucose-Capillary: 127 mg/dL — ABNORMAL HIGH (ref 65–99)

## 2015-08-24 MED ORDER — METRONIDAZOLE 500 MG PO TABS: 500.0000 mg | ORAL_TABLET | Freq: Three times a day (TID) | ORAL | Status: DC

## 2015-08-24 MED ORDER — CIPROFLOXACIN HCL 500 MG PO TABS: 500.0000 mg | ORAL_TABLET | Freq: Two times a day (BID) | ORAL | Status: DC

## 2015-08-24 MED ORDER — POLYETHYLENE GLYCOL 3350 17 GM/SCOOP PO POWD: 17.0000 g | Freq: Every day | ORAL | Status: DC | PRN

## 2015-08-24 MED ORDER — CIPROFLOXACIN HCL 250 MG PO TABS: 500.0000 mg | ORAL_TABLET | Freq: Two times a day (BID) | ORAL | Status: DC

## 2015-08-24 MED ORDER — CIPROFLOXACIN HCL 500 MG PO TABS
500.0000 mg | ORAL_TABLET | Freq: Two times a day (BID) | ORAL | Status: DC
Start: 2015-08-24 — End: 2015-08-24

## 2015-08-24 NOTE — Plan of Care (Signed)
Problem: Discharge Progression Outcomes Goal: Pain controlled with appropriate interventions Outcome: Completed/Met Date Met:  08/24/15 denies     

## 2015-08-24 NOTE — Care Management Important Message (Signed)
Important Message  Patient Details  Name: AUSTAN NICHOLL MRN: [id number] Date of Birth: 01/13/1926   Medicare Important Message Given:  N/A - LOS <3 / Initial given by admissions    Joylene Draft, RN 08/24/2015, 10:55 AM

## 2015-08-24 NOTE — Discharge Summary (Signed)
Physician Discharge Summary  Kenneth Potter DTO:[id number] DOB: July 24, 1926 DOA: 08/22/2015  PCP: [name], MD  Admit date: 08/22/2015 Discharge date: 08/24/2015  Time spent: 30 minutes  Recommendations for Outpatient Follow-up:  1. Follow up with GI for outpatient colonoscopy in 4-6 weeks  2. Continue Cipro and Flagyl  3. Follow up with primary care doctor in 2 weeks  Discharge Diagnoses:  Principal Problem:   Proctocolitis, likely infectious  Active Problems:   Essential hypertension   DM (diabetes mellitus) type II controlled with renal manifestation   Dementia   Protein-calorie malnutrition, severe   Atypical chest pain   Proctocolitis   AKI (acute kidney injury)   Colitis   Discharge Condition: Improved   Diet recommendation: Regular  Filed Weights   08/22/15 1528 08/22/15 2119  Weight: 65.772 kg (145 lb) 65.6 kg (144 lb 10 oz)    History of present illness:   81 y.o. male with hx of HTN, HLD, early dementia, CAD, DM, hx of bradycardia, presented with bloody diarrhea, moderate lower abdominal pain and cramps. He also complained of left sided non pleuritic CP with no SOB.Evaluation in the ER showed no leukocytosis with Hb of 14 g per dL, BUN 23, Cr 1.4, negative troponin and EKG showed NSR with no acute ST T changes. His CXR was clear. His abdominal pelvic CT with contrast showed distal colitis, no absesses, and non-obstructive kidney stone. His UA was negative. Although hematuria was reported, it was not confirmed, and unlikely given his urine was totally clear in the ER and UA showed no RBC.He was given IV Cipro and Flagyl, admitted for colitis, and atypical chest pain.   Hospital Course:  CT A/P revealed proctocolitis in which Flagyl and Cipro was given with instruction to continue as directed upon discharge. Pt did not have significant diarrhea in the hospital and he did not produce enough stool for a sample to send for stool studies. Diarrhea has appeared to  resolve since admission. He does not have any fever or leukocytosis and Hgb has been stable. He was seen by GI, who recommended outpatient colonoscopy in 4-6 weeks. They agreed with continuing abxs. Since the pt is no longer having diarrhea and is tolerating solid diet, will plan on discharge home later today. Hold PPI for now, until diarrhea resolves. The remainder of medical issues remained stable.  1. Atypical chest pain. Ruled out for ACS with negative cardiac markers., no further chest pain. 2. Dementia. Stable during hospitalization  3. DM Type 2. Stable during hospitalization was given SSI. Continue outpatient regimen on discharge  Procedures:  None  Consultations:  GI   Discharge Exam: Filed Vitals:   08/24/15 0619  BP: 170/85  Pulse: 53  Temp: 97.8 F (36.6 C)  Resp: 20     General: NAD, looks comfortable  Cardiovascular: RRR, S1, S2   Respiratory: clear bilaterally, No wheezing, rales or rhnochi   Discharge Instructions   Discharge Instructions    Diet - low sodium heart healthy    Complete by:  As directed      Increase activity slowly    Complete by:  As directed           Current Discharge Medication List    START taking these medications   Details  ciprofloxacin (CIPRO) 500 MG tablet Take 1 tablet (500 mg total) by mouth 2 (two) times daily. Qty: 18 tablet, Refills: 0    metroNIDAZOLE (FLAGYL) 500 MG tablet Take 1 tablet (500 mg total) by  mouth every 8 (eight) hours. Qty: 27 tablet, Refills: 0      CONTINUE these medications which have CHANGED   Details  polyethylene glycol powder (GLYCOLAX/MIRALAX) powder Take 17 g by mouth daily as needed for mild constipation. Qty: 3350 g, Refills: 3      CONTINUE these medications which have NOT CHANGED   Details  acetaminophen (TYLENOL) 500 MG tablet Take 1 tablet (500 mg total) by mouth every 6 (six) hours as needed for moderate pain or fever. Qty: 30 tablet, Refills: 0    amLODipine (NORVASC) 5 MG  tablet Take 1 tablet (5 mg total) by mouth daily. Qty: 30 tablet, Refills: 6    bisacodyl (DULCOLAX) 10 MG suppository Place 1 suppository (10 mg total) rectally daily as needed for moderate constipation. Qty: 12 suppository, Refills: 0    clotrimazole (LOTRIMIN) 1 % cream Apply 1 application topically 2 (two) times daily. Qty: 30 g, Refills: 0    donepezil (ARICEPT) 5 MG tablet Take 1 tablet (5 mg total) by mouth at bedtime. Qty: 30 tablet, Refills: 6    feeding supplement, GLUCERNA SHAKE, (GLUCERNA SHAKE) LIQD Take 237 mLs by mouth 3 (three) times daily between meals. Qty: 90 Can, Refills: 6    FIBER PO Take 1 tablet by mouth daily.    simvastatin (ZOCOR) 20 MG tablet Take 1 tablet (20 mg total) by mouth at bedtime. Qty: 30 tablet, Refills: 6      STOP taking these medications     omeprazole (PRILOSEC) 40 MG capsule        Allergies  Allergen Reactions  . Codeine Nausea Only    Nausea per family  . Aspirin     Can not tolerate in high dosage   Follow-up Information    Follow up with [facility].   Contact information:   [address] [place] [address] [phone]        The results of significant diagnostics from this hospitalization (including imaging, microbiology, ancillary and laboratory) are listed below for reference.    Significant Diagnostic Studies: Ct Abdomen Pelvis Wo Contrast  08/22/2015   CLINICAL DATA:  Bloody diarrhea yesterday, hematuria, lower abdominal pain, history benign essential hypertension, mixed hyperlipidemia, coronary artery disease, diabetes mellitus, prostate cancer, dementia, GERD  EXAM: CT ABDOMEN AND PELVIS WITHOUT CONTRAST  TECHNIQUE: Multidetector CT imaging of the abdomen and pelvis was performed following the standard protocol without IV contrast. Oral contrast was administered for exam. Sagittal and coronal MPR images reconstructed from axial data set.  COMPARISON:  02/24/2015  FINDINGS: Minimal  atelectasis at lung bases.  BILATERAL renal cysts measuring up to 6.8 x 6.7 cm LEFT kidney image 26 and 3.1 x 3.3 cm RIGHT kidney image 16.  Tiny nonobstructing calculus upper pole LEFT kidney.  RIGHT lobe hepatic cyst 4.0 x 3.2 cm image 21.  Liver, spleen, pancreas, kidneys, and adrenal glands otherwise unremarkable.  Mild bladder wall thickening with low attenuation within prostate gland question prior TURP.  RIGHT inguinal hernia containing nonobstructed small bowel.  Gallbladder surgically absent with nonvisualization of appendix.  Marked wall thickening of rectum and mid to distal sigmoid colon, length of involvement favoring proctitis/colitis rather than tumor.  Stomach and remaining bowel loops unremarkable.  No mass, adenopathy, free air or free fluid.  Scattered atherosclerotic calcification.  Bones demineralized with chronic L2 compression deformity and grade 1 anterolisthesis at L4-L5 and L3-L4.  IMPRESSION: Marked wall thickening of rectum and mid to distal sigmoid colon favoring colitis,  either from infection or inflammatory bowel disease ; recommend correlation with endoscopy to definitively exclude tumor.  RIGHT inguinal hernia containing nonobstructed small bowel.  Hepatic and BILATERAL renal cysts.  Prior cholecystectomy and suspect TURP.   Electronically Signed   By: Lavonia Dana M.D.   On: 08/22/2015 18:26   Dg Chest 1 View  08/22/2015   CLINICAL DATA:  LOWER ABDOMINAL PAIN.  VOMITING AND DIARRHEA.  EXAM: CHEST  1 VIEW  COMPARISON:  02/24/2015  FINDINGS: The heart size and mediastinal contours are within normal limits. Both lungs are clear. Calcified granuloma noted in the right upper lobe. The visualized skeletal structures are unremarkable.  IMPRESSION: No active disease.   Electronically Signed   By: Kerby Moors M.D.   On: 08/22/2015 18:37    Microbiology: No results found for this or any previous visit (from the past 240 hour(s)).   Labs: Basic Metabolic Panel:  Recent Labs Lab  08/22/15 1652 08/23/15 0437  NA 138 139  K 3.7 3.4*  CL 103 107  CO2 27 23  GLUCOSE 105* 124*  BUN 23* 18  CREATININE 1.42* 1.10  CALCIUM 9.1 8.3*   Liver Function Tests:  Recent Labs Lab 08/22/15 1652 08/23/15 0437  AST 20 17  ALT 11* 10*  ALKPHOS 60 49  BILITOT 1.2 0.9  PROT 7.5 6.3*  ALBUMIN 4.1 3.4*    Recent Labs Lab 08/22/15 1652  LIPASE 13*   No results for input(s): AMMONIA in the last 168 hours. CBC:  Recent Labs Lab 08/22/15 1652 08/23/15 0437  WBC 9.3 7.6  NEUTROABS 5.9  --   HGB 14.9 13.2  HCT 42.3 37.8*  MCV 96.6 97.4  PLT 171 149*   Cardiac Enzymes:  Recent Labs Lab 08/22/15 1652 08/22/15 2204 08/23/15 0437 08/23/15 1000  TROPONINI <0.03 <0.03 <0.03 <0.03   BNP: BNP (last 3 results) No results for input(s): BNP in the last 8760 hours.  ProBNP (last 3 results)  Recent Labs  09/01/14 1425  PROBNP 470.8*    CBG:  Recent Labs Lab 08/23/15 1802 08/23/15 2102 08/24/15 0015 08/24/15 0423 08/24/15 0742  GLUCAP 73 183* 107* 120* 127*       Signed:  Kathie Dike, MD [facility] 08/24/2015, 10:58 AM   I, Laban Emperor. Leonie Green, acting as scribe, recorded this note contemporaneously in the presence of Dr. Kathie Dike, M.D. on 08/24/2015.   I have reviewed the above documentation for accuracy and completeness, and I agree with the above.  MEMON,JEHANZEB

## 2015-08-24 NOTE — Care Management Note (Signed)
Case Management Note  Patient Details  Name: Kenneth Potter MRN: [id number] Date of Birth: 11-03-1926  Subjective/Objective:                    Action/Plan:   Expected Discharge Date:                  Expected Discharge Plan:  Home/Self Care  In-House Referral:  NA  Discharge planning Services  CM Consult  Post Acute Care Choice:  NA Choice offered to:  NA  DME Arranged:    DME Agency:     HH Arranged:  RN, PT [place] Agency:  [facility]  Status of Service:  Completed, signed off  Medicare Important Message Given:  N/A - LOS <3 / Initial given by admissions Date Medicare IM Given:    Medicare IM give by:    Date Additional Medicare IM Given:    Additional Medicare Important Message give by:     If discussed at [place] of Stay Meetings, dates discussed:    Additional Comments: Pt discharged home today with [facility] RN and PT (per pts daughter choice). Referral called to [facility] with [facility] and he will collect pts information from the chart. [facility] services to start within 48 hours of discharge. No DME needs noted. Pts daughter and pts nurse aware of discharge arrangements. Christinia Gully [place], RN 08/24/2015, 10:56 AM

## 2015-08-24 NOTE — Progress Notes (Signed)
Subjective:  No complaints  Objective: Vital signs in last 24 hours: Temp:  [97.8 F (36.6 C)-98.1 F (36.7 C)] 97.8 F (36.6 C) (08/24 0619) Pulse Rate:  [51-64] 53 (08/24 0619) Resp:  [20] 20 (08/24 0619) BP: (104-170)/(70-85) 170/85 mmHg (08/24 0619) SpO2:  [97 %-98 %] 97 % (08/24 0619) Last BM Date: 08/22/15 General:   Alert,  Well-developed, well-nourished, pleasant and cooperative in NAD Head:  Normocephalic and atraumatic. Eyes:  Sclera clear, no icterus.  Chest: CTA bilaterally without rales, rhonchi, crackles.    Heart:  Regular rate and rhythm; no murmurs, clicks, rubs,  or gallops. Abdomen:  Soft, nontender and nondistended. No masses, hepatosplenomegaly or hernias noted. Normal bowel sounds, without guarding, and without rebound.   Extremities:  Without clubbing, deformity or edema. Neurologic:  Alert and  oriented x4;  grossly normal neurologically. Skin:  Intact without significant lesions or rashes. Psych:  Alert and cooperative. Normal mood and affect.  Intake/Output from previous day: 08/23 0701 - 08/24 0700 In: 2576.7 [P.O.:1200; I.V.:976.7; IV Piggyback:400] Out: 1350 [Urine:1350] Intake/Output this shift: Total I/O In: -  Out: 400 [Urine:400]  Lab Results: CBC  Recent Labs  08/22/15 1652 08/23/15 0437  WBC 9.3 7.6  HGB 14.9 13.2  HCT 42.3 37.8*  MCV 96.6 97.4  PLT 171 149*   BMET  Recent Labs  08/22/15 1652 08/23/15 0437  NA 138 139  K 3.7 3.4*  CL 103 107  CO2 27 23  GLUCOSE 105* 124*  BUN 23* 18  CREATININE 1.42* 1.10  CALCIUM 9.1 8.3*   LFTs  Recent Labs  08/22/15 1652 08/23/15 0437  BILITOT 1.2 0.9  ALKPHOS 60 49  AST 20 17  ALT 11* 10*  PROT 7.5 6.3*  ALBUMIN 4.1 3.4*    Recent Labs  08/22/15 1652  LIPASE 13*   PT/INR No results for input(s): LABPROT, INR in the last 72 hours.    Imaging Studies: Ct Abdomen Pelvis Wo Contrast  08/22/2015   CLINICAL DATA:  Bloody diarrhea yesterday, hematuria,  lower abdominal pain, history benign essential hypertension, mixed hyperlipidemia, coronary artery disease, diabetes mellitus, prostate cancer, dementia, GERD  EXAM: CT ABDOMEN AND PELVIS WITHOUT CONTRAST  TECHNIQUE: Multidetector CT imaging of the abdomen and pelvis was performed following the standard protocol without IV contrast. Oral contrast was administered for exam. Sagittal and coronal MPR images reconstructed from axial data set.  COMPARISON:  02/24/2015  FINDINGS: Minimal atelectasis at lung bases.  BILATERAL renal cysts measuring up to 6.8 x 6.7 cm LEFT kidney image 26 and 3.1 x 3.3 cm RIGHT kidney image 16.  Tiny nonobstructing calculus upper pole LEFT kidney.  RIGHT lobe hepatic cyst 4.0 x 3.2 cm image 21.  Liver, spleen, pancreas, kidneys, and adrenal glands otherwise unremarkable.  Mild bladder wall thickening with low attenuation within prostate gland question prior TURP.  RIGHT inguinal hernia containing nonobstructed small bowel.  Gallbladder surgically absent with nonvisualization of appendix.  Marked wall thickening of rectum and mid to distal sigmoid colon, length of involvement favoring proctitis/colitis rather than tumor.  Stomach and remaining bowel loops unremarkable.  No mass, adenopathy, free air or free fluid.  Scattered atherosclerotic calcification.  Bones demineralized with chronic L2 compression deformity and grade 1 anterolisthesis at L4-L5 and L3-L4.  IMPRESSION: Marked wall thickening of rectum and mid to distal sigmoid colon favoring colitis, either from infection or inflammatory bowel disease ; recommend correlation with endoscopy to definitively exclude tumor.  RIGHT inguinal hernia  containing nonobstructed small bowel.  Hepatic and BILATERAL renal cysts.  Prior cholecystectomy and suspect TURP.   Electronically Signed   By: Lavonia Dana M.D.   On: 08/22/2015 18:26   Dg Chest 1 View  08/22/2015   CLINICAL DATA:  LOWER ABDOMINAL PAIN.  VOMITING AND DIARRHEA.  EXAM: CHEST  1  VIEW  COMPARISON:  02/24/2015  FINDINGS: The heart size and mediastinal contours are within normal limits. Both lungs are clear. Calcified granuloma noted in the right upper lobe. The visualized skeletal structures are unremarkable.  IMPRESSION: No active disease.   Electronically Signed   By: Kerby Moors M.D.   On: 08/22/2015 18:37  [2 weeks]   Assessment: 81 year old male admitted with acute onset of diarrhea and associated low-volume hematochezia per family report, history limited from patient due to dementia. Now with improvement/resolution of diarrhea and rectal bleeding at time of appointment. Rectal bleeding sounds to be minimal, with only mild drift in Hgb since admission likely multifactorial. CT noted with proctocolitis, empiric antibiotics have been started. No Cdiff results on file as diarrhea has ceased. Noted exposure to antibiotics approximately a month ago per family. Ischemic colitis in differential as well. Symptomatically, he has improved with empiric antibiotic therapy. Recommend stool studies if further diarrhea, otherwise continue short course of antibiotics with follow-up as outpatient for elective colonoscopy in 4-6 weeks.  Plan: 1. Colonoscopy in 4-6 weeks as outpatient.  2. Transition to oral antibiotics.   Laureen Ochs. Bernarda Caffey [facility] [phone] 8/24/20169:10 AM     LOS: 2 days

## 2015-09-07 ENCOUNTER — Telehealth: Payer: Self-pay | Admitting: Family Medicine

## 2015-09-07 NOTE — Telephone Encounter (Signed)
[facility]bie from [facility] calling to get verbal orders for this patient  [phone]

## 2015-09-07 NOTE — Telephone Encounter (Signed)
Call placed to [facility].   [place].

## 2015-09-08 NOTE — Telephone Encounter (Signed)
Call placed to [place], [place] with [facility].   Requested eval orders for OT/ST for ROM and cognition.  Verbal order given.   MD to be made aware.

## 2015-09-08 NOTE — Telephone Encounter (Signed)
Call placed to patient. LMTRC.  

## 2015-09-09 DIAGNOSIS — I2510 Atherosclerotic heart disease of native coronary artery without angina pectoris: Secondary | ICD-10-CM | POA: Diagnosis not present

## 2015-09-09 DIAGNOSIS — K6389 Other specified diseases of intestine: Secondary | ICD-10-CM

## 2015-09-09 DIAGNOSIS — E119 Type 2 diabetes mellitus without complications: Secondary | ICD-10-CM

## 2015-09-09 DIAGNOSIS — I10 Essential (primary) hypertension: Secondary | ICD-10-CM

## 2015-09-09 NOTE — Telephone Encounter (Signed)
Agree with above 

## 2015-09-10 IMAGING — CT CT ABD-PELV W/O CM
2 of 4 series · 13 of 46 positions shown, 15 images · non-contrast
Comparison: Lumbar spine radiographs -01/09/2014; abdominal
ultrasound - 09/01/2014

CLINICAL DATA: Right lower quadrant abdominal pain for 2 weeks.
History of hypertension, GERD, diabetes, gallbladder surgery and
prostate cancer.

EXAM:
CT ABDOMEN AND PELVIS WITHOUT CONTRAST
TECHNIQUE: Multidetector CT imaging of the abdomen and pelvis was performed
following the standard protocol without IV contrast.

[Series 2: abdomen/pelvis w/o contrast · axial · non-contrast · 0.67mm/px · z∈[-424,-69]mm · 10 of 87 slices shown, 12 images]
[im 8/87  soft-tissue]
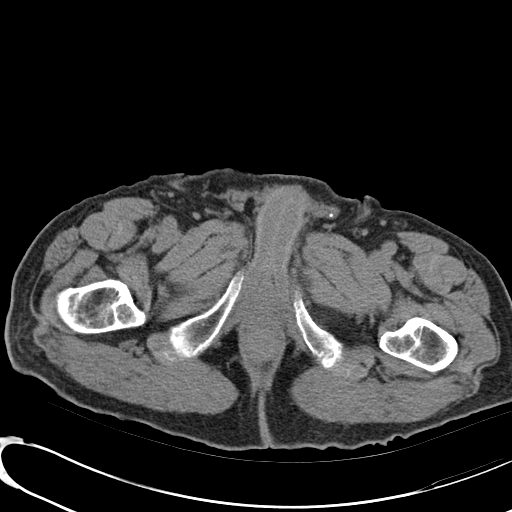
[im 8/87  bone]
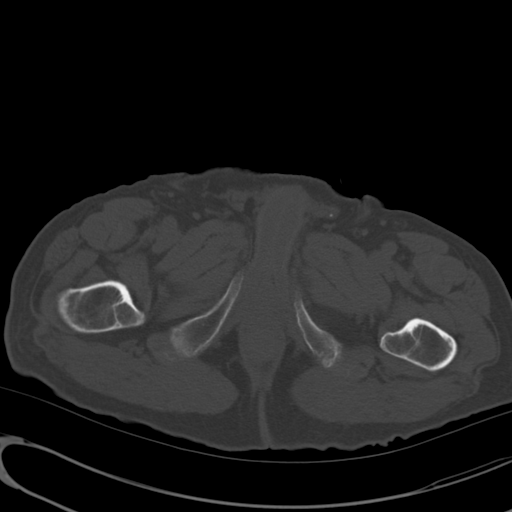
[im 15/87  soft-tissue]
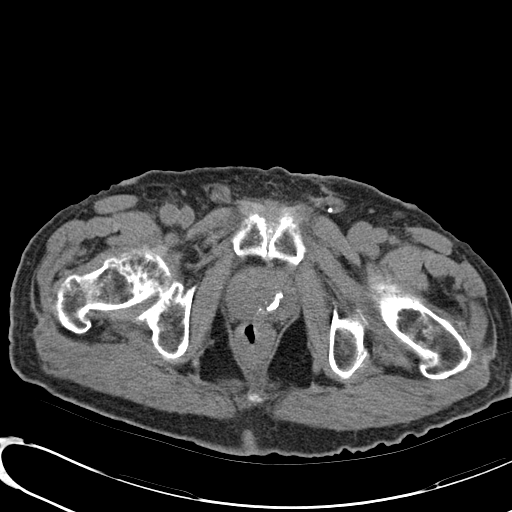
[im 22/87  soft-tissue]
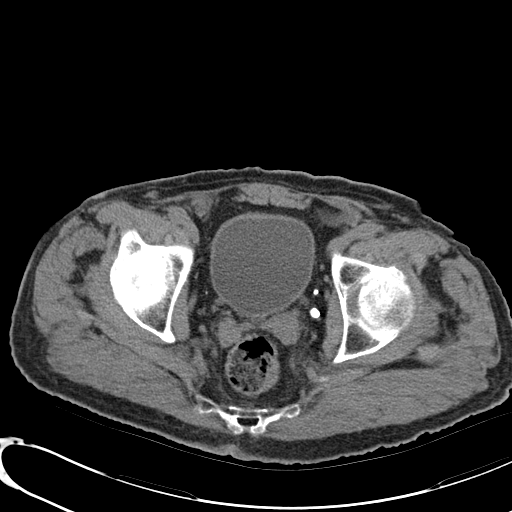
[im 33/87  soft-tissue]
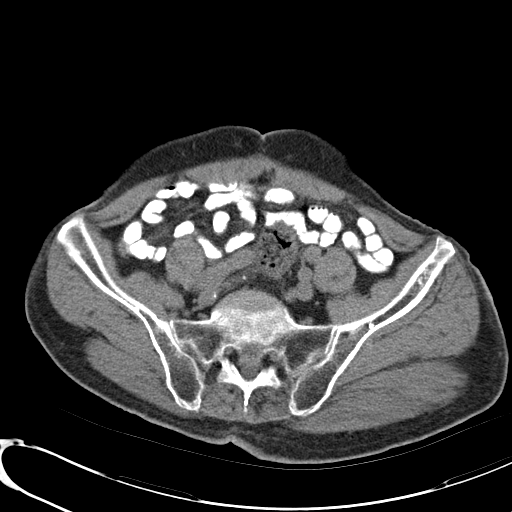
[im 40/87  soft-tissue]
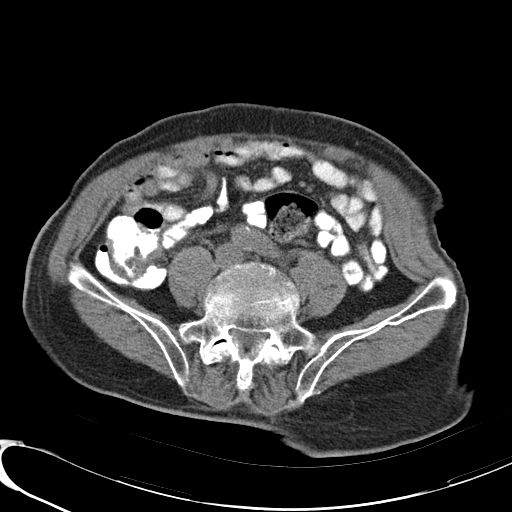
[im 47/87  soft-tissue]
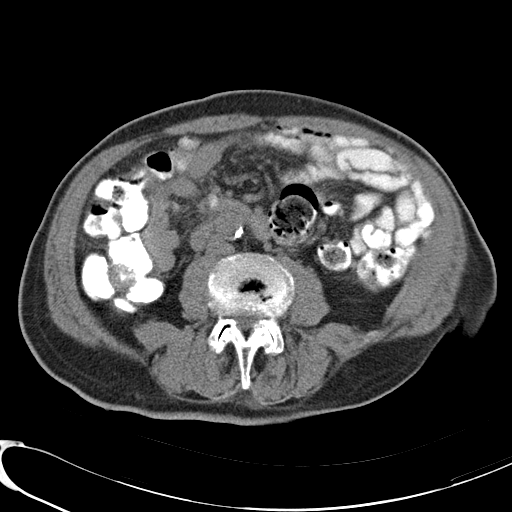
[im 54/87  soft-tissue]
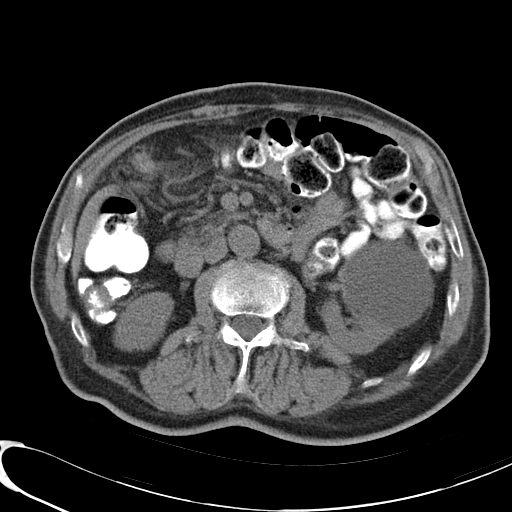
[im 65/87  soft-tissue]
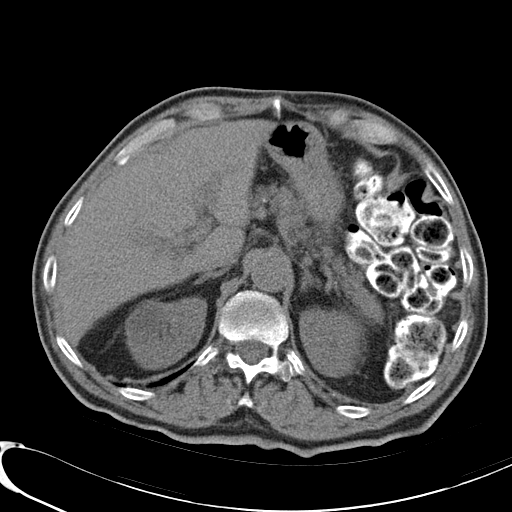
[im 72/87  soft-tissue]
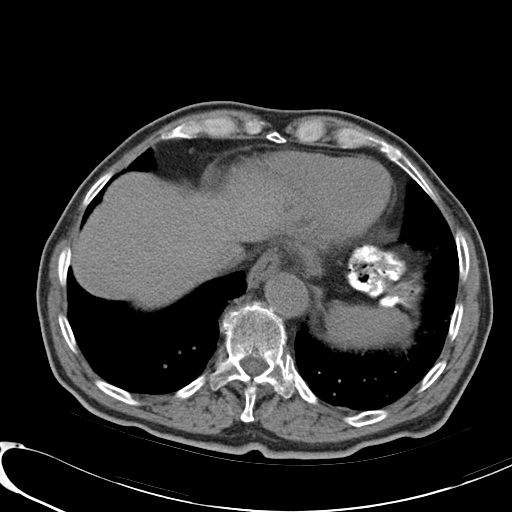
[im 72/87  bone]
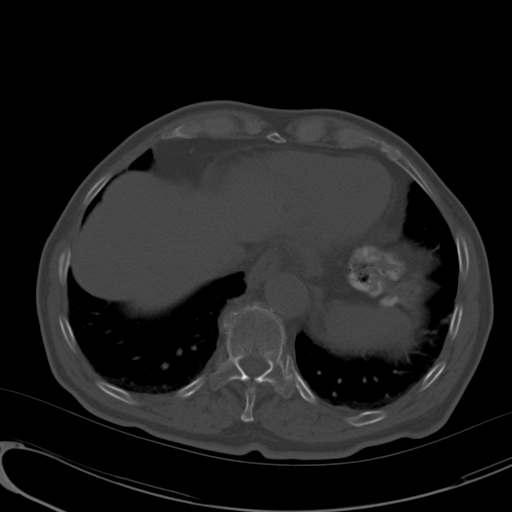
[im 79/87  soft-tissue]
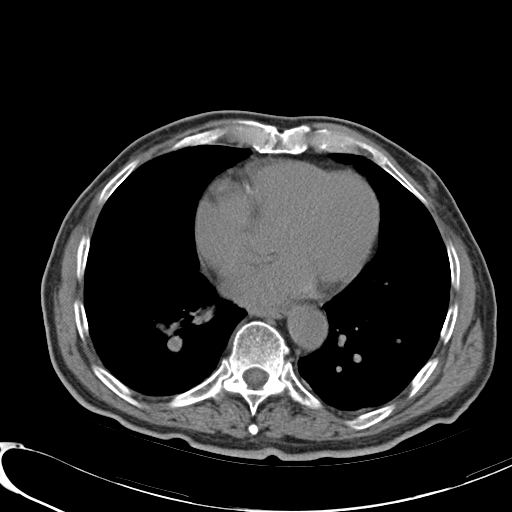

[Series 3: mpr cor 3.0mm · coronal · 0.63mm/px · 3 of 76 slices shown]
[im 26/76  soft-tissue]
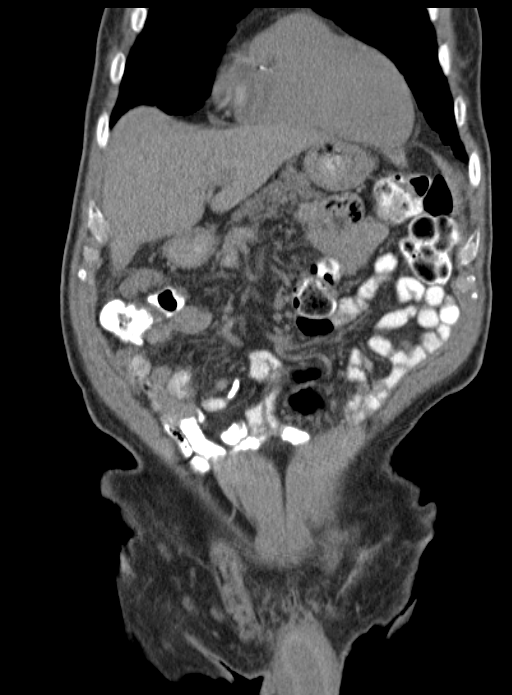
[im 34/76  soft-tissue]
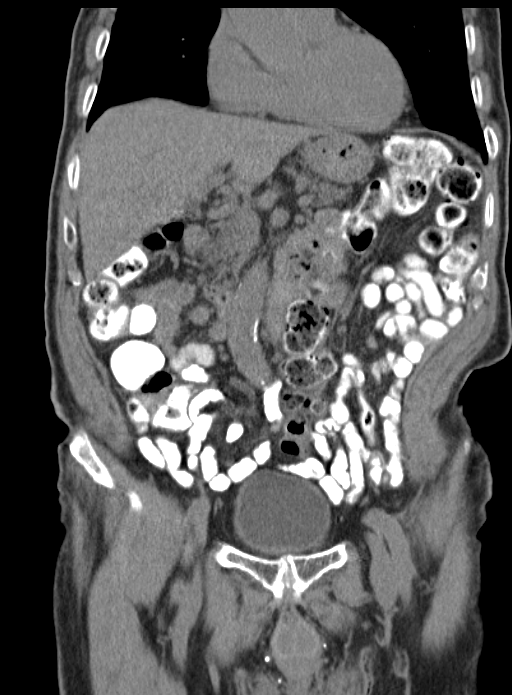
[im 42/76  soft-tissue]
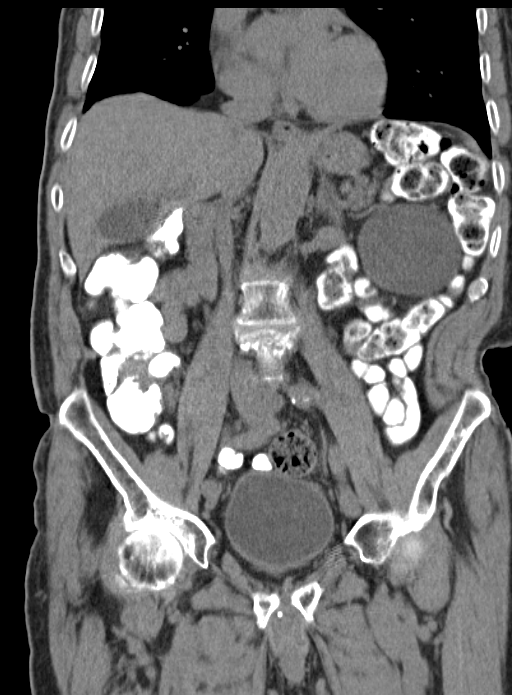

[13 of 46 positions shown; findings below may reference images not displayed]

FINDINGS: The lack of intravenous contrast limits the ability to evaluate
solid abdominal organs.

Normal hepatic contour. There is an approximately 4.1 x 3.0 cm hypo
attenuating lesion within the caudal aspect of the right lobe of the
liver (image 27, series 2) which is his compatible with hepatic cyst
seen on recently performed abdominal ultrasound. Post
cholecystectomy. No ascites.

Note is made of an approximately 3 cm hypo attenuating (0 Hounsfield
unit) exophytic lesion arising from the superior pole of the right
kidney (image 22, series 2) as well as an approximately 6.9 cm hypo
attenuating (7 Hounsfield unit) lesion arising from the anterior
aspect of the left kidney, both of which were previously
characterized as renal cysts. No renal stones. No renal stones are
seen along the expected course of either ureter or the urinary
bladder. There is mild thickening of the urinary bladder wall,
potentially secondary to underdistention. No urinary obstruction or
perinephric stranding.

Dystrophic calcifications within a borderline enlarged prostate
gland. No free fluid within the pelvic cul-de-sac.

New ingested enteric contrast extends to the level of the descending
colon. The appendix is not visualized, however there is no
definitive pericecal inflammatory change. There is a minimal amount
of stranding within the right upper abdominal quadrant adjacent to a
prominent (approximately 1.6 x 1.5 cm diverticulum arising from the
cranial aspect of the hepatic flexure of the colon (coronal image
24, series 3). No definable/drainable fluid collections on this
noncontrast examination. No pneumoperitoneum, pneumatosis or portal
venous gas.

Scattered atherosclerotic plaque within a tortuous but normal
caliber abdominal aorta. No definitive bulky retroperitoneal,
mesenteric, pelvic or inguinal lymphadenopathy on this noncontrast
examination.

Limited visualization of the lower thorax demonstrates a punctate
(approximately 0.6 cm) granuloma within in the imaged right lower
lobe (image 5, series 6). There are scattered 4 mm pulmonary nodules
seen within the image right upper (image 1) and middle (images 2, 5,
6 and 9) lobes.

Bibasilar dependent ground-glass atelectasis. No discrete focal
airspace opacities. No pleural effusion.

Borderline cardiomegaly.  No pericardial effusion.

Interval progression of now moderate (approximately 50%) compression
deformity involving the superior endplate of the L2 vertebral body,
progressed since prior lumbar spine radiographs performed
01/09/2014. There is unchanged mild (approximately 5 mm) and grade 1
anterolisthesis of L4 upon L5 without associated pars defects. Mild
multilevel lumbar spine DDD, worse at L2-L3 and L3-L4 with disc
space height loss, endplate irregularity and sclerosis. Bilateral
facet degenerative change within the lower lumbar spine. Moderate
degenerative change the right hip with joint space loss, subchondral
sclerosis and osteophytosis.

Regional soft tissues appear normal.
IMPRESSION: 1. Suspected acute uncomplicated diverticulitis involving the
hepatic flexure of the colon without definite evidence of
definable/drainable fluid collection on this noncontrast
examination. Otherwise, no explanation for patient's right lower
quadrant abdominal pain. While the appendix is not visualized on
this noncontrast examination, there is no definitive pericecal
inflammatory change.
2. Punctate (approximately 6 mm) granuloma within the imaged right
lower lobe with additional punctate (approximately 4 mm)
noncalcified pulmonary nodules within the image right upper and
middle lobes. If the patient is at high risk for bronchogenic
carcinoma, follow-up chest CT at 1 year is recommended. If the
patient is at low risk, no follow-up is needed. This recommendation
follows the consensus statement: Guidelines for Management of Small
Pulmonary Nodules Detected on CT Scans: A Statement from the
3. Moderate degenerative change of the right hip.
4. Moderate (approximately 50%) compression deformity involving the
superior endplate of the L2 vertebral body, progressed since the
lumbar spine radiographs performed [DATE]. Correlation for point
tenderness at this location is recommended.

## 2015-09-19 ENCOUNTER — Encounter: Payer: Self-pay | Admitting: Gastroenterology

## 2015-09-27 ENCOUNTER — Telehealth: Payer: Self-pay | Admitting: Gastroenterology

## 2015-09-27 NOTE — Telephone Encounter (Signed)
Patient needs E30 hospital f/u with [facility].

## 2015-09-27 NOTE — Telephone Encounter (Signed)
PATIENT SCHEDULED WITH [facility]

## 2015-09-30 ENCOUNTER — Other Ambulatory Visit: Payer: Self-pay

## 2015-09-30 ENCOUNTER — Telehealth: Payer: Self-pay | Admitting: *Deleted

## 2015-09-30 ENCOUNTER — Ambulatory Visit (INDEPENDENT_AMBULATORY_CARE_PROVIDER_SITE_OTHER): Payer: Medicare Other | Admitting: Gastroenterology

## 2015-09-30 ENCOUNTER — Encounter: Payer: Self-pay | Admitting: Gastroenterology

## 2015-09-30 VITALS — BP 161/84 | HR 59 | Temp 97.5°F | Ht 64.0 in | Wt 130.0 lb

## 2015-09-30 DIAGNOSIS — I2510 Atherosclerotic heart disease of native coronary artery without angina pectoris: Secondary | ICD-10-CM

## 2015-09-30 DIAGNOSIS — K51211 Ulcerative (chronic) proctitis with rectal bleeding: Secondary | ICD-10-CM | POA: Diagnosis not present

## 2015-09-30 DIAGNOSIS — K5900 Constipation, unspecified: Secondary | ICD-10-CM

## 2015-09-30 DIAGNOSIS — K625 Hemorrhage of anus and rectum: Secondary | ICD-10-CM

## 2015-09-30 MED ORDER — LUBIPROSTONE 8 MCG PO CAPS: 8.0000 ug | ORAL_CAPSULE | Freq: Two times a day (BID) | ORAL | Status: DC

## 2015-09-30 MED ORDER — NA SULFATE-K SULFATE-MG SULF 17.5-3.13-1.6 GM/177ML PO SOLN: 1.0000 | ORAL | Status: DC

## 2015-09-30 NOTE — Telephone Encounter (Signed)
Received call from [place], [place] with [facility].   States that patient has had diarrhea x3 days. Reports that he was seen by GI for diarrhea and weakness.   Reports that patient has extreme weakness in BLE with illness and requested order for Bedside Commode.   Order printed and faxed to [facility].

## 2015-09-30 NOTE — Progress Notes (Signed)
Discussed with Dr. Oneida Alar. She agrees with colonoscopy as long as patient is able to take the prep. Proceed as planned.

## 2015-09-30 NOTE — Patient Instructions (Signed)
1. Stop Miralax. 2. Start Amitiza 66mcg twice daily with food for constipation. Let me know if this causes multiple loose stools, it should not give laxative effect just make stools more regular.  3. Plan for colonoscopy with Dr. Oneida Alar, if he has difficult taking bowel prep call the office at [phone] before 5pm, after 5pm you call and ask for Ayjah Show on call, [phone].
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
## 2015-09-30 NOTE — Telephone Encounter (Signed)
Received call from patient daughter [name].   Reports that patient was seen at GI today for diarrhea and bloody stools. States that colonoscopy is scheduled for 10/14/2015, but no plan was put in place for stools at this time.   Advised to contact GI office and if no answer given, to have patient seen in ER.

## 2015-09-30 NOTE — Assessment & Plan Note (Signed)
Recent hospitalization for bloody diarrhea. CT showed proctocolitis. Stools never sent off. Treated empirically with Cipro/Flagyl. Having more constipation at this point with intermittent brbpr. Some diarrhea in setting of Miralax/prune juice. Discussed at length with family present today as well as POA, [place] (via phone).   Offered colonoscopy to evaluate ongoing bleeding and to evaluate abnormal CT as per original plan. Manage daily constipation with amitiza 73mcg BID. Hopefully this will eliminate what is likely spurious diarrhea at this point. Suprep given low volume nature due to family's concern of his being able to complete bowel prep.  I have discussed the risks, alternatives, benefits with regards to but not limited to the risk of reaction to medication, bleeding, infection, perforation and the patient is agreeable to proceed. Written consent to be obtained.

## 2015-10-02 ENCOUNTER — Encounter (HOSPITAL_COMMUNITY): Payer: Self-pay | Admitting: Emergency Medicine

## 2015-10-02 ENCOUNTER — Observation Stay (HOSPITAL_COMMUNITY)
Admission: EM | Admit: 2015-10-02 | Discharge: 2015-10-05 | Disposition: A | Discharge status: Home / Self Care | Payer: Medicare Other | Attending: Internal Medicine | Admitting: Internal Medicine

## 2015-10-02 DIAGNOSIS — I10 Essential (primary) hypertension: Secondary | ICD-10-CM | POA: Diagnosis not present

## 2015-10-02 DIAGNOSIS — K6289 Other specified diseases of anus and rectum: Principal | ICD-10-CM | POA: Insufficient documentation

## 2015-10-02 DIAGNOSIS — I2510 Atherosclerotic heart disease of native coronary artery without angina pectoris: Secondary | ICD-10-CM | POA: Diagnosis not present

## 2015-10-02 DIAGNOSIS — E782 Mixed hyperlipidemia: Secondary | ICD-10-CM | POA: Diagnosis not present

## 2015-10-02 DIAGNOSIS — R197 Diarrhea, unspecified: Secondary | ICD-10-CM | POA: Diagnosis not present

## 2015-10-02 DIAGNOSIS — K5900 Constipation, unspecified: Secondary | ICD-10-CM | POA: Diagnosis present

## 2015-10-02 DIAGNOSIS — K648 Other hemorrhoids: Secondary | ICD-10-CM | POA: Diagnosis not present

## 2015-10-02 DIAGNOSIS — E119 Type 2 diabetes mellitus without complications: Secondary | ICD-10-CM | POA: Insufficient documentation

## 2015-10-02 DIAGNOSIS — R627 Adult failure to thrive: Secondary | ICD-10-CM | POA: Insufficient documentation

## 2015-10-02 DIAGNOSIS — Z23 Encounter for immunization: Secondary | ICD-10-CM | POA: Insufficient documentation

## 2015-10-02 DIAGNOSIS — R001 Bradycardia, unspecified: Secondary | ICD-10-CM | POA: Insufficient documentation

## 2015-10-02 DIAGNOSIS — E1129 Type 2 diabetes mellitus with other diabetic kidney complication: Secondary | ICD-10-CM | POA: Diagnosis present

## 2015-10-02 DIAGNOSIS — Z8546 Personal history of malignant neoplasm of prostate: Secondary | ICD-10-CM | POA: Diagnosis not present

## 2015-10-02 DIAGNOSIS — F0390 Unspecified dementia without behavioral disturbance: Secondary | ICD-10-CM | POA: Insufficient documentation

## 2015-10-02 DIAGNOSIS — Z87891 Personal history of nicotine dependence: Secondary | ICD-10-CM | POA: Insufficient documentation

## 2015-10-02 DIAGNOSIS — Z886 Allergy status to analgesic agent status: Secondary | ICD-10-CM | POA: Diagnosis not present

## 2015-10-02 DIAGNOSIS — E785 Hyperlipidemia, unspecified: Secondary | ICD-10-CM

## 2015-10-02 NOTE — ED Notes (Signed)
Pt had period of loose stool in brief. Provided pericare with assistance from pts daughter and Marguerite Olea, EMT. Pt placed into clean brief and blue scrubs.
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
## 2015-10-02 NOTE — ED Notes (Signed)
Family reports hemorrhoids and diarrhea since Thursday.  States he saw GI doctor on Friday but rectum was not checked.  Scheduled for colonoscopy in 2 weeks.

## 2015-10-02 NOTE — ED Notes (Signed)
Dr. Doy Mince to see and assess patient before RN assessment.

## 2015-10-03 ENCOUNTER — Emergency Department (HOSPITAL_COMMUNITY): Payer: Medicare Other

## 2015-10-03 ENCOUNTER — Encounter (HOSPITAL_COMMUNITY): Payer: Self-pay | Admitting: Radiology

## 2015-10-03 DIAGNOSIS — K6289 Other specified diseases of anus and rectum: Secondary | ICD-10-CM | POA: Diagnosis present

## 2015-10-03 DIAGNOSIS — E785 Hyperlipidemia, unspecified: Secondary | ICD-10-CM

## 2015-10-03 DIAGNOSIS — E1121 Type 2 diabetes mellitus with diabetic nephropathy: Secondary | ICD-10-CM | POA: Diagnosis not present

## 2015-10-03 DIAGNOSIS — I2510 Atherosclerotic heart disease of native coronary artery without angina pectoris: Secondary | ICD-10-CM | POA: Diagnosis not present

## 2015-10-03 DIAGNOSIS — R001 Bradycardia, unspecified: Secondary | ICD-10-CM | POA: Diagnosis not present

## 2015-10-03 LAB — CBC WITH DIFFERENTIAL/PLATELET
BASOS ABS: 0 10*3/uL (ref 0.0–0.1)
Basophils Relative: 0 %
Eosinophils Absolute: 0.1 10*3/uL (ref 0.0–0.7)
Eosinophils Relative: 1 %
HEMATOCRIT: 39.8 % (ref 39.0–52.0)
Hemoglobin: 13.8 g/dL (ref 13.0–17.0)
LYMPHS ABS: 1.4 10*3/uL (ref 0.7–4.0)
LYMPHS PCT: 21 %
MCH: 33.3 pg (ref 26.0–34.0)
MCHC: 34.7 g/dL (ref 30.0–36.0)
MCV: 95.9 fL (ref 78.0–100.0)
MONO ABS: 0.6 10*3/uL (ref 0.1–1.0)
MONOS PCT: 9 %
NEUTROS ABS: 4.8 10*3/uL (ref 1.7–7.7)
Neutrophils Relative %: 69 %
Platelets: 166 10*3/uL (ref 150–400)
RBC: 4.15 MIL/uL — ABNORMAL LOW (ref 4.22–5.81)
RDW: 12.8 % (ref 11.5–15.5)
WBC: 7 10*3/uL (ref 4.0–10.5)

## 2015-10-03 LAB — URINE MICROSCOPIC-ADD ON

## 2015-10-03 LAB — URINALYSIS, ROUTINE W REFLEX MICROSCOPIC
GLUCOSE, UA: NEGATIVE mg/dL
HGB URINE DIPSTICK: NEGATIVE
KETONES UR: NEGATIVE mg/dL
LEUKOCYTES UA: NEGATIVE
Nitrite: NEGATIVE
PROTEIN: 30 mg/dL — AB
Specific Gravity, Urine: 1.01 (ref 1.005–1.030)
Urobilinogen, UA: 1 mg/dL (ref 0.0–1.0)
pH: 6.5 (ref 5.0–8.0)

## 2015-10-03 LAB — GLUCOSE, CAPILLARY
GLUCOSE-CAPILLARY: 136 mg/dL — AB (ref 65–99)
Glucose-Capillary: 141 mg/dL — ABNORMAL HIGH (ref 65–99)
Glucose-Capillary: 153 mg/dL — ABNORMAL HIGH (ref 65–99)

## 2015-10-03 LAB — COMPREHENSIVE METABOLIC PANEL
ALT: 19 U/L (ref 17–63)
AST: 24 U/L (ref 15–41)
Albumin: 3.9 g/dL (ref 3.5–5.0)
Alkaline Phosphatase: 64 U/L (ref 38–126)
Anion gap: 9 (ref 5–15)
BILIRUBIN TOTAL: 1.2 mg/dL (ref 0.3–1.2)
BUN: 13 mg/dL (ref 6–20)
CO2: 25 mmol/L (ref 22–32)
Calcium: 9.4 mg/dL (ref 8.9–10.3)
Chloride: 106 mmol/L (ref 101–111)
Creatinine, Ser: 1.2 mg/dL (ref 0.61–1.24)
GFR, EST NON AFRICAN AMERICAN: 53 mL/min — AB (ref >60)
Glucose, Bld: 138 mg/dL — ABNORMAL HIGH (ref 65–99)
POTASSIUM: 4 mmol/L (ref 3.5–5.1)
Sodium: 140 mmol/L (ref 135–145)
TOTAL PROTEIN: 6.8 g/dL (ref 6.5–8.1)

## 2015-10-03 LAB — POC OCCULT BLOOD, ED: Fecal Occult Bld: NEGATIVE

## 2015-10-03 MED ORDER — PANTOPRAZOLE SODIUM 40 MG PO TBEC
40.0000 mg | DELAYED_RELEASE_TABLET | Freq: Every day | ORAL | Status: DC
  Administered 2015-10-03 – 2015-10-05 (×3): 40 mg via ORAL
  Filled 2015-10-03 (×4): qty 1

## 2015-10-03 MED ORDER — ONDANSETRON HCL 4 MG/2ML IJ SOLN: 4.0000 mg | Freq: Four times a day (QID) | INTRAMUSCULAR | Status: DC | PRN

## 2015-10-03 MED ORDER — INFLUENZA VAC SPLIT QUAD 0.5 ML IM SUSY
0.5000 mL | PREFILLED_SYRINGE | INTRAMUSCULAR | Status: AC
  Administered 2015-10-05: 0.5 mL via INTRAMUSCULAR
  Filled 2015-10-03: qty 0.5

## 2015-10-03 MED ORDER — POLYETHYLENE GLYCOL 3350 17 GM/SCOOP PO POWD
0.5000 | Freq: Once | ORAL | Status: AC
  Administered 2015-10-03: 127.5 g via ORAL
  Filled 2015-10-03: qty 255

## 2015-10-03 MED ORDER — POLYETHYLENE GLYCOL 3350 17 GM/SCOOP PO POWD
17.0000 g | Freq: Every day | ORAL | Status: DC | PRN
  Filled 2015-10-03: qty 255

## 2015-10-03 MED ORDER — MORPHINE SULFATE (PF) 4 MG/ML IV SOLN
4.0000 mg | Freq: Once | INTRAVENOUS | Status: AC
  Administered 2015-10-03: 4 mg via INTRAVENOUS
  Filled 2015-10-03: qty 1

## 2015-10-03 MED ORDER — ACETAMINOPHEN 500 MG PO TABS
500.0000 mg | ORAL_TABLET | Freq: Four times a day (QID) | ORAL | Status: DC | PRN
  Administered 2015-10-03 – 2015-10-04 (×3): 500 mg via ORAL
  Filled 2015-10-03 (×3): qty 1

## 2015-10-03 MED ORDER — ONDANSETRON HCL 4 MG/2ML IJ SOLN
4.0000 mg | Freq: Once | INTRAMUSCULAR | Status: AC
  Administered 2015-10-03: 4 mg via INTRAVENOUS
  Filled 2015-10-03: qty 2

## 2015-10-03 MED ORDER — DONEPEZIL HCL 5 MG PO TABS
5.0000 mg | ORAL_TABLET | Freq: Every day | ORAL | Status: DC
  Administered 2015-10-03 – 2015-10-04 (×2): 5 mg via ORAL
  Filled 2015-10-03 (×2): qty 1

## 2015-10-03 MED ORDER — CIPROFLOXACIN IN D5W 400 MG/200ML IV SOLN
400.0000 mg | Freq: Once | INTRAVENOUS | Status: AC
  Administered 2015-10-03: 400 mg via INTRAVENOUS
  Filled 2015-10-03: qty 200

## 2015-10-03 MED ORDER — POLYETHYLENE GLYCOL 3350 17 G PO PACK
17.0000 g | PACK | Freq: Four times a day (QID) | ORAL | Status: DC
  Administered 2015-10-03 – 2015-10-05 (×5): 17 g via ORAL
  Filled 2015-10-03 (×6): qty 1

## 2015-10-03 MED ORDER — ONDANSETRON HCL 4 MG PO TABS: 4.0000 mg | ORAL_TABLET | Freq: Four times a day (QID) | ORAL | Status: DC | PRN

## 2015-10-03 MED ORDER — IOHEXOL 300 MG/ML  SOLN
25.0000 mL | Freq: Once | INTRAMUSCULAR | Status: AC | PRN
  Administered 2015-10-03: 25 mL via ORAL

## 2015-10-03 MED ORDER — POLYETHYLENE GLYCOL 3350 17 G PO PACK: 17.0000 g | PACK | Freq: Every day | ORAL | Status: DC | PRN

## 2015-10-03 MED ORDER — WHITE PETROLATUM GEL
Status: AC
  Administered 2015-10-03: 0.2
  Filled 2015-10-03: qty 1

## 2015-10-03 MED ORDER — LUBIPROSTONE 8 MCG PO CAPS
8.0000 ug | ORAL_CAPSULE | Freq: Two times a day (BID) | ORAL | Status: DC
  Administered 2015-10-03 – 2015-10-05 (×5): 8 ug via ORAL
  Filled 2015-10-03 (×7): qty 1

## 2015-10-03 MED ORDER — SIMVASTATIN 20 MG PO TABS
20.0000 mg | ORAL_TABLET | Freq: Every day | ORAL | Status: DC
  Administered 2015-10-03 – 2015-10-04 (×2): 20 mg via ORAL
  Filled 2015-10-03 (×2): qty 1

## 2015-10-03 MED ORDER — AMLODIPINE BESYLATE 5 MG PO TABS
5.0000 mg | ORAL_TABLET | Freq: Every day | ORAL | Status: DC
  Administered 2015-10-03 – 2015-10-05 (×3): 5 mg via ORAL
  Filled 2015-10-03 (×4): qty 1

## 2015-10-03 MED ORDER — INSULIN ASPART 100 UNIT/ML ~~LOC~~ SOLN
0.0000 [IU] | SUBCUTANEOUS | Status: DC
  Administered 2015-10-03 (×2): 1 [IU] via SUBCUTANEOUS
  Administered 2015-10-04 – 2015-10-05 (×2): 2 [IU] via SUBCUTANEOUS

## 2015-10-03 MED ORDER — HYDROCORTISONE 2.5 % RE CREA
TOPICAL_CREAM | Freq: Two times a day (BID) | RECTAL | Status: DC
  Administered 2015-10-03: 1 via RECTAL
  Administered 2015-10-03 – 2015-10-05 (×3): via RECTAL
  Filled 2015-10-03: qty 28.35

## 2015-10-03 MED ORDER — FENTANYL CITRATE (PF) 100 MCG/2ML IJ SOLN
25.0000 ug | INTRAMUSCULAR | Status: DC | PRN
  Administered 2015-10-03 – 2015-10-04 (×4): 25 ug via INTRAVENOUS
  Filled 2015-10-03 (×4): qty 2

## 2015-10-03 MED ORDER — SODIUM CHLORIDE 0.9 % IV SOLN
INTRAVENOUS | Status: DC
  Administered 2015-10-03 – 2015-10-04 (×2): via INTRAVENOUS

## 2015-10-03 MED ORDER — IOHEXOL 300 MG/ML  SOLN
100.0000 mL | Freq: Once | INTRAMUSCULAR | Status: AC | PRN
  Administered 2015-10-03: 90 mL via INTRAVENOUS

## 2015-10-03 MED ORDER — METRONIDAZOLE IN NACL 5-0.79 MG/ML-% IV SOLN
500.0000 mg | Freq: Once | INTRAVENOUS | Status: AC
  Administered 2015-10-03: 500 mg via INTRAVENOUS
  Filled 2015-10-03: qty 100

## 2015-10-03 NOTE — Progress Notes (Signed)
Pt. Arrived from ED via tech and nurse. Complaining of rectal pain. Family at bedside and will continue to monitor. Joaquin Bend E, RN 10/03/2015 7:07 AM
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
## 2015-10-03 NOTE — Progress Notes (Signed)
cc'ed to pcp °

## 2015-10-03 NOTE — Care Management Note (Signed)
Case Management Note  Patient Details  Name: Kenneth Potter MRN: [id number] Date of Birth: [birth year]  Subjective/Objective:     Date:  10/03/15 Spoke with patient at the bedside along with Jonica, patient's granddaughter  [phone]. Introduced self as Tourist information centre manager and explained role in discharge planning and how to be reached. Verified patient lives in [place], alone with [name], but Judson Roch will be coming to stay with him at dc, has DME cane . Expressed potential need for no other DME. Verified patient anticipates to go home with family at time of discharge and will have full-time supervision by family  at this time to best of their knowledge. Patient denied needing help with their medication. Patient is driven by daughter to MD appointments. Verified patient has PCP Dr. Buelah Manis. Patient will have transport via car at dc.  Patient is active with [facility] for [place], [place], [place], and Education officer, museum.  They would like to resume these services.  Plan: CM will continue to follow for discharge planning and [facility] resources.                Action/Plan:   Expected Discharge Date:                  Expected Discharge Plan:  [place]  In-House Referral:     Discharge planning Services  CM Consult  Post Acute Care Choice:    Choice offered to:     DME Arranged:    DME Agency:     HH Arranged:    [place] Agency:     Status of Service:  In process, will continue to follow  Medicare Important Message Given:    Date Medicare IM Given:    Medicare IM give by:    Date Additional Medicare IM Given:    Additional Medicare Important Message give by:     If discussed at [place] of Stay Meetings, dates discussed:    Additional Comments:  Zenon Mayo, RN 10/03/2015, 4:10 PM
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
## 2015-10-03 NOTE — Progress Notes (Signed)
Attempted report. Nurse to call back. Joaquin Bend E, RN 10/03/2015 6:12 AM

## 2015-10-04 ENCOUNTER — Encounter (HOSPITAL_COMMUNITY): Payer: Self-pay | Admitting: *Deleted

## 2015-10-04 ENCOUNTER — Encounter (HOSPITAL_COMMUNITY)
Admission: EM | Disposition: A | Discharge status: Home / Self Care | Payer: Self-pay | Source: Home / Self Care | Attending: Emergency Medicine

## 2015-10-04 HISTORY — PX: FLEXIBLE SIGMOIDOSCOPY: SHX5431

## 2015-10-04 LAB — BASIC METABOLIC PANEL
ANION GAP: 7 (ref 5–15)
BUN: 8 mg/dL (ref 6–20)
CALCIUM: 8.5 mg/dL — AB (ref 8.9–10.3)
CO2: 23 mmol/L (ref 22–32)
Chloride: 107 mmol/L (ref 101–111)
Creatinine, Ser: 0.98 mg/dL (ref 0.61–1.24)
GFR calc Af Amer: >60 mL/min (ref >60)
Glucose, Bld: 111 mg/dL — ABNORMAL HIGH (ref 65–99)
POTASSIUM: 3.4 mmol/L — AB (ref 3.5–5.1)
SODIUM: 137 mmol/L (ref 135–145)

## 2015-10-04 LAB — GLUCOSE, CAPILLARY
GLUCOSE-CAPILLARY: 128 mg/dL — AB (ref 65–99)
GLUCOSE-CAPILLARY: 105 mg/dL — AB (ref 65–99)
GLUCOSE-CAPILLARY: 118 mg/dL — AB (ref 65–99)
GLUCOSE-CAPILLARY: 151 mg/dL — AB (ref 65–99)
GLUCOSE-CAPILLARY: 118 mg/dL — AB (ref 65–99)
Glucose-Capillary: 114 mg/dL — ABNORMAL HIGH (ref 65–99)

## 2015-10-04 LAB — C DIFFICILE QUICK SCREEN W PCR REFLEX
C DIFFICILE (CDIFF) TOXIN: NEGATIVE
C DIFFICLE (CDIFF) ANTIGEN: NEGATIVE
C Diff interpretation: NEGATIVE

## 2015-10-04 LAB — HEMOGLOBIN A1C
HEMOGLOBIN A1C: 6.6 % — AB (ref 4.8–5.6)
Mean Plasma Glucose: 143 mg/dL

## 2015-10-04 SURGERY — SIGMOIDOSCOPY, FLEXIBLE
Anesthesia: Moderate Sedation

## 2015-10-04 MED ORDER — SODIUM CHLORIDE 0.9 % IV SOLN
INTRAVENOUS | Status: DC
  Administered 2015-10-04: 14:00:00 via INTRAVENOUS

## 2015-10-04 MED ORDER — HYDROCODONE-ACETAMINOPHEN 5-325 MG PO TABS
1.0000 | ORAL_TABLET | ORAL | Status: DC | PRN
  Administered 2015-10-04 – 2015-10-05 (×3): 1 via ORAL
  Filled 2015-10-04 (×3): qty 1

## 2015-10-04 MED ORDER — LORAZEPAM 2 MG/ML IJ SOLN
1.0000 mg | Freq: Once | INTRAMUSCULAR | Status: DC
  Filled 2015-10-04: qty 1

## 2015-10-04 MED ORDER — MIDAZOLAM HCL 5 MG/ML IJ SOLN
INTRAMUSCULAR | Status: AC
  Filled 2015-10-04: qty 1

## 2015-10-04 MED ORDER — POLYETHYLENE GLYCOL 3350 17 G PO PACK: 17.0000 g | PACK | Freq: Two times a day (BID) | ORAL | Status: DC

## 2015-10-04 MED ORDER — HYDROCORTISONE 2.5 % RE CREA
TOPICAL_CREAM | Freq: Two times a day (BID) | RECTAL | Status: DC
Start: 2015-10-04 — End: 2017-11-24

## 2015-10-04 MED ORDER — MAGNESIUM CITRATE PO SOLN
1.0000 | Freq: Once | ORAL | Status: AC
  Administered 2015-10-04: 1 via ORAL
  Filled 2015-10-04: qty 296

## 2015-10-04 MED ORDER — SENNOSIDES-DOCUSATE SODIUM 8.6-50 MG PO TABS: 1.0000 | ORAL_TABLET | Freq: Two times a day (BID) | ORAL | Status: AC

## 2015-10-04 MED ORDER — POTASSIUM CHLORIDE CRYS ER 20 MEQ PO TBCR
40.0000 meq | EXTENDED_RELEASE_TABLET | Freq: Once | ORAL | Status: AC
  Administered 2015-10-04: 40 meq via ORAL
  Filled 2015-10-04: qty 2

## 2015-10-04 MED ORDER — MAGNESIUM CITRATE PO SOLN: 1.0000 | Freq: Once | ORAL | Status: DC | PRN

## 2015-10-04 MED ORDER — PEG 3350-KCL-NA BICARB-NACL 420 G PO SOLR
2000.0000 mL | Freq: Once | ORAL | Status: AC
  Administered 2015-10-04: 2000 mL via ORAL
  Filled 2015-10-04: qty 4000

## 2015-10-04 MED ORDER — FENTANYL CITRATE (PF) 100 MCG/2ML IJ SOLN: 25.0000 ug | INTRAMUSCULAR | Status: DC | PRN

## 2015-10-04 MED ORDER — FENTANYL CITRATE (PF) 100 MCG/2ML IJ SOLN
INTRAMUSCULAR | Status: AC
  Filled 2015-10-04: qty 2

## 2015-10-04 MED ORDER — MIDAZOLAM HCL 10 MG/2ML IJ SOLN
INTRAMUSCULAR | Status: DC | PRN
Start: 2015-10-04 — End: 2015-10-04
  Administered 2015-10-04: 1 mg via INTRAVENOUS

## 2015-10-04 NOTE — Interval H&P Note (Signed)
History and Physical Interval Note:  10/04/2015 10:23 AM  Kenneth Potter  has presented today for surgery, with the diagnosis of abnormal CT  The various methods of treatment have been discussed with the patient and family. After consideration of risks, benefits and other options for treatment, the patient has consented to  Procedure(s): FLEXIBLE SIGMOIDOSCOPY (N/A) as a surgical intervention .  The patient's history has been reviewed, patient examined, no change in status, stable for surgery.  I have reviewed the patient's chart and labs.  Questions were answered to the patient's satisfaction.     Ruston Fedora JR,Emmalyne Giacomo L
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
## 2015-10-04 NOTE — Discharge Instructions (Signed)
Follow with Primary MD [name], MD in 7 days   Get CBC, CMP, checked  by Primary MD next visit.    Activity: As tolerated with Full fall precautions use walker/cane & assistance as needed   Disposition Home   Diet: Heart Healthy  , with feeding assistance and aspiration precautions.  For Heart failure patients - Check your Weight same time everyday, if you gain over 2 pounds, or you develop in leg swelling, experience more shortness of breath or chest pain, call your Primary MD immediately. Follow Cardiac Low Salt Diet and 1.5 lit/day fluid restriction.   On your next visit with your primary care physician please Get Medicines reviewed and adjusted.   Please request your Prim.MD to go over all Hospital Tests and Procedure/Radiological results at the follow up, please get all Hospital records sent to your Prim MD by signing hospital release before you go home.   If you experience worsening of your admission symptoms, develop shortness of breath, life threatening emergency, suicidal or homicidal thoughts you must seek medical attention immediately by calling 911 or calling your MD immediately  if symptoms less severe.  You Must read complete instructions/literature along with all the possible adverse reactions/side effects for all the Medicines you take and that have been prescribed to you. Take any new Medicines after you have completely understood and accpet all the possible adverse reactions/side effects.   Do not drive, operating heavy machinery, perform activities at heights, swimming or participation in water activities or provide baby sitting services if your were admitted for syncope or siezures until you have seen by Primary MD or a Neurologist and advised to do so again.  Do not drive when taking Pain medications.    Do not take more than prescribed Pain, Sleep and Anxiety Medications  Special Instructions: If you have smoked or chewed Tobacco  in the last 2 yrs please  stop smoking, stop any regular Alcohol  and or any Recreational drug use.  Wear Seat belts while driving.   Please note  You were cared for by a hospitalist during your hospital stay. If you have any questions about your discharge medications or the care you received while you were in the hospital after you are discharged, you can call the unit and asked to speak with the hospitalist on call if the hospitalist that took care of you is not available. Once you are discharged, your primary care physician will handle any further medical issues. Please note that NO REFILLS for any discharge medications will be authorized once you are discharged, as it is imperative that you return to your primary care physician (or establish a relationship with a primary care physician if you do not have one) for your aftercare needs so that they can reassess your need for medications and monitor your lab values.

## 2015-10-04 NOTE — Op Note (Signed)
[facility] [place], [address]   FLEXIBLE SIGMOIDOSCOPY PROCEDURE REPORT  PATIENT: Kenneth Potter, Kenneth Potter  MR#: [id number] BIRTHDATE: July 23, 1926 , 57  yrs. old GENDER: male ENDOSCOPIST: Laurence Spates, MD REFERRED BY: [facility] PROCEDURE DATE:  10/04/2015 PROCEDURE:   Flexible Sigmoidoscopy ASA CLASS:   class IV INDICATIONS:anal pain diarrhea and thickened rectosigmoid on CT scan MEDICATIONS: versed 1 mg IV patient had been prepped with tap water enemas to clear  DESCRIPTION OF PROCEDURE:   After the risks benefits and alternatives of the procedure were thoroughly explained, informed consent was obtained.     The     endoscope was introduced through the anus  and advanced to the rectosigmoid area. There was solid stool throughout the colon. The mucosa that could be seen was examined after irrigation in the mucosa appeared normal.      , The exam was Without limitations.    The quality of the prep was poor      . Estimated blood loss is zero unless otherwise noted in this procedure report. The instrument was then slowly withdrawn as the mucosa was fully examined.       The scope was then withdrawn from the patient and the procedure terminated.  COMPLICATIONS: There were no immediate complications.  ENDOSCOPIC IMPRESSION: 1. No sign of rectal sigmoid colitis or mass as suggested by CT 2. Fecal impaction 3. Large internal inextricable hemorrhoids  RECOMMENDATIONS: would treat aggressively for hemorrhoids with Miralax and have the patient follow-up with her gastroenterologist in [place].     please call us for further problems  REPEAT EXAM:  eSigned:  Laurence Spates, MD 10/04/2015 10:47 AM   CC:

## 2015-10-05 ENCOUNTER — Telehealth: Payer: Self-pay | Admitting: Family Medicine

## 2015-10-05 ENCOUNTER — Encounter (HOSPITAL_COMMUNITY): Payer: Self-pay | Admitting: Gastroenterology

## 2015-10-05 DIAGNOSIS — K6289 Other specified diseases of anus and rectum: Secondary | ICD-10-CM | POA: Diagnosis not present

## 2015-10-05 DIAGNOSIS — I10 Essential (primary) hypertension: Secondary | ICD-10-CM

## 2015-10-05 DIAGNOSIS — F0390 Unspecified dementia without behavioral disturbance: Secondary | ICD-10-CM | POA: Diagnosis not present

## 2015-10-05 DIAGNOSIS — R627 Adult failure to thrive: Secondary | ICD-10-CM | POA: Insufficient documentation

## 2015-10-05 DIAGNOSIS — K5900 Constipation, unspecified: Secondary | ICD-10-CM

## 2015-10-05 LAB — GLUCOSE, CAPILLARY
GLUCOSE-CAPILLARY: 151 mg/dL — AB (ref 65–99)
GLUCOSE-CAPILLARY: 84 mg/dL (ref 65–99)
GLUCOSE-CAPILLARY: 94 mg/dL (ref 65–99)
Glucose-Capillary: 98 mg/dL (ref 65–99)

## 2015-10-05 MED ORDER — METRONIDAZOLE 500 MG PO TABS: 500.0000 mg | ORAL_TABLET | Freq: Three times a day (TID) | ORAL | Status: DC

## 2015-10-05 MED ORDER — CIPROFLOXACIN HCL 500 MG PO TABS: 500.0000 mg | ORAL_TABLET | Freq: Two times a day (BID) | ORAL | Status: DC

## 2015-10-05 MED ORDER — HYDROCODONE-ACETAMINOPHEN 5-325 MG PO TABS: 1.0000 | ORAL_TABLET | ORAL | Status: DC | PRN

## 2015-10-05 NOTE — Telephone Encounter (Signed)
Pt's daughter is calling for advice- her father is in the hospital and they are trying to let him go home today. She is wary to let him leave due to his bed sores, please advise.  Lisa ph: [phone]

## 2015-10-05 NOTE — Care Management Note (Signed)
Case Management Note  Patient Details  Name: Kenneth Potter MRN: [id number] Date of Birth: [birth year]  Subjective/Objective:      Patient is for dc today, via car with daughters, daughter states they would like to have [facility] instead of [facility] .  [place] notified by NCM to drop patient.  Referral made to [facility] with [facility] and she was notified patient is for dc today.              Action/Plan:   Expected Discharge Date:                  Expected Discharge Plan:  [place]  In-House Referral:     Discharge planning Services  CM Consult  Post Acute Care Choice:    Choice offered to:     DME Arranged:    DME Agency:     HH Arranged:  RN, PT, Nurse's Aide, Speech Therapy, Social Work [employer] Agency:     Status of Service:  Completed, signed off  Medicare Important Message Given:    Date Medicare IM Given:    Medicare IM give by:    Date Additional Medicare IM Given:    Additional Medicare Important Message give by:     If discussed at [place] of Stay Meetings, dates discussed:    Additional Comments:  Zenon Mayo, RN 10/05/2015, 11:35 AM

## 2015-10-05 NOTE — Progress Notes (Signed)
[facility]  Patient Status: Active (receiving services up to time of hospitalization)  [facility] is providing the following services: PT, OT and MSW  If patient discharges after hours, please call [phone].   Kenneth Potter 10/05/2015, 10:45 AM

## 2015-10-05 NOTE — Progress Notes (Signed)
Patient hd large loose BM x 3 Rn attempted giving soap sud enema but not given as pt continue to have frequent large loose Bm after  X 2 250 ml of Golytely solution  Given. Pt needs lot of encouragement to drink solution. Reason for drinking the solution explained to pt and daughter at bed side. Both verbalized understanding.about 800 ml drank by pt.

## 2015-10-05 NOTE — Telephone Encounter (Signed)
Returned call to patient daughter [name].   States that sigmoidoscopy was performed, but colonoscopy is still required. States that she would prefer to have Dr. Collene Mares as GI specialist. Advised that Dr. Collene Mares would need to see patient as a new patient and then schedule colonoscopy.   Advised to continue with colonoscopy scheduled on 10/14/2015, and then new referral can be placed for Dr. Collene Mares.   Also states that patient has new bed sores to buttocks. Advised that Alvis Lemmings will be re-instated as HHSN. Call placed to [facility] and new orders givent o SN for wound care.   MD aware.

## 2015-10-06 ENCOUNTER — Ambulatory Visit: Payer: Medicare Other | Admitting: Gastroenterology

## 2015-10-06 NOTE — Progress Notes (Signed)
REVIEWED-NO ADDITIONAL RECOMMENDATIONS. 

## 2015-10-07 ENCOUNTER — Telehealth: Payer: Self-pay | Admitting: Family Medicine

## 2015-10-07 NOTE — Telephone Encounter (Signed)
Approval given for therapy at home. Forms for signature to follow

## 2015-10-10 ENCOUNTER — Inpatient Hospital Stay: Payer: Medicare Other | Admitting: Family Medicine

## 2015-10-11 ENCOUNTER — Telehealth: Payer: Self-pay | Admitting: Gastroenterology

## 2015-10-11 ENCOUNTER — Telehealth: Payer: Self-pay | Admitting: Family Medicine

## 2015-10-11 NOTE — Telephone Encounter (Signed)
Call placed to [facility], [place] and VO given on VM.

## 2015-10-11 NOTE — Telephone Encounter (Signed)
I talked with her and she stated that she will call me back in the morning to let me know which prep they got.

## 2015-10-11 NOTE — Telephone Encounter (Signed)
Kieon Lawhorn (daughter) of patient called to ask where the procedure was done and then asked about the prep being a pill. Patient has a hard time drinking and she doesn't think he will tolerate drinking the prep. Please advise and call [phone]

## 2015-10-11 NOTE — Telephone Encounter (Signed)
Kenneth Potter is calling to get approval for occupational therapy 1x week for 4 weeks for health promotion, fall prevention, infection control and ADL transfer.  please call Kenneth with approval [phone]

## 2015-10-12 NOTE — Telephone Encounter (Signed)
Talked with [name] and they do have the right prep. I also when over the instructions with her.

## 2015-10-14 ENCOUNTER — Encounter (HOSPITAL_COMMUNITY): Payer: Self-pay | Admitting: *Deleted

## 2015-10-14 ENCOUNTER — Encounter (HOSPITAL_COMMUNITY)
Admission: RE | Disposition: A | Discharge status: Home / Self Care | Payer: Self-pay | Source: Ambulatory Visit | Attending: Gastroenterology

## 2015-10-14 ENCOUNTER — Ambulatory Visit (HOSPITAL_COMMUNITY)
Admission: RE | Admit: 2015-10-14 | Discharge: 2015-10-14 | Disposition: A | Discharge status: Home / Self Care | Payer: Medicare Other | Source: Ambulatory Visit | Attending: Gastroenterology | Admitting: Gastroenterology

## 2015-10-14 DIAGNOSIS — R933 Abnormal findings on diagnostic imaging of other parts of digestive tract: Secondary | ICD-10-CM | POA: Diagnosis not present

## 2015-10-14 DIAGNOSIS — F419 Anxiety disorder, unspecified: Secondary | ICD-10-CM | POA: Insufficient documentation

## 2015-10-14 DIAGNOSIS — E119 Type 2 diabetes mellitus without complications: Secondary | ICD-10-CM | POA: Insufficient documentation

## 2015-10-14 DIAGNOSIS — Z79899 Other long term (current) drug therapy: Secondary | ICD-10-CM | POA: Diagnosis not present

## 2015-10-14 DIAGNOSIS — K219 Gastro-esophageal reflux disease without esophagitis: Secondary | ICD-10-CM | POA: Insufficient documentation

## 2015-10-14 DIAGNOSIS — Q438 Other specified congenital malformations of intestine: Secondary | ICD-10-CM | POA: Insufficient documentation

## 2015-10-14 DIAGNOSIS — Z8546 Personal history of malignant neoplasm of prostate: Secondary | ICD-10-CM | POA: Insufficient documentation

## 2015-10-14 DIAGNOSIS — D123 Benign neoplasm of transverse colon: Secondary | ICD-10-CM | POA: Diagnosis not present

## 2015-10-14 DIAGNOSIS — K648 Other hemorrhoids: Secondary | ICD-10-CM | POA: Diagnosis not present

## 2015-10-14 DIAGNOSIS — E782 Mixed hyperlipidemia: Secondary | ICD-10-CM | POA: Insufficient documentation

## 2015-10-14 DIAGNOSIS — K921 Melena: Secondary | ICD-10-CM | POA: Diagnosis not present

## 2015-10-14 DIAGNOSIS — I10 Essential (primary) hypertension: Secondary | ICD-10-CM | POA: Insufficient documentation

## 2015-10-14 DIAGNOSIS — K649 Unspecified hemorrhoids: Secondary | ICD-10-CM

## 2015-10-14 HISTORY — PX: COLONOSCOPY: SHX5424

## 2015-10-14 LAB — GLUCOSE, CAPILLARY: Glucose-Capillary: 111 mg/dL — ABNORMAL HIGH (ref 65–99)

## 2015-10-14 SURGERY — COLONOSCOPY
Anesthesia: Moderate Sedation

## 2015-10-14 MED ORDER — MEPERIDINE HCL 100 MG/ML IJ SOLN
INTRAMUSCULAR | Status: AC
  Filled 2015-10-14: qty 2

## 2015-10-14 MED ORDER — MIDAZOLAM HCL 5 MG/5ML IJ SOLN
INTRAMUSCULAR | Status: AC
  Filled 2015-10-14: qty 10

## 2015-10-14 MED ORDER — MIDAZOLAM HCL 5 MG/5ML IJ SOLN
INTRAMUSCULAR | Status: DC | PRN
  Administered 2015-10-14: 1 mg via INTRAVENOUS
  Administered 2015-10-14: 2 mg via INTRAVENOUS

## 2015-10-14 MED ORDER — ATROPINE SULFATE 1 MG/ML IJ SOLN
INTRAMUSCULAR | Status: AC
  Filled 2015-10-14: qty 1

## 2015-10-14 MED ORDER — STERILE WATER FOR IRRIGATION IR SOLN
Status: DC | PRN
  Administered 2015-10-14: 15:00:00

## 2015-10-14 MED ORDER — ATROPINE SULFATE 1 MG/ML IJ SOLN
INTRAMUSCULAR | Status: DC | PRN
  Administered 2015-10-14 (×2): .5 mg via INTRAVENOUS

## 2015-10-14 MED ORDER — MEPERIDINE HCL 100 MG/ML IJ SOLN
INTRAMUSCULAR | Status: DC | PRN
  Administered 2015-10-14: 25 mg via INTRAVENOUS

## 2015-10-14 NOTE — Discharge Instructions (Signed)
You had 2 small polyps removed. You have internal hemorrhoids, which can cause intermittent rectal bleeding.   FOLLOW A HIGH FIBER DIET. SEE INFO BELOW.  USE PREPARATION H or Anusol rectal cream FOUR TIMES  A DAY AS NEEDED TO RELIEVE RECTAL PAIN/PRESSURE/BLEEDING.  YOUR BIOPSY RESULTS WILL BE AVAILABLE IN MY CHART AFTER OCT 18 AND MY OFFICE WILL CONTACT YOU IN 10-14 DAYS WITH YOUR RESULTS.   Colonoscopy Care After Read the instructions outlined below and refer to this sheet in the next week. These discharge instructions provide you with general information on caring for yourself after you leave the hospital. While your treatment has been planned according to the most current medical practices available, unavoidable complications occasionally occur. If you have any problems or questions after discharge, call DR. Colbey Wirtanen, [phone].  ACTIVITY  You may resume your regular activity, but move at a slower pace for the next 24 hours.   Take frequent rest periods for the next 24 hours.   Walking will help get rid of the air and reduce the bloated feeling in your belly (abdomen).   No driving for 24 hours (because of the medicine (anesthesia) used during the test).   You may shower.   Do not sign any important legal documents or operate any machinery for 24 hours (because of the anesthesia used during the test).    NUTRITION  Drink plenty of fluids.   You may resume your normal diet as instructed by your doctor.   Begin with a light meal and progress to your normal diet. Heavy or fried foods are harder to digest and may make you feel sick to your stomach (nauseated).   Avoid alcoholic beverages for 24 hours or as instructed.    MEDICATIONS  You may resume your normal medications.   WHAT YOU CAN EXPECT TODAY  Some feelings of bloating in the abdomen.   Passage of more gas than usual.   Spotting of blood in your stool or on the toilet paper  .  IF YOU HAD POLYPS REMOVED  DURING THE COLONOSCOPY:  Eat a soft diet IF YOU HAVE NAUSEA, BLOATING, ABDOMINAL PAIN, OR VOMITING.    FINDING OUT THE RESULTS OF YOUR TEST Not all test results are available during your visit. DR. Oneida Alar WILL CALL YOU WITHIN 14 DAYS OF YOUR PROCEDUE WITH YOUR RESULTS. Do not assume everything is normal if you have not heard from DR. Malai Lady, CALL HER OFFICE AT [phone].  SEEK IMMEDIATE MEDICAL ATTENTION AND CALL THE OFFICE: [phone] IF:  You have more than a spotting of blood in your stool.   Your belly is swollen (abdominal distention).   You are nauseated or vomiting.   You have a temperature over 101F.   You have abdominal pain or discomfort that is severe or gets worse throughout the day.   High-Fiber Diet A high-fiber diet changes your normal diet to include more whole grains, legumes, fruits, and vegetables. Changes in the diet involve replacing refined carbohydrates with unrefined foods. The calorie level of the diet is essentially unchanged. The Dietary Reference Intake (recommended amount) for adult males is 38 grams per day. For adult females, it is 25 grams per day. Pregnant and lactating women should consume 28 grams of fiber per day. Fiber is the intact part of a plant that is not broken down during digestion. Functional fiber is fiber that has been isolated from the plant to provide a beneficial effect in the body. PURPOSE  Increase stool bulk.  Ease and regulate bowel movements.   Lower cholesterol.  INDICATIONS THAT YOU NEED MORE FIBER  Constipation and hemorrhoids.   Uncomplicated diverticulosis (intestine condition) and irritable bowel syndrome.   Weight management.   As a protective measure against hardening of the arteries (atherosclerosis), diabetes, and cancer.   GUIDELINES FOR INCREASING FIBER IN THE DIET  Start adding fiber to the diet slowly. A gradual increase of about 5 more grams (2 slices of whole-wheat bread, 2 servings of most fruits  or vegetables, or 1 bowl of high-fiber cereal) per day is best. Too rapid an increase in fiber may result in constipation, flatulence, and bloating.   Drink enough water and fluids to keep your urine clear or pale yellow. Water, juice, or caffeine-free drinks are recommended. Not drinking enough fluid may cause constipation.   Eat a variety of high-fiber foods rather than one type of fiber.   Try to increase your intake of fiber through using high-fiber foods rather than fiber pills or supplements that contain small amounts of fiber.   The goal is to change the types of food eaten. Do not supplement your present diet with high-fiber foods, but replace foods in your present diet.  INCLUDE A VARIETY OF FIBER SOURCES  Replace refined and processed grains with whole grains, canned fruits with fresh fruits, and incorporate other fiber sources. White rice, white breads, and most bakery goods contain little or no fiber.   Brown whole-grain rice, buckwheat oats, and many fruits and vegetables are all good sources of fiber. These include: broccoli, Brussels sprouts, cabbage, cauliflower, beets, sweet potatoes, white potatoes (skin on), carrots, tomatoes, eggplant, squash, berries, fresh fruits, and dried fruits.   Cereals appear to be the richest source of fiber. Cereal fiber is found in whole grains and bran. Bran is the fiber-rich outer coat of cereal grain, which is largely removed in refining. In whole-grain cereals, the bran remains. In breakfast cereals, the largest amount of fiber is found in those with "bran" in their names. The fiber content is sometimes indicated on the label.   You may need to include additional fruits and vegetables each day.   In baking, for 1 cup white flour, you may use the following substitutions:   1 cup whole-wheat flour minus 2 tablespoons.   1/2 cup white flour plus 1/2 cup whole-wheat flour.   Polyps, Colon  A polyp is extra tissue that grows inside your body.  Colon polyps grow in the large intestine. The large intestine, also called the colon, is part of your digestive system. It is a long, hollow tube at the end of your digestive tract where your body makes and stores stool. Most polyps are not dangerous. They are benign. This means they are not cancerous. But over time, some types of polyps can turn into cancer. Polyps that are smaller than a pea are usually not harmful. But larger polyps could someday become or may already be cancerous. To be safe, doctors remove all polyps and test them.   WHO GETS POLYPS? Anyone can get polyps, but certain people are more likely than others. You may have a greater chance of getting polyps if:  You are over 50.   You have had polyps before.   Someone in your family has had polyps.   Someone in your family has had cancer of the large intestine.   Find out if someone in your family has had polyps. You may also be more likely to get polyps if you:  Eat a lot of fatty foods   Smoke   Drink alcohol   Do not exercise  Eat too much   TREATMENT  The caregiver will remove the polyp during sigmoidoscopy or colonoscopy.    PREVENTION There is not one sure way to prevent polyps. You might be able to lower your risk of getting them if you:  Eat more fruits and vegetables and less fatty food.   Do not smoke.   Avoid alcohol.   Exercise every day.   Lose weight if you are overweight.   Eating more calcium and folate can also lower your risk of getting polyps. Some foods that are rich in calcium are milk, cheese, and broccoli. Some foods that are rich in folate are chickpeas, kidney beans, and spinach.   Hemorrhoids Hemorrhoids are dilated (enlarged) veins around the rectum. Sometimes clots will form in the veins. This makes them swollen and painful. These are called thrombosed hemorrhoids. Causes of hemorrhoids include:  Constipation.   Straining to have a bowel movement.   HEAVY LIFTING HOME CARE  INSTRUCTIONS  Eat a well balanced diet and drink 6 to 8 glasses of water every day to avoid constipation. You may also use a bulk laxative.   Avoid straining to have bowel movements.   Keep anal area dry and clean.   Do not use a donut shaped pillow or sit on the toilet for long periods. This increases blood pooling and pain.   Move your bowels when your body has the urge; this will require less straining and will decrease pain and pressure.

## 2015-10-14 NOTE — Op Note (Addendum)
[facility] [address] [place], [address]   COLONOSCOPY PROCEDURE REPORT  PATIENT: Kenneth Potter, Kenneth Potter  MR#: [id number] BIRTHDATE: [birth year] , 89  yrs. old GENDER: male ENDOSCOPIST: Danie Binder, MD REFERRED OB:SJGGEZM [place], M.D. PROCEDURE DATE:  2015/11/03 PROCEDURE:   Colonoscopy with biopsy INDICATIONS:hematochezia and abnormal colon and rectum on CT. MEDICATIONS: Demerol 25 mg IV, Versed 3 mg IV, Versed 7 mg IV, Demerol 50 mg IV, and Atropine 1 mg IV  DESCRIPTION OF PROCEDURE:    Physical exam was performed.  Informed consent was obtained from the patient after explaining the benefits, risks, and alternatives to procedure.  The patient was connected to monitor and placed in left lateral position. Continuous oxygen was provided by nasal cannula and IV medicine administered through an indwelling cannula.  After administration of sedation and rectal exam, the patients rectum was intubated and the EC-[id number] ([id number])  colonoscope was advanced under direct visualization to the ileum.  The scope was removed slowly by carefully examining the color, texture, anatomy, and integrity mucosa on the way out.  The patient was recovered in endoscopy and discharged home in satisfactory condition. Estimated blood loss is zero unless otherwise noted in this procedure report.    COLON FINDINGS: The colon was redundant.  Manual abdominal counter-pressure was used to reach the cecum.  TWO 2-3 MM SESSILE HEPATIC FLEXURE POLYPS REMOVED VIA COLD FORCEPS. The patient was moved on to their back to reach the cecum, The examination was otherwise normal.  , The examined terminal ileum appeared to be normal.  , and Moderate sized internal hemorrhoids were found.  PREP QUALITY: excellent.  CECAL W/D TIME: 15       minutes COMPLICATIONS: None  ENDOSCOPIC IMPRESSION: 1.   The LEFT colon IS was redundant 2.   RECTAL BLEEDING DUE TO Moderate sized internal  hemorrhoids  RECOMMENDATIONS: HIGH FIBER DIET PREPARATION H OR ANUSOL CREAM PRN AWAIT BIOPSY      _______________________________ eSignedDanie Binder, MD Nov 03, 2015 4:44 PM   CPT CODES: ICD CODES:  The ICD and CPT codes recommended by this software are interpretations from the data that the clinical staff has captured with the software.  The verification of the translation of this report to the ICD and CPT codes and modifiers is the sole responsibility of the health care institution and practicing physician where this report was generated.  [employer] will not be held responsible for the validity of the ICD and CPT codes included on this report.  AMA assumes no liability for data contained or not contained herein. CPT is a Designer, television/film set of the [employer].
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
## 2015-10-19 ENCOUNTER — Encounter (HOSPITAL_COMMUNITY): Payer: Self-pay | Admitting: Gastroenterology

## 2015-10-24 ENCOUNTER — Telehealth: Payer: Self-pay | Admitting: Gastroenterology

## 2015-10-24 NOTE — Telephone Encounter (Signed)
Please call pt. He had A simple AND A SERRATED adenoma removed.   FOLLOW A HIGH FIBER DIET.   USE PREPARATION H or Anusol rectal cream FOUR TIMES  A DAY AS NEEDED TO RELIEVE RECTAL PAIN/PRESSURE/BLEEDING.  NO ADDITIONAL COLONOSCOPY IS NEEDED IN THE FUTURE DUE TO HIS BEING OVER 77.

## 2015-10-27 NOTE — Telephone Encounter (Signed)
Pt daughter is aware of results

## 2015-10-27 NOTE — Telephone Encounter (Signed)
LMOM to call.

## 2016-01-09 ENCOUNTER — Ambulatory Visit: Payer: Medicare Other | Admitting: Family Medicine

## 2016-05-03 ENCOUNTER — Telehealth: Payer: Self-pay | Admitting: Cardiology

## 2016-05-03 NOTE — Telephone Encounter (Signed)
05/02/16   per Davy Pique with [facility] of [facility]. Dr Doyce Para declined approval for appt w/ Cardiologist schedule May 22, 2016. Stated at this time he does not need to be seeing a cardilogist they will handle all his needs.

## 2016-05-06 ENCOUNTER — Encounter (HOSPITAL_COMMUNITY): Payer: Self-pay | Admitting: *Deleted

## 2016-05-06 ENCOUNTER — Observation Stay (HOSPITAL_COMMUNITY)
Admission: EM | Admit: 2016-05-06 | Discharge: 2016-05-08 | Disposition: A | Discharge status: Home / Self Care | Payer: Medicare (Managed Care) | Attending: Internal Medicine | Admitting: Internal Medicine

## 2016-05-06 DIAGNOSIS — Z79899 Other long term (current) drug therapy: Secondary | ICD-10-CM | POA: Diagnosis not present

## 2016-05-06 DIAGNOSIS — I10 Essential (primary) hypertension: Secondary | ICD-10-CM | POA: Diagnosis not present

## 2016-05-06 DIAGNOSIS — Z8546 Personal history of malignant neoplasm of prostate: Secondary | ICD-10-CM | POA: Diagnosis not present

## 2016-05-06 DIAGNOSIS — I2510 Atherosclerotic heart disease of native coronary artery without angina pectoris: Secondary | ICD-10-CM | POA: Insufficient documentation

## 2016-05-06 DIAGNOSIS — B379 Candidiasis, unspecified: Secondary | ICD-10-CM | POA: Insufficient documentation

## 2016-05-06 DIAGNOSIS — Z87891 Personal history of nicotine dependence: Secondary | ICD-10-CM | POA: Insufficient documentation

## 2016-05-06 DIAGNOSIS — E119 Type 2 diabetes mellitus without complications: Principal | ICD-10-CM | POA: Insufficient documentation

## 2016-05-06 DIAGNOSIS — E782 Mixed hyperlipidemia: Secondary | ICD-10-CM | POA: Diagnosis not present

## 2016-05-06 DIAGNOSIS — B372 Candidiasis of skin and nail: Secondary | ICD-10-CM

## 2016-05-06 DIAGNOSIS — N390 Urinary tract infection, site not specified: Secondary | ICD-10-CM | POA: Diagnosis not present

## 2016-05-06 DIAGNOSIS — N471 Phimosis: Secondary | ICD-10-CM | POA: Diagnosis not present

## 2016-05-06 LAB — CBG MONITORING, ED: GLUCOSE-CAPILLARY: 409 mg/dL — AB (ref 65–99)

## 2016-05-06 LAB — URINE MICROSCOPIC-ADD ON

## 2016-05-06 LAB — URINALYSIS, ROUTINE W REFLEX MICROSCOPIC
BILIRUBIN URINE: NEGATIVE
Glucose, UA: >1000 mg/dL — AB
Ketones, ur: NEGATIVE mg/dL
NITRITE: NEGATIVE
PH: 5.5 (ref 5.0–8.0)
Protein, ur: NEGATIVE mg/dL
SPECIFIC GRAVITY, URINE: 1.01 (ref 1.005–1.030)

## 2016-05-06 MED ORDER — SODIUM CHLORIDE 0.9 % IV BOLUS (SEPSIS)
1000.0000 mL | Freq: Once | INTRAVENOUS | Status: AC
  Administered 2016-05-07: 1000 mL via INTRAVENOUS

## 2016-05-06 MED ORDER — INSULIN REGULAR HUMAN 100 UNIT/ML IJ SOLN
INTRAMUSCULAR | Status: DC
  Administered 2016-05-07: 3.4 [IU]/h via INTRAVENOUS
  Filled 2016-05-06: qty 2.5
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
## 2016-05-06 NOTE — ED Notes (Signed)
Pt is having urinary frequency x 2 weeks; pt has swelling to end of penis

## 2016-05-06 NOTE — ED Notes (Signed)
Bladder scan complete. 80 ML of urine noted in bladder.

## 2016-05-07 ENCOUNTER — Encounter (HOSPITAL_COMMUNITY): Payer: Self-pay

## 2016-05-07 DIAGNOSIS — E119 Type 2 diabetes mellitus without complications: Principal | ICD-10-CM

## 2016-05-07 LAB — CBC
HEMATOCRIT: 34.3 % — AB (ref 39.0–52.0)
HEMOGLOBIN: 12.4 g/dL — AB (ref 13.0–17.0)
MCH: 33.6 pg (ref 26.0–34.0)
MCHC: 36.2 g/dL — AB (ref 30.0–36.0)
MCV: 93 fL (ref 78.0–100.0)
Platelets: 189 10*3/uL (ref 150–400)
RBC: 3.69 MIL/uL — ABNORMAL LOW (ref 4.22–5.81)
RDW: 12.3 % (ref 11.5–15.5)
WBC: 6 10*3/uL (ref 4.0–10.5)

## 2016-05-07 LAB — CBC WITH DIFFERENTIAL/PLATELET
BASOS PCT: 1 %
Basophils Absolute: 0 10*3/uL (ref 0.0–0.1)
Eosinophils Absolute: 0.2 10*3/uL (ref 0.0–0.7)
Eosinophils Relative: 3 %
HEMATOCRIT: 37.7 % — AB (ref 39.0–52.0)
HEMOGLOBIN: 13.5 g/dL (ref 13.0–17.0)
LYMPHS ABS: 2.5 10*3/uL (ref 0.7–4.0)
Lymphocytes Relative: 35 %
MCH: 33.4 pg (ref 26.0–34.0)
MCHC: 35.8 g/dL (ref 30.0–36.0)
MCV: 93.3 fL (ref 78.0–100.0)
MONO ABS: 0.6 10*3/uL (ref 0.1–1.0)
MONOS PCT: 8 %
NEUTROS ABS: 3.8 10*3/uL (ref 1.7–7.7)
Neutrophils Relative %: 53 %
Platelets: 197 10*3/uL (ref 150–400)
RBC: 4.04 MIL/uL — ABNORMAL LOW (ref 4.22–5.81)
RDW: 12.4 % (ref 11.5–15.5)
WBC: 7.1 10*3/uL (ref 4.0–10.5)

## 2016-05-07 LAB — COMPREHENSIVE METABOLIC PANEL
ALBUMIN: 3.5 g/dL (ref 3.5–5.0)
ALBUMIN: 3.9 g/dL (ref 3.5–5.0)
ALK PHOS: 105 U/L (ref 38–126)
ALK PHOS: 90 U/L (ref 38–126)
ALT: 10 U/L — ABNORMAL LOW (ref 17–63)
ALT: 12 U/L — AB (ref 17–63)
ANION GAP: 9 (ref 5–15)
AST: 14 U/L — AB (ref 15–41)
AST: 15 U/L (ref 15–41)
Anion gap: 8 (ref 5–15)
BILIRUBIN TOTAL: 0.6 mg/dL (ref 0.3–1.2)
BUN: 14 mg/dL (ref 6–20)
BUN: 17 mg/dL (ref 6–20)
CALCIUM: 8.7 mg/dL — AB (ref 8.9–10.3)
CALCIUM: 9.4 mg/dL (ref 8.9–10.3)
CHLORIDE: 102 mmol/L (ref 101–111)
CO2: 22 mmol/L (ref 22–32)
CO2: 25 mmol/L (ref 22–32)
Chloride: 106 mmol/L (ref 101–111)
Creatinine, Ser: 0.83 mg/dL (ref 0.61–1.24)
Creatinine, Ser: 1.12 mg/dL (ref 0.61–1.24)
GFR calc Af Amer: >60 mL/min (ref >60)
GFR calc Af Amer: >60 mL/min (ref >60)
GFR calc non Af Amer: 57 mL/min — ABNORMAL LOW (ref >60)
GLUCOSE: 258 mg/dL — AB (ref 65–99)
Glucose, Bld: 442 mg/dL — ABNORMAL HIGH (ref 65–99)
Potassium: 3.2 mmol/L — ABNORMAL LOW (ref 3.5–5.1)
Potassium: 4.3 mmol/L (ref 3.5–5.1)
SODIUM: 136 mmol/L (ref 135–145)
Sodium: 136 mmol/L (ref 135–145)
TOTAL PROTEIN: 6.7 g/dL (ref 6.5–8.1)
Total Bilirubin: 0.7 mg/dL (ref 0.3–1.2)
Total Protein: 7.4 g/dL (ref 6.5–8.1)

## 2016-05-07 LAB — CBG MONITORING, ED
GLUCOSE-CAPILLARY: 320 mg/dL — AB (ref 65–99)
Glucose-Capillary: 401 mg/dL — ABNORMAL HIGH (ref 65–99)

## 2016-05-07 LAB — GLUCOSE, CAPILLARY
GLUCOSE-CAPILLARY: 245 mg/dL — AB (ref 65–99)
GLUCOSE-CAPILLARY: 129 mg/dL — AB (ref 65–99)
GLUCOSE-CAPILLARY: 259 mg/dL — AB (ref 65–99)
Glucose-Capillary: 299 mg/dL — ABNORMAL HIGH (ref 65–99)
Glucose-Capillary: 237 mg/dL — ABNORMAL HIGH (ref 65–99)
Glucose-Capillary: 202 mg/dL — ABNORMAL HIGH (ref 65–99)

## 2016-05-07 LAB — LACTIC ACID, PLASMA
LACTIC ACID, VENOUS: 1 mmol/L (ref 0.5–2.0)
Lactic Acid, Venous: 2 mmol/L (ref 0.5–2.0)

## 2016-05-07 MED ORDER — PANTOPRAZOLE SODIUM 40 MG PO TBEC
40.0000 mg | DELAYED_RELEASE_TABLET | Freq: Every day | ORAL | Status: DC
  Administered 2016-05-07 – 2016-05-08 (×2): 40 mg via ORAL
  Filled 2016-05-07 (×2): qty 1

## 2016-05-07 MED ORDER — ONDANSETRON HCL 4 MG PO TABS: 4.0000 mg | ORAL_TABLET | Freq: Four times a day (QID) | ORAL | Status: DC | PRN

## 2016-05-07 MED ORDER — AMLODIPINE BESYLATE 5 MG PO TABS
5.0000 mg | ORAL_TABLET | Freq: Every day | ORAL | Status: DC
  Administered 2016-05-07 – 2016-05-08 (×2): 5 mg via ORAL
  Filled 2016-05-07 (×2): qty 1

## 2016-05-07 MED ORDER — INSULIN GLARGINE 100 UNIT/ML ~~LOC~~ SOLN
10.0000 [IU] | Freq: Every day | SUBCUTANEOUS | Status: DC
  Administered 2016-05-07: 10 [IU] via SUBCUTANEOUS
  Filled 2016-05-07 (×2): qty 0.1

## 2016-05-07 MED ORDER — POLYETHYLENE GLYCOL 3350 17 GM/SCOOP PO POWD
17.0000 g | Freq: Every day | ORAL | Status: DC | PRN
  Filled 2016-05-07: qty 255

## 2016-05-07 MED ORDER — ONDANSETRON HCL 4 MG/2ML IJ SOLN: 4.0000 mg | Freq: Four times a day (QID) | INTRAMUSCULAR | Status: DC | PRN

## 2016-05-07 MED ORDER — INSULIN ASPART 100 UNIT/ML ~~LOC~~ SOLN
0.0000 [IU] | Freq: Three times a day (TID) | SUBCUTANEOUS | Status: DC
  Administered 2016-05-07: 1 [IU] via SUBCUTANEOUS
  Administered 2016-05-07 (×2): 3 [IU] via SUBCUTANEOUS
  Administered 2016-05-08 (×2): 2 [IU] via SUBCUTANEOUS
  Administered 2016-05-08: 3 [IU] via SUBCUTANEOUS

## 2016-05-07 MED ORDER — SIMVASTATIN 20 MG PO TABS
20.0000 mg | ORAL_TABLET | Freq: Every day | ORAL | Status: DC
  Administered 2016-05-07: 20 mg via ORAL
  Filled 2016-05-07: qty 1

## 2016-05-07 MED ORDER — NYSTATIN 100000 UNIT/GM EX POWD: Freq: Two times a day (BID) | CUTANEOUS | Status: DC

## 2016-05-07 MED ORDER — NYSTATIN 100000 UNIT/GM EX POWD
1.0000 g | Freq: Two times a day (BID) | CUTANEOUS | Status: DC
  Administered 2016-05-07 – 2016-05-08 (×3): 1 g via TOPICAL
  Filled 2016-05-07: qty 15

## 2016-05-07 MED ORDER — INSULIN REGULAR HUMAN 100 UNIT/ML IJ SOLN
INTRAMUSCULAR | Status: AC
  Filled 2016-05-07: qty 2.5

## 2016-05-07 MED ORDER — POLYETHYLENE GLYCOL 3350 17 G PO PACK
17.0000 g | PACK | Freq: Every day | ORAL | Status: DC | PRN
  Administered 2016-05-08: 17 g via ORAL

## 2016-05-07 MED ORDER — SENNOSIDES-DOCUSATE SODIUM 8.6-50 MG PO TABS
1.0000 | ORAL_TABLET | Freq: Two times a day (BID) | ORAL | Status: DC
  Administered 2016-05-07 – 2016-05-08 (×3): 1 via ORAL
  Filled 2016-05-07 (×3): qty 1

## 2016-05-07 MED ORDER — FLUCONAZOLE 100 MG PO TABS
100.0000 mg | ORAL_TABLET | Freq: Every day | ORAL | Status: DC
  Administered 2016-05-08: 100 mg via ORAL
  Filled 2016-05-07: qty 1

## 2016-05-07 MED ORDER — INSULIN GLARGINE 100 UNIT/ML ~~LOC~~ SOLN
10.0000 [IU] | Freq: Every day | SUBCUTANEOUS | Status: DC
  Administered 2016-05-07: 10 [IU] via SUBCUTANEOUS
  Filled 2016-05-07 (×3): qty 0.1

## 2016-05-07 MED ORDER — SODIUM CHLORIDE 0.9 % IV SOLN: INTRAVENOUS | Status: DC

## 2016-05-07 MED ORDER — DONEPEZIL HCL 5 MG PO TABS
5.0000 mg | ORAL_TABLET | Freq: Every day | ORAL | Status: DC
  Administered 2016-05-07: 5 mg via ORAL
  Filled 2016-05-07: qty 1

## 2016-05-07 MED ORDER — DEXTROSE 5 % IV SOLN
1.0000 g | Freq: Once | INTRAVENOUS | Status: AC
  Administered 2016-05-07: 1 g via INTRAVENOUS
  Filled 2016-05-07: qty 10

## 2016-05-07 MED ORDER — ENOXAPARIN SODIUM 40 MG/0.4ML ~~LOC~~ SOLN
40.0000 mg | SUBCUTANEOUS | Status: DC
  Administered 2016-05-07 – 2016-05-08 (×2): 40 mg via SUBCUTANEOUS
  Filled 2016-05-07 (×2): qty 0.4

## 2016-05-07 MED ORDER — FLUCONAZOLE 100 MG PO TABS
100.0000 mg | ORAL_TABLET | Freq: Once | ORAL | Status: AC
  Administered 2016-05-07: 100 mg via ORAL
  Filled 2016-05-07: qty 1
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
## 2016-05-07 NOTE — NC FL2 (Signed)
[place] LEVEL OF CARE SCREENING TOOL     IDENTIFICATION  Patient Name: Kenneth Potter Birthdate: Feb 28, 1926 Sex: male Admission Date (Current Location): 05/06/2016  [facility] [place] and [place] Number:  [employer] and Address:  [place] [address] [place]      Provider Number: [id number]  Attending Physician Name and Address:  Koleen Nimrod Acost*  Relative Name and Phone Number:       Current Level of Care: Hospital Recommended Level of Care: [place] Prior Approval Number:    Date Approved/Denied:   PASRR Number:   [id number]   Discharge Plan: SNF    Current Diagnoses: Patient Active Problem List   Diagnosis Date Noted  . New onset type 2 diabetes mellitus ([place]) 05/07/2016  . Diabetes mellitus ([place]) 05/07/2016  . FTT (failure to thrive) in adult   . Proctitis 10/03/2015  . Hyperlipidemia 10/03/2015  . Colitis   . AKI (acute kidney injury) ([place]) 08/23/2015  . Atypical chest pain 08/22/2015  . Urinary and fecal incontinence 07/05/2015  . Insomnia 02/15/2015  . Lower abdominal pain 02/15/2015  . Hypokalemia 02/08/2015  . Pneumonia 02/08/2015  . Hypotension 10/10/2014  . Constipation 10/08/2014  . Protein-calorie malnutrition, severe ([place]) 09/01/2014  . Chronic diastolic heart failure ([place]) 09/01/2014  . Diarrhea 08/31/2014  . GERD (gastroesophageal reflux disease) 05/05/2013  . OA (osteoarthritis) of knee 05/05/2013  . Dementia 02/04/2013  . Gait instability 12/02/2012  . DM (diabetes mellitus) type II controlled with renal manifestation ([place]) 11/20/2012  . History of prostate cancer 11/20/2012  . CAD (coronary artery disease) 07/05/2012  . Mixed hyperlipidemia   . Essential hypertension   . Unstable angina pectoris ([place]) 07/02/2012  . Bradycardia 02/26/2012  . Dizziness 02/26/2012    Orientation RESPIRATION BLADDER Height & Weight     Self  Normal Continent Weight: 127 lb 3.3 oz  (57.7 kg) Height:  5\' 4"  (162.6 cm)  BEHAVIORAL SYMPTOMS/MOOD NEUROLOGICAL BOWEL NUTRITION STATUS      Continent Diet (Diabetic)  AMBULATORY STATUS COMMUNICATION OF NEEDS Skin   Limited Assist Verbally Normal                       Personal Care Assistance Level of Assistance  Bathing, Feeding, Dressing, Total care Bathing Assistance: Limited assistance Feeding assistance: Limited assistance Dressing Assistance: Limited assistance Total Care Assistance: Limited assistance   Functional Limitations Info  Sight, Hearing, Speech Sight Info: Adequate Hearing Info: Adequate Speech Info: Adequate    SPECIAL CARE FACTORS FREQUENCY  PT (By licensed PT)     PT Frequency: x5              Contractures      Additional Factors Info  Code Status, Allergies Code Status Info: DNR Allergies Info: [place],Asparin           Current Medications (05/07/2016):  This is the current hospital active medication list Current Facility-Administered Medications  Medication Dose Route Frequency Provider Last Rate Last Dose  . 0.9 %  sodium chloride infusion   Intravenous Continuous Oswald Hillock, MD      . amLODipine (NORVASC) tablet 5 mg  5 mg Oral Daily Oswald Hillock, MD   5 mg at 05/07/16 1034  . donepezil (ARICEPT) tablet 5 mg  5 mg Oral QHS Oswald Hillock, MD      . enoxaparin (LOVENOX) injection 40 mg  40 mg Subcutaneous Q24H Gagan S Lama,  MD   40 mg at 05/07/16 1034  . [START ON 05/08/2016] fluconazole (DIFLUCAN) tablet 100 mg  100 mg Oral Daily Oswald Hillock, MD      . insulin aspart (novoLOG) injection 0-9 Units  0-9 Units Subcutaneous TID WC Oswald Hillock, MD   3 Units at 05/07/16 1223  . insulin glargine (LANTUS) injection 10 Units  10 Units Subcutaneous QHS Oswald Hillock, MD   10 Units at 05/07/16 0231  . insulin glargine (LANTUS) injection 10 Units  10 Units Subcutaneous QHS Oswald Hillock, MD      . nystatin (MYCOSTATIN) powder 1 g  1 g Topical BID Erline Hau, MD      .  ondansetron [facility]) tablet 4 mg  4 mg Oral Q6H PRN Oswald Hillock, MD       Or  . ondansetron (ZOFRAN) injection 4 mg  4 mg Intravenous Q6H PRN Oswald Hillock, MD      . pantoprazole (PROTONIX) EC tablet 40 mg  40 mg Oral Daily Oswald Hillock, MD   40 mg at 05/07/16 1033  . polyethylene glycol (MIRALAX / GLYCOLAX) packet 17 g  17 g Oral Daily PRN Erline Hau, MD      . senna-docusate (Senokot-S) tablet 1 tablet  1 tablet Oral BID Oswald Hillock, MD   1 tablet at 05/07/16 1033  . simvastatin (ZOCOR) tablet 20 mg  20 mg Oral QHS Oswald Hillock, MD         Discharge Medications: Please see discharge summary for a list of discharge medications.  Relevant Imaging Results:  Relevant Lab Results:   Additional Information  SSN [id number]  Joana Reamer, [place]

## 2016-05-07 NOTE — Clinical Social Work Placement (Signed)
   CLINICAL SOCIAL WORK PLACEMENT  NOTE  Date:  05/07/2016  Patient Details  Name: Kenneth Potter MRN: [id number] Date of Birth: 1926-07-16  Clinical Social Work is seeking post-discharge placement for this patient at the [place] level of care (*CSW will initial, date and re-position this form in  chart as items are completed):  Yes   Patient/family provided with [place] Work Department's list of facilities offering this level of care within the geographic area requested by the patient (or if unable, by the patient's family).  Yes   Patient/family informed of their freedom to choose among providers that offer the needed level of care, that participate in Medicare, Medicaid or managed care program needed by the patient, have an available bed and are willing to accept the patient.  Yes   Patient/family informed of [place]'s ownership interest in [facility] and [facility], as well as of the fact that they are under no obligation to receive care at these facilities.  PASRR submitted to EDS on       PASRR number received on       Existing PASRR number confirmed on 05/07/16     FL2 transmitted to all facilities in geographic area requested by pt/family on 05/07/16     FL2 transmitted to all facilities within larger geographic area on       Patient informed that his/her managed care company has contracts with or will negotiate with certain facilities, including the following:            Patient/family informed of bed offers received.  Patient chooses bed at       Physician recommends and patient chooses bed at      Patient to be transferred to   on  .  Patient to be transferred to facility by       Patient family notified on   of transfer.  Name of family member notified:        PHYSICIAN       Additional Comment:    _______________________________________________ Salome Arnt, [place] 05/07/2016, 2:52  PM [phone]

## 2016-05-07 NOTE — Care Management Note (Signed)
Case Management Note  Patient Details  Name: JAYTHON STOFFLET MRN: [id number] Date of Birth: 06-Sep-1926  Subjective/Objective: Spoke with Buel Ream RN from [place].  [place], [place], [place] [address] Phone: [phone] CSW is Raquel Sarna.  Patient [facility] Patient lives with daughter [name].  Jackelyn Poling stated that patient was diabetic prior but had been doing well with blood sugars and taken off medications.  He will need to restart inulin and glucometer check when discharged.  Jackelyn Poling stated that family had relayed to her that they were not comfortable doing this. Awaiting PT eval at this time. More to follow.                Action/Plan: Home followed by [facility].   Expected Discharge Date:                  Expected Discharge Plan:  [place]  In-House Referral:     Discharge planning Services  CM Consult  Post Acute Care Choice:  Resumption of Svcs/PTA Provider Choice offered to:     DME Arranged:    DME Agency:     HH Arranged:    [place] Agency:   ([facility])  Status of Service:  In process, will continue to follow  Medicare Important Message Given:    Date Medicare IM Given:    Medicare IM give by:    Date Additional Medicare IM Given:    Additional Medicare Important Message give by:     If discussed at [place] of Stay Meetings, dates discussed:    Additional Comments:  Alvie Heidelberg, RN 05/07/2016, 11:31 AM

## 2016-05-07 NOTE — Evaluation (Signed)
Physical Therapy Evaluation Patient Details Name: Kenneth Potter MRN: [id number] DOB: [birth year] Today's Date: 05/07/2016   History of Present Illness  82 yo M admitted with increased swelling of the penis and increased frequency of urination.  Dx: Balanitis due to uncontrolled DM.  PMH: HTN, near syncope, CAD, bradycardia, DM, prostate CA, anxiety, seizures, EF: 60-65%, B shoulder surgery, stomach surgery.   Clinical Impression  Pt received in bed, no family available to assist with PLOF information.  Pt states that he lives with his dtr, and ambulates with a cane.  He requires assistance for dressing and bathing.  Today during PT evaluation, he required Min A for sit<>stand transfer with RW, and ambulated 80ft with RW and Min guard.  Pt will need 24/7 supervision/assistance at home, and may benefit from ALF with HHPT at d/c.     Follow Up Recommendations Other (comment) (ALF with HHPT)    Equipment Recommendations       Recommendations for Other Services       Precautions / Restrictions Restrictions Weight Bearing Restrictions: No      Mobility  Bed Mobility Overal bed mobility: Needs Assistance Bed Mobility: Supine to Sit     Supine to sit: Min guard     General bed mobility comments: Pt required increased time, and encouragement to stay on task.   Transfers     Transfers: Sit to/from Stand Sit to Stand: Min assist         General transfer comment: Pt having difficulty with anterior weight shift required for transfer into upright position.    Ambulation/Gait Ambulation/Gait assistance: Min guard Ambulation Distance (Feet): 40 Feet Assistive device: Rolling walker (2 wheeled) Gait Pattern/deviations: Shuffle;Trunk flexed     General Gait Details: pt with slowed shuffled gait, but steady.  Pt requested to terminate further gait due to fatigue, however, no outward  signs of fatigue noted.    Stairs            Wheelchair Mobility    Modified Rankin  (Stroke Patients Only)       Balance Overall balance assessment: Needs assistance         Standing balance support: Bilateral upper extremity supported Standing balance-Leahy Scale: Fair                               Pertinent Vitals/Pain Pain Assessment: No/denies pain    Home Living   Living Arrangements: Children (Pt states that he lives with his dtr, Patsy. )   Type of Home: House Home Access: Stairs to enter   [employer] of Steps: 4 Home Layout: One level Home Equipment: [place] - 2 wheels;Cane - quad;Shower seat      Prior Function Level of [place]: Needs assistance   Gait / Transfers Assistance Needed: Pt states that he ambulates with a cane  ADL's / Homemaking Assistance Needed: Pt requires assistance from family for dressing, bathing, and cooking.         Hand Dominance        Extremity/Trunk Assessment   Upper Extremity Assessment: Overall WFL for tasks assessed           Lower Extremity Assessment: Generalized weakness         Communication   Communication: Other (comment) (Pt slightly lethargic, and needs multiple cues to stay awake.  Pt answers most questions with yes/no, and needs further prodding for details. )  Cognition Arousal/Alertness: Lethargic Behavior During Therapy:  WFL for tasks assessed/performed Overall Cognitive Status: History of cognitive impairments - at baseline                      General Comments      Exercises        Assessment/Plan    PT Assessment Patient needs continued PT services  PT Diagnosis Difficulty walking   PT Problem List Decreased strength;Decreased activity tolerance;Decreased balance;Decreased mobility;Decreased cognition;Decreased knowledge of use of DME;Decreased safety awareness;Decreased knowledge of precautions  PT Treatment Interventions Gait training;Functional mobility training;Therapeutic activities;Therapeutic exercise;Balance training;DME  instruction;Patient/family education;Cognitive remediation   PT Goals (Current goals can be found in the Care Plan section) Acute Rehab PT Goals PT Goal Formulation: Patient unable to participate in goal setting Time For Goal Achievement: 05/21/16 Potential to Achieve Goals: Fair    Frequency Min 3X/week   Barriers to discharge        Co-evaluation               End of Session Equipment Utilized During Treatment: Gait belt Activity Tolerance: Patient tolerated treatment well Patient left: in chair;with call bell/phone within reach      Functional Assessment Tool Used: [employer] "6-clicks."  Functional Limitation: Mobility: Walking and moving around Mobility: Walking and Moving Around Current Status [phone]): At least 40 percent but less than 60 percent impaired, limited or restricted Mobility: Walking and Moving Around Goal Status [phone]): At least 20 percent but less than 40 percent impaired, limited or restricted    Time: 1319-1339 PT Time Calculation (min) (ACUTE ONLY): 20 min   Charges:   PT Evaluation $PT Eval Low Complexity: 1 Procedure     PT G Codes:   PT G-Codes **NOT FOR INPATIENT CLASS** Functional Assessment Tool Used: [employer] "6-clicks."  Functional Limitation: Mobility: Walking and moving around Mobility: Walking and Moving Around Current Status [phone]): At least 40 percent but less than 60 percent impaired, limited or restricted Mobility: Walking and Moving Around Goal Status [phone]): At least 20 percent but less than 40 percent impaired, limited or restricted   Tacy Learn, PT, DPT X: [id number]   05/07/2016, 1:57 PM

## 2016-05-07 NOTE — Progress Notes (Signed)
Patient seen and examined, discussed with daughter at bedside. Patient here with diabetes and elevated CBGs. Discussed via phone with patient's primary care physician with the [facility], Dr. Jimmye Norman. She expresses concerns that the family has about managing her diabetes with insulin at home. Also the family has been struggling with caring for patient given his severe perineal yeast infection and incontinence. We are looking into potential ALF versus SNF placement.  Domingo Mend, MD [facility] Pager: [phone]

## 2016-05-07 NOTE — Clinical Social Work Note (Signed)
Clinical Social Work Assessment  Patient Details  Name: Kenneth Potter MRN: [id number] Date of Birth: 1926/04/22  Date of referral:  05/07/16               Reason for consult:  Discharge Planning                Permission sought to share information with:    Permission granted to share information::  Yes, Verbal Permission Granted  Name::        Agency::  [facility]  Relationship::     Contact Information:     Housing/Transportation Living arrangements for the past 2 months:  Single Family Home Source of Information:  Adult Children Patient Interpreter Needed:  None Criminal Activity/Legal Involvement Pertinent to Current Situation/Hospitalization:  No - Comment as needed Significant Relationships:  Adult Children, Other(Comment) ([facility]) Lives with:  Adult Children Do you feel safe going back to the place where you live?  Yes Need for family participation in patient care:  Yes (Comment)  Care giving concerns:  Family concerned about new diagnosis of diabetes.    Social Worker assessment / plan:  CSW spoke with pt's daughter, [name] as pt oriented to self only. [name] reports that she lives with pt and someone is with him around the clock. He is involved with [facility] and goes to the day center there Monday, Tuesday, and Thursday from 8:30-3:30. Family indicate that they assist with all ADLs. Per Raquel Sarna, Education officer, museum with [facility], they are willing for pt to go to SNF. She is aware that no skilled need was identified in hospital. Raquel Sarna states they have contracts with [place], [place], [employer], and [place]. Family aware and request [facility] first, but will consider other facilities if needed. They understand that this is short term and [facility] will address plan after SNF.   Employment status:  Retired Forensic scientist:  Other (Comment Required) ([facility]) PT Recommendations:  Home with [place] / Referral to community resources:  [place]  Patient/Family's Response to care:  Pt's family agreeable to short term SNF.   Patient/Family's Understanding of and Emotional Response to Diagnosis, Current Treatment, and Prognosis:  Pt's family is feeling a little overwhelmed regarding insulin and checking blood sugar. They are very open to SNF as [facility] is willing to pursue this.   Emotional Assessment Appearance:  Appears stated age Attitude/Demeanor/Rapport:  Unable to Assess Affect (typically observed):  Unable to Assess Orientation:  Oriented to Self Alcohol / Substance use:  Not Applicable Psych involvement (Current and /or in the community):  No (Comment)  Discharge Needs  Concerns to be addressed:  Discharge Planning Concerns Readmission within the last 30 days:  No Current discharge risk:  None Barriers to Discharge:  Continued Medical Work up   [employer], [employer], [place] 05/07/2016, 3:02 PM [phone]

## 2016-05-07 NOTE — Clinical Social Work Placement (Signed)
   CLINICAL SOCIAL WORK PLACEMENT  NOTE  Date:  05/07/2016  Patient Details  Name: Kenneth Potter MRN: [id number] Date of Birth: 11-04-1926  Clinical Social Work is seeking post-discharge placement for this patient at the [place] level of care (*CSW will initial, date and re-position this form in  chart as items are completed):  Yes   Patient/family provided with [place] Work Department's list of facilities offering this level of care within the geographic area requested by the patient (or if unable, by the patient's family).  Yes   Patient/family informed of their freedom to choose among providers that offer the needed level of care, that participate in Medicare, Medicaid or managed care program needed by the patient, have an available bed and are willing to accept the patient.  Yes   Patient/family informed of [place]'s ownership interest in [facility] and [facility], as well as of the fact that they are under no obligation to receive care at these facilities.  PASRR submitted to EDS on       PASRR number received on       Existing PASRR number confirmed on 05/07/16     FL2 transmitted to all facilities in geographic area requested by pt/family on 05/07/16     FL2 transmitted to all facilities within larger geographic area on       Patient informed that his/her managed care company has contracts with or will negotiate with certain facilities, including the following:        Yes   Patient/family informed of bed offers received.  Patient [facility] bed at [facility]     Physician recommends and patient chooses bed at      Patient to be transferred to [facility] on  .  Patient to be transferred to facility by       Patient family notified on   of transfer.  Name of family member notified:        PHYSICIAN       Additional Comment:    _______________________________________________ Salome Arnt, LCSW 05/07/2016, 4:00 PM [phone]

## 2016-05-07 NOTE — Progress Notes (Signed)
Inpatient Diabetes Program Recommendations  AACE/ADA: New Consensus Statement on Inpatient Glycemic Control (2015)  Target Ranges:  Prepandial:   less than 140 mg/dL      Peak postprandial:   less than 180 mg/dL (1-2 hours)      Critically ill patients:  140 - 180 mg/dL   Results for BRANDUN, ROH (MRN [id number]) as of 05/07/2016 12:24  Ref. Range 05/07/2016 05:23 05/07/2016 08:07  Glucose-Capillary Latest Ref Range: 65-99 mg/dL 245 (H) 237 (H)   Review of Glycemic Control  Diabetes history: DM 2 Outpatient Diabetes medications: None Current orders for Inpatient glycemic control: Lantus 10 units, Novolog Sensitive TID  Inpatient Diabetes Program Recommendations: Insulin - Basal: Glucose still in the 200's after 10 units of basal insulin. Please consider increasing basal insulin to Lantus 16 units.  A1c in process  Thanks, Tama Headings RN, MSN, [facility] Inpatient Diabetes Coordinator Team Pager [phone] (8a-5p)

## 2016-05-08 ENCOUNTER — Inpatient Hospital Stay
Admission: RE | Admit: 2016-05-08 | Discharge: 2016-05-24 | Disposition: A | Discharge status: Home / Self Care | Payer: Medicare (Managed Care) | Source: Ambulatory Visit | Attending: Family Medicine | Admitting: Family Medicine

## 2016-05-08 DIAGNOSIS — E119 Type 2 diabetes mellitus without complications: Secondary | ICD-10-CM | POA: Diagnosis not present

## 2016-05-08 LAB — GLUCOSE, CAPILLARY
GLUCOSE-CAPILLARY: 190 mg/dL — AB (ref 65–99)
Glucose-Capillary: 245 mg/dL — ABNORMAL HIGH (ref 65–99)
Glucose-Capillary: 195 mg/dL — ABNORMAL HIGH (ref 65–99)

## 2016-05-08 LAB — URINE CULTURE: Special Requests: NORMAL

## 2016-05-08 LAB — HEMOGLOBIN A1C
Hgb A1c MFr Bld: 11.6 % — ABNORMAL HIGH (ref 4.8–5.6)
MEAN PLASMA GLUCOSE: 286 mg/dL

## 2016-05-08 MED ORDER — NYSTATIN 100000 UNIT/GM EX POWD: 1.0000 g | Freq: Two times a day (BID) | CUTANEOUS | Status: DC

## 2016-05-08 MED ORDER — INSULIN GLARGINE 100 UNIT/ML ~~LOC~~ SOLN: 10.0000 [IU] | Freq: Every day | SUBCUTANEOUS | Status: DC

## 2016-05-08 MED ORDER — HYDROCODONE-ACETAMINOPHEN 5-325 MG PO TABS: 1.0000 | ORAL_TABLET | ORAL | Status: DC | PRN

## 2016-05-08 NOTE — Progress Notes (Signed)
Called report to [facility] nurse.  

## 2016-05-08 NOTE — Clinical Social Work Placement (Signed)
   CLINICAL SOCIAL WORK PLACEMENT  NOTE  Date:  05/08/2016  Patient Details  Name: Kenneth Potter MRN: [id number] Date of Birth: [birth year]  Clinical Social Work is seeking post-discharge placement for this patient at the [place] level of care (*CSW will initial, date and re-position this form in  chart as items are completed):  Yes   Patient/family provided with [place] Work Department's list of facilities offering this level of care within the geographic area requested by the patient (or if unable, by the patient's family).  Yes   Patient/family informed of their freedom to choose among providers that offer the needed level of care, that participate in Medicare, Medicaid or managed care program needed by the patient, have an available bed and are willing to accept the patient.  Yes   Patient/family informed of [place]'s ownership interest in [facility] and [facility], as well as of the fact that they are under no obligation to receive care at these facilities.  PASRR submitted to EDS on       PASRR number received on       Existing PASRR number confirmed on 05/07/16     FL2 transmitted to all facilities in geographic area requested by pt/family on 05/07/16     FL2 transmitted to all facilities within larger geographic area on       Patient informed that his/her managed care company has contracts with or will negotiate with certain facilities, including the following:        Yes   Patient/family informed of bed offers received.  Patient chooses bed at [facility]     Physician recommends and patient chooses bed at      Patient to be transferred to [facility] on 05/08/16.  Patient to be transferred to facility by staff     Patient family notified on 05/08/16 of transfer.  Name of family member notified:  [name]- daughter     PHYSICIAN       Additional Comment:  [facility] appro[facility] SNF and is aware  of transfer on 05/08/16.  _______________________________________________ Salome Arnt, [place] 05/08/2016, 1:54 PM [phone]

## 2016-05-08 NOTE — Discharge Summary (Signed)
Physician Discharge Summary  [name] DOB: [birth year] DOA: 05/06/2016  PCP: [name], MD  Admit date: 05/06/2016 Discharge date: 05/08/2016  Time spent: 45 minutes  Recommendations for Outpatient Follow-up:  -We'll be discharged to SNF today. -We'll need to monitor CBGs closely and adjust insulin regimen as needed.   Discharge Diagnoses:  Active Problems:   New onset type 2 diabetes mellitus ([place])   Diabetes mellitus ([place])   Discharge Condition: Stable and improved  Filed Weights   05/06/16 2140 05/07/16 0316  Weight: 59.875 kg (132 lb) 57.7 kg (127 lb 3.3 oz)    History of present illness:  As per Dr. Darrick Meigs on 5/8: Kenneth Potter is a 82 y.o. male, With history of prostate cancer, hyperlipidemia, CAD who was brought to the hospital for increasing swelling of the penis and increased frequency of urination. As per daughter patient has been having pain as well as swelling, dysuria and increased first for past 2 weeks. Patient we are diapers. Patient denies any fever, no chest pain or shortness of breath. No nausea vomiting or diarrhea. No headache or blurred vision.  In the ED patient was found to have blood sugar of 442, started on IV insulin. Patient also empirically given ceftriaxone 1 dose in the ED for possible UTI  Hospital Course:   Hyperglycemia with type 2 diabetes -Patient had a history of prior diet-controlled diabetes, however presented with sugars in the high 200s to 300s. -Has been started on Lantus and this will need to be closely monitored with dose adjustment as needed once at SNF.  Perineal yeast infection -Nystatin powder to penis, groin, scrotum, inner thighs twice daily. -No phimosis present.  Procedures:  None   Consultations:  None  Discharge Instructions  Discharge Instructions    Diet - low sodium heart healthy    Complete by:  As directed      Increase activity slowly    Complete by:  As directed             Medication List    STOP taking these medications        ciprofloxacin 500 MG tablet  Commonly known as:  CIPRO     econazole nitrate 1 % cream     metroNIDAZOLE 500 MG tablet  Commonly known as:  FLAGYL     senna 8.6 MG Tabs tablet  Commonly known as:  SENOKOT      TAKE these medications        acetaminophen 500 MG tablet  Commonly known as:  TYLENOL  Take 1 tablet (500 mg total) by mouth every 6 (six) hours as needed for moderate pain or fever.     amLODipine 5 MG tablet  Commonly known as:  NORVASC  Take 1 tablet (5 mg total) by mouth daily.     donepezil 5 MG tablet  Commonly known as:  ARICEPT  Take 1 tablet (5 mg total) by mouth at bedtime.     feeding supplement (GLUCERNA SHAKE) Liqd  Take 237 mLs by mouth 3 (three) times daily between meals.     HYDROcodone-acetaminophen 5-325 MG tablet  Commonly known as:  NORCO/VICODIN  Take 1 tablet by mouth every 4 (four) hours as needed for moderate pain.     hydrocortisone 2.5 % rectal cream  Commonly known as:  ANUSOL-HC  Place rectally 2 (two) times daily.     insulin glargine 100 UNIT/ML injection  Commonly known as:  LANTUS  Inject 0.1 mLs (10  Units total) into the skin at bedtime.     lubiprostone 8 MCG capsule  Commonly known as:  AMITIZA  Take 1 capsule (8 mcg total) by mouth 2 (two) times daily with a meal.     magnesium citrate Soln  Take 296 mLs (1 Bottle total) by mouth Once PRN for severe constipation.     nystatin powder  Commonly known as:  nystatin  Apply 1 g topically 2 (two) times daily.     omeprazole 40 MG capsule  Commonly known as:  PRILOSEC  Take 40 mg by mouth daily.     polyethylene glycol powder powder  Commonly known as:  GLYCOLAX/MIRALAX  Take 17 g by mouth daily as needed for mild constipation.     senna-docusate 8.6-50 MG tablet  Commonly known as:  Senokot-S  Take 1 tablet by mouth 2 (two) times daily.     simvastatin 20 MG tablet  Commonly known as:  ZOCOR  Take 1  tablet (20 mg total) by mouth at bedtime.       Allergies  Allergen Reactions  . Codeine Nausea Only    Nausea per family  . Aspirin     Can not tolerate in high dosage      The results of significant diagnostics from this hospitalization (including imaging, microbiology, ancillary and laboratory) are listed below for reference.    Significant Diagnostic Studies: No results found.  Microbiology: Recent Results (from the past 240 hour(s))  Urine culture     Status: Abnormal   Collection Time: 05/07/16 12:07 AM  Result Value Ref Range Status   Specimen Description URINE, CLEAN CATCH  Final   Special Requests Normal  Final   Culture MULTIPLE SPECIES PRESENT, SUGGEST RECOLLECTION (A)  Final   Report Status 05/08/2016 FINAL  Final     Labs: Basic Metabolic Panel:  Recent Labs Lab 05/06/16 2358 05/07/16 0344  NA 136 136  K 4.3 3.2*  CL 102 106  CO2 25 22  GLUCOSE 442* 258*  BUN 17 14  CREATININE 1.12 0.83  CALCIUM 9.4 8.7*   Liver Function Tests:  Recent Labs Lab 05/06/16 2358 05/07/16 0344  AST 15 14*  ALT 12* 10*  ALKPHOS 105 90  BILITOT 0.7 0.6  PROT 7.4 6.7  ALBUMIN 3.9 3.5   No results for input(s): LIPASE, AMYLASE in the last 168 hours. No results for input(s): AMMONIA in the last 168 hours. CBC:  Recent Labs Lab 05/06/16 2358 05/07/16 0344  WBC 7.1 6.0  NEUTROABS 3.8  --   HGB 13.5 12.4*  HCT 37.7* 34.3*  MCV 93.3 93.0  PLT 197 189   Cardiac Enzymes: No results for input(s): CKTOTAL, CKMB, CKMBINDEX, TROPONINI in the last 168 hours. BNP: BNP (last 3 results) No results for input(s): BNP in the last 8760 hours.  ProBNP (last 3 results) No results for input(s): PROBNP in the last 8760 hours.  CBG:  Recent Labs Lab 05/07/16 1204 05/07/16 1647 05/07/16 2038 05/08/16 0726 05/08/16 1127  GLUCAP 202* 129* 259* 190* 245*       Signed:  HERNANDEZ ACOSTA,ESTELA  [facility] Pager: [phone] 05/08/2016, 12:35  PM

## 2016-05-08 NOTE — NC FL2 (Signed)
[place] LEVEL OF CARE SCREENING TOOL     IDENTIFICATION  Patient Name: Kenneth Potter Birthdate: 08-03-1926 Sex: male Admission Date (Current Location): 05/06/2016  [facility] and [place] Number:  [employer] and Address:  [facility] [address] [place]      Provider Number: [id number]  Attending Physician Name and Address:  Koleen Nimrod Acost*  Relative Name and Phone Number:       Current Level of Care: Hospital Recommended Level of Care: [place] Prior Approval Number:    Date Approved/Denied:   PASRR Number: [id number]  Discharge Plan: SNF    Current Diagnoses: Patient Active Problem List   Diagnosis Date Noted  . New onset type 2 diabetes mellitus ([place]) 05/07/2016  . Diabetes mellitus ([place]) 05/07/2016  . FTT (failure to thrive) in adult   . Proctitis 10/03/2015  . Hyperlipidemia 10/03/2015  . Colitis   . AKI (acute kidney injury) ([place]) 08/23/2015  . Atypical chest pain 08/22/2015  . Urinary and fecal incontinence 07/05/2015  . Insomnia 02/15/2015  . Lower abdominal pain 02/15/2015  . Hypokalemia 02/08/2015  . Pneumonia 02/08/2015  . Hypotension 10/10/2014  . Constipation 10/08/2014  . Protein-calorie malnutrition, severe ([place]) 09/01/2014  . Chronic diastolic heart failure ([place]) 09/01/2014  . Diarrhea 08/31/2014  . GERD (gastroesophageal reflux disease) 05/05/2013  . OA (osteoarthritis) of knee 05/05/2013  . Dementia 02/04/2013  . Gait instability 12/02/2012  . DM (diabetes mellitus) type II controlled with renal manifestation ([place]) 11/20/2012  . History of prostate cancer 11/20/2012  . CAD (coronary artery disease) 07/05/2012  . Mixed hyperlipidemia   . Essential hypertension   . Unstable angina pectoris ([place]) 07/02/2012  . Bradycardia 02/26/2012  . Dizziness 02/26/2012    Orientation RESPIRATION BLADDER Height & Weight     Self  Normal Incontinent Weight: 127 lb 3.3 oz  (57.7 kg) Height:  5\' 4"  (162.6 cm)  BEHAVIORAL SYMPTOMS/MOOD NEUROLOGICAL BOWEL NUTRITION STATUS  Other (Comment) (n/a)   Incontinent Diet (Dysphagia 1 with thin liquids. carb modified/low sodium heart healthy)  AMBULATORY STATUS COMMUNICATION OF NEEDS Skin   Limited Assist Verbally Other (Comment) (Moisture associated skin damage scrotum, penis, groin)                       Personal Care Assistance Level of Assistance  Bathing, Feeding, Dressing, Total care Bathing Assistance: Limited assistance Feeding assistance: Limited assistance Dressing Assistance: Limited assistance Total Care Assistance: Limited assistance   Functional Limitations Info  Sight, Hearing, Speech Sight Info: Adequate Hearing Info: Adequate Speech Info: Adequate    SPECIAL CARE FACTORS FREQUENCY  PT (By licensed PT)                   Contractures      Additional Factors Info  Code Status, Allergies Code Status Info: DNR Allergies Info: Codeine, Aspirin           Current Medications (05/08/2016):  This is the current hospital active medication list Current Facility-Administered Medications  Medication Dose Route Frequency Provider Last Rate Last Dose  . 0.9 %  sodium chloride infusion   Intravenous Continuous Oswald Hillock, MD      . amLODipine (NORVASC) tablet 5 mg  5 mg Oral Daily Oswald Hillock, MD   5 mg at 05/08/16 [id number]  . donepezil (ARICEPT) tablet 5 mg  5 mg Oral QHS Oswald Hillock, MD   5 mg at  05/07/16 2110  . enoxaparin (LOVENOX) injection 40 mg  40 mg Subcutaneous Q24H Oswald Hillock, MD   40 mg at 05/08/16 0939  . fluconazole (DIFLUCAN) tablet 100 mg  100 mg Oral Daily Oswald Hillock, MD   100 mg at 05/08/16 0938  . insulin aspart (novoLOG) injection 0-9 Units  0-9 Units Subcutaneous TID WC Oswald Hillock, MD   3 Units at 05/08/16 1259  . insulin glargine (LANTUS) injection 10 Units  10 Units Subcutaneous QHS Oswald Hillock, MD   10 Units at 05/07/16 0231  . insulin glargine (LANTUS)  injection 10 Units  10 Units Subcutaneous QHS Oswald Hillock, MD   10 Units at 05/07/16 2114  . nystatin (MYCOSTATIN) powder 1 g  1 g Topical BID Erline Hau, MD   1 g at 05/08/16 1000  . ondansetron (ZOFRAN) tablet 4 mg  4 mg Oral Q6H PRN Oswald Hillock, MD       Or  . ondansetron (ZOFRAN) injection 4 mg  4 mg Intravenous Q6H PRN Oswald Hillock, MD      . pantoprazole (PROTONIX) EC tablet 40 mg  40 mg Oral Daily Oswald Hillock, MD   40 mg at 05/08/16 0938  . polyethylene glycol (MIRALAX / GLYCOLAX) packet 17 g  17 g Oral Daily PRN Erline Hau, MD      . senna-docusate (Senokot-S) tablet 1 tablet  1 tablet Oral BID Oswald Hillock, MD   1 tablet at 05/08/16 [phone]  . simvastatin (ZOCOR) tablet 20 mg  20 mg Oral QHS Oswald Hillock, MD   20 mg at 05/07/16 2110     Discharge Medications: Please see discharge summary for a list of discharge medications.  Relevant Imaging Results:  Relevant Lab Results:   Additional Information  SSN [id number]. [facility].   Benay Pike Hunter, [place]

## 2016-05-08 NOTE — Care Management Note (Signed)
Case Management Note  Patient Details  Name: Kenneth Potter MRN: [id number] Date of Birth: [birth year]  Subjective/Objective:                    Action/Plan: [place]   Expected Discharge Date:     05/08/2016             Expected Discharge Plan:  [place]  In-House Referral:     Discharge planning Services  CM Consult  Post Acute Care Choice:  Resumption of Svcs/PTA Provider Choice offered to:     DME Arranged:    DME Agency:     HH Arranged:    [place] Agency:   ([facility])  Status of Service:  Completed, signed off  Medicare Important Message Given:    Date Medicare IM Given:    Medicare IM give by:    Date Additional Medicare IM Given:    Additional Medicare Important Message give by:     If discussed at [place] of Stay Meetings, dates discussed:    Additional Comments:  Alvie Heidelberg, RN 05/08/2016, 12:25 PM

## 2016-05-09 ENCOUNTER — Other Ambulatory Visit: Payer: Self-pay

## 2016-05-09 MED ORDER — HYDROCODONE-ACETAMINOPHEN 5-325 MG PO TABS: 1.0000 | ORAL_TABLET | ORAL | Status: DC | PRN

## 2016-05-09 NOTE — Telephone Encounter (Signed)
Rx fax to [facility], Phone number [phone] DO NOT EXCEED 3 GMS APAP/ 24 HOURS FROM ALL SOURCES

## 2016-05-22 ENCOUNTER — Ambulatory Visit: Payer: Medicare Other | Admitting: Cardiology

## 2016-07-17 ENCOUNTER — Ambulatory Visit
Admission: RE | Admit: 2016-07-17 | Discharge: 2016-07-17 | Disposition: A | Discharge status: Home / Self Care | Payer: Medicare (Managed Care) | Source: Ambulatory Visit | Attending: Family Medicine | Admitting: Family Medicine

## 2016-07-17 ENCOUNTER — Other Ambulatory Visit: Payer: Self-pay | Admitting: Family Medicine

## 2016-07-17 DIAGNOSIS — K5909 Other constipation: Secondary | ICD-10-CM

## 2017-11-24 ENCOUNTER — Observation Stay (HOSPITAL_COMMUNITY): Payer: Medicare (Managed Care)

## 2017-11-24 ENCOUNTER — Encounter (HOSPITAL_COMMUNITY): Payer: Self-pay

## 2017-11-24 ENCOUNTER — Observation Stay (HOSPITAL_COMMUNITY)
Admission: EM | Admit: 2017-11-24 | Discharge: 2017-11-27 | Disposition: A | Discharge status: Home / Self Care | Payer: Medicare (Managed Care) | Attending: Family Medicine | Admitting: Family Medicine

## 2017-11-24 ENCOUNTER — Other Ambulatory Visit: Payer: Self-pay

## 2017-11-24 DIAGNOSIS — R001 Bradycardia, unspecified: Secondary | ICD-10-CM | POA: Diagnosis not present

## 2017-11-24 DIAGNOSIS — I2510 Atherosclerotic heart disease of native coronary artery without angina pectoris: Secondary | ICD-10-CM | POA: Diagnosis not present

## 2017-11-24 DIAGNOSIS — Z794 Long term (current) use of insulin: Secondary | ICD-10-CM | POA: Diagnosis not present

## 2017-11-24 DIAGNOSIS — I5032 Chronic diastolic (congestive) heart failure: Secondary | ICD-10-CM | POA: Insufficient documentation

## 2017-11-24 DIAGNOSIS — Z79899 Other long term (current) drug therapy: Secondary | ICD-10-CM | POA: Insufficient documentation

## 2017-11-24 DIAGNOSIS — Z8546 Personal history of malignant neoplasm of prostate: Secondary | ICD-10-CM | POA: Insufficient documentation

## 2017-11-24 DIAGNOSIS — E1165 Type 2 diabetes mellitus with hyperglycemia: Secondary | ICD-10-CM | POA: Diagnosis not present

## 2017-11-24 DIAGNOSIS — I110 Hypertensive heart disease with heart failure: Secondary | ICD-10-CM | POA: Insufficient documentation

## 2017-11-24 DIAGNOSIS — R55 Syncope and collapse: Secondary | ICD-10-CM | POA: Diagnosis not present

## 2017-11-24 DIAGNOSIS — Z66 Do not resuscitate: Secondary | ICD-10-CM | POA: Insufficient documentation

## 2017-11-24 DIAGNOSIS — Z9049 Acquired absence of other specified parts of digestive tract: Secondary | ICD-10-CM | POA: Insufficient documentation

## 2017-11-24 DIAGNOSIS — F0390 Unspecified dementia without behavioral disturbance: Secondary | ICD-10-CM | POA: Diagnosis not present

## 2017-11-24 DIAGNOSIS — Z87891 Personal history of nicotine dependence: Secondary | ICD-10-CM | POA: Diagnosis not present

## 2017-11-24 DIAGNOSIS — F419 Anxiety disorder, unspecified: Secondary | ICD-10-CM | POA: Diagnosis not present

## 2017-11-24 DIAGNOSIS — N289 Disorder of kidney and ureter, unspecified: Secondary | ICD-10-CM | POA: Diagnosis not present

## 2017-11-24 HISTORY — DX: Dysphagia, unspecified: R13.10

## 2017-11-24 HISTORY — DX: Type 2 diabetes mellitus without complications: E11.9

## 2017-11-24 HISTORY — DX: Unspecified dementia, unspecified severity, without behavioral disturbance, psychotic disturbance, mood disturbance, and anxiety: F03.90

## 2017-11-24 LAB — TROPONIN I

## 2017-11-24 LAB — COMPREHENSIVE METABOLIC PANEL
ALK PHOS: 98 U/L (ref 38–126)
ALT: 11 U/L — ABNORMAL LOW (ref 17–63)
ANION GAP: 9 (ref 5–15)
AST: 18 U/L (ref 15–41)
Albumin: 3.8 g/dL (ref 3.5–5.0)
BUN: 13 mg/dL (ref 6–20)
CALCIUM: 9.3 mg/dL (ref 8.9–10.3)
CHLORIDE: 105 mmol/L (ref 101–111)
CO2: 24 mmol/L (ref 22–32)
Creatinine, Ser: 1.46 mg/dL — ABNORMAL HIGH (ref 0.61–1.24)
GFR calc non Af Amer: 41 mL/min — ABNORMAL LOW (ref >60)
GFR, EST AFRICAN AMERICAN: 47 mL/min — AB (ref >60)
Glucose, Bld: 199 mg/dL — ABNORMAL HIGH (ref 65–99)
Potassium: 3.6 mmol/L (ref 3.5–5.1)
SODIUM: 138 mmol/L (ref 135–145)
Total Bilirubin: 0.7 mg/dL (ref 0.3–1.2)
Total Protein: 7.3 g/dL (ref 6.5–8.1)

## 2017-11-24 LAB — CBC WITH DIFFERENTIAL/PLATELET
BASOS PCT: 0 %
Basophils Absolute: 0 10*3/uL (ref 0.0–0.1)
Eosinophils Absolute: 0.1 10*3/uL (ref 0.0–0.7)
Eosinophils Relative: 2 %
HEMATOCRIT: 45.6 % (ref 39.0–52.0)
Hemoglobin: 15 g/dL (ref 13.0–17.0)
Lymphocytes Relative: 16 %
Lymphs Abs: 1.2 10*3/uL (ref 0.7–4.0)
MCH: 32.3 pg (ref 26.0–34.0)
MCHC: 32.9 g/dL (ref 30.0–36.0)
MCV: 98.1 fL (ref 78.0–100.0)
MONOS PCT: 6 %
Monocytes Absolute: 0.4 10*3/uL (ref 0.1–1.0)
NEUTROS ABS: 5.6 10*3/uL (ref 1.7–7.7)
NEUTROS PCT: 76 %
PLATELETS: 182 10*3/uL (ref 150–400)
RBC: 4.65 MIL/uL (ref 4.22–5.81)
RDW: 13.2 % (ref 11.5–15.5)
WBC: 7.4 10*3/uL (ref 4.0–10.5)

## 2017-11-24 MED ORDER — ACETAMINOPHEN 650 MG RE SUPP: 650.0000 mg | Freq: Four times a day (QID) | RECTAL | Status: DC | PRN

## 2017-11-24 MED ORDER — BISACODYL 5 MG PO TBEC
5.0000 mg | DELAYED_RELEASE_TABLET | Freq: Every day | ORAL | Status: DC | PRN
Start: 2017-11-24 — End: 2017-11-27

## 2017-11-24 MED ORDER — ONDANSETRON HCL 4 MG/2ML IJ SOLN: 4.0000 mg | Freq: Four times a day (QID) | INTRAMUSCULAR | Status: DC | PRN

## 2017-11-24 MED ORDER — SENNOSIDES-DOCUSATE SODIUM 8.6-50 MG PO TABS: 1.0000 | ORAL_TABLET | Freq: Every evening | ORAL | Status: DC | PRN

## 2017-11-24 MED ORDER — AMLODIPINE BESYLATE 5 MG PO TABS
5.0000 mg | ORAL_TABLET | Freq: Every day | ORAL | Status: DC
  Administered 2017-11-25 – 2017-11-27 (×3): 5 mg via ORAL
  Filled 2017-11-24 (×3): qty 1

## 2017-11-24 MED ORDER — INSULIN ASPART 100 UNIT/ML ~~LOC~~ SOLN
0.0000 [IU] | Freq: Three times a day (TID) | SUBCUTANEOUS | Status: DC
  Administered 2017-11-25 – 2017-11-27 (×4): 1 [IU] via SUBCUTANEOUS

## 2017-11-24 MED ORDER — ACETAMINOPHEN 325 MG PO TABS: 650.0000 mg | ORAL_TABLET | Freq: Four times a day (QID) | ORAL | Status: DC | PRN

## 2017-11-24 MED ORDER — SODIUM CHLORIDE 0.9 % IV SOLN
INTRAVENOUS | Status: DC
  Administered 2017-11-24 – 2017-11-26 (×2): via INTRAVENOUS

## 2017-11-24 MED ORDER — ONDANSETRON HCL 4 MG PO TABS: 4.0000 mg | ORAL_TABLET | Freq: Four times a day (QID) | ORAL | Status: DC | PRN

## 2017-11-24 NOTE — ED Notes (Signed)
Attempted to obtain urine via in and out cath. Pt brief was wet w foul smelling urine. Unable to obtain urine
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
## 2017-11-24 NOTE — ED Notes (Signed)
Pt incontinent of urine at this time, unable to get urine

## 2017-11-24 NOTE — ED Notes (Signed)
Family feeding applesauce. Reports pt is on pureed diet at home
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
## 2017-11-25 ENCOUNTER — Encounter (HOSPITAL_COMMUNITY): Payer: Self-pay | Admitting: Cardiology

## 2017-11-25 DIAGNOSIS — E1165 Type 2 diabetes mellitus with hyperglycemia: Secondary | ICD-10-CM | POA: Diagnosis not present

## 2017-11-25 DIAGNOSIS — I25119 Atherosclerotic heart disease of native coronary artery with unspecified angina pectoris: Secondary | ICD-10-CM | POA: Diagnosis not present

## 2017-11-25 DIAGNOSIS — R55 Syncope and collapse: Secondary | ICD-10-CM | POA: Diagnosis not present

## 2017-11-25 DIAGNOSIS — R001 Bradycardia, unspecified: Secondary | ICD-10-CM

## 2017-11-25 LAB — COMPREHENSIVE METABOLIC PANEL
ALBUMIN: 3.3 g/dL — AB (ref 3.5–5.0)
ALK PHOS: 91 U/L (ref 38–126)
ALT: 9 U/L — AB (ref 17–63)
ANION GAP: 7 (ref 5–15)
AST: 14 U/L — ABNORMAL LOW (ref 15–41)
BUN: 11 mg/dL (ref 6–20)
CALCIUM: 8.8 mg/dL — AB (ref 8.9–10.3)
CHLORIDE: 108 mmol/L (ref 101–111)
CO2: 22 mmol/L (ref 22–32)
CREATININE: 0.94 mg/dL (ref 0.61–1.24)
GFR calc non Af Amer: >60 mL/min (ref >60)
GLUCOSE: 132 mg/dL — AB (ref 65–99)
Potassium: 3.6 mmol/L (ref 3.5–5.1)
SODIUM: 137 mmol/L (ref 135–145)
Total Bilirubin: 1 mg/dL (ref 0.3–1.2)
Total Protein: 6.5 g/dL (ref 6.5–8.1)

## 2017-11-25 LAB — CBC
HCT: 42.8 % (ref 39.0–52.0)
HEMOGLOBIN: 14.4 g/dL (ref 13.0–17.0)
MCH: 32.4 pg (ref 26.0–34.0)
MCHC: 33.6 g/dL (ref 30.0–36.0)
MCV: 96.4 fL (ref 78.0–100.0)
Platelets: 187 10*3/uL (ref 150–400)
RBC: 4.44 MIL/uL (ref 4.22–5.81)
RDW: 12.9 % (ref 11.5–15.5)
WBC: 6.4 10*3/uL (ref 4.0–10.5)

## 2017-11-25 LAB — GLUCOSE, CAPILLARY
GLUCOSE-CAPILLARY: 118 mg/dL — AB (ref 65–99)
GLUCOSE-CAPILLARY: 130 mg/dL — AB (ref 65–99)
Glucose-Capillary: 127 mg/dL — ABNORMAL HIGH (ref 65–99)
Glucose-Capillary: 110 mg/dL — ABNORMAL HIGH (ref 65–99)
Glucose-Capillary: 153 mg/dL — ABNORMAL HIGH (ref 65–99)

## 2017-11-25 LAB — TSH: TSH: 0.962 u[IU]/mL (ref 0.350–4.500)

## 2017-11-25 LAB — TROPONIN I: Troponin I: <0.03 ng/mL (ref <0.03)

## 2017-11-25 NOTE — Progress Notes (Addendum)
PROGRESS NOTE  Kenneth Potter:[id number] DOB: 1926-03-17 DOA: 11/24/2017 PCP: No primary care provider on file.  Brief History:  83 year old male with a history of dementia, diabetes mellitus, hypertension, nonobstructive CAD, and diastolic dysfunction presented after a syncopal episode.  The patient was sitting at the dinner table eating dinner with his daughters when he slumped over and became unresponsive.  His daughter to try to wake him up, but he was not responsive.  He lost consciousness for approximately 5-7 minutes according to his daughter.  EMS was activated.  Upon arrival, the patient was noted to have bradycardia with heart rate in the 40s, and his CBG was 231.  His daughter stated that when he woke up, his speech appeared to be slurred, but this resolved after a few minutes.  The patient did not have any prodromal symptoms including dizziness, chest discomfort, or shortness of breath.  There was no witnessed tonic-clonic activity nor bladder or bowel incontinence.  In the emergency department, the patient was afebrile hemodynamically stable.  CT of the brain was negative.  BMP and CBC were essentially unremarkable except for serum creatinine 1.46.  Orthostatics were negative initially.  Assessment/Plan: Syncope -Concerned about symptomatic bradycardia -Echocardiogram -TSH--0.962 -Cardiology consult -Orthostatic vital signs negative -EEG -remain on tele  Bradycardia -poor candidate for invasive interventions -check for chronotropic incompetence -avoiding AV nodal agents  Acute on chronic renal failure--CKD3 -Secondary to volume depletion -Baseline creatinine 0.8-1.1 -serum creatinine peaked 1.42 -improved with IVF--continue another 24 hours  Diabetes mellitus type 2  -NovoLog sliding scale -Hemoglobin A1c -Holding glipizide -May 07, 2016 hemoglobin A1c 11.6  Essential hypertension -Continue amlodipine 5 mg daily--may need to increase to 10 mg  Coronary  artery disease -No chest pain presently -Personally reviewed EKG--sinus bradycardia, nonspecific T wave change    Disposition Plan:   Home when cleared by cardiology, likely 11/27 Family Communication:   Daughter updated on phone 11/26--Total time spent 35 minutes.  Greater than 50% spent face to face counseling and coordinating care.   Consultants:  cardiology Code Status:  DNR  DVT Prophylaxis:  SCDs   Procedures: As Listed in Progress Note Above  Antibiotics: None    Subjective: Patient is presently confused, but denies any fevers, chills, headache, chest pain, shortness breath, abdominal pain.  Objective: Vitals:   11/24/17 2000 11/24/17 2103 11/25/17 0458 11/25/17 0600  BP:  (!) 167/84 (!) 176/86   Pulse:  73 67   Resp:  20 18   Temp:   98.4 F (36.9 C)   TempSrc:   Axillary   SpO2:  100% 95%   Weight: 58.4 kg (128 lb 12.8 oz)   58.4 kg (128 lb 12.8 oz)  Height: 5\' 4"  (1.626 m)   5\' 4"  (1.626 m)    Intake/Output Summary (Last 24 hours) at 11/25/2017 1010 Last data filed at 11/25/2017 1660 Gross per 24 hour  Intake 438.75 ml  Output 1 ml  Net 437.75 ml   Weight change:  Exam:   General:  Pt is alert, follows commands appropriately, not in acute distress  HEENT: No icterus, No thrush, No neck mass, [place]/AT  Cardiovascular: RRR, S1/S2, no rubs, no gallops  Respiratory: CTA bilaterally, no wheezing, no crackles, no rhonchi  Abdomen: Soft/+BS, non tender, non distended, no guarding  Extremities: No edema, No lymphangitis, No petechiae, No rashes, no synovitis   Data Reviewed: I have personally reviewed following labs and imaging studies Basic Metabolic  Panel: Recent Labs  Lab 11/24/17 1555 11/25/17 0638  NA 138 137  K 3.6 3.6  CL 105 108  CO2 24 22  GLUCOSE 199* 132*  BUN 13 11  CREATININE 1.46* 0.94  CALCIUM 9.3 8.8*   Liver Function Tests: Recent Labs  Lab 11/24/17 1555 11/25/17 0638  AST 18 14*  ALT 11* 9*  ALKPHOS 98 91    BILITOT 0.7 1.0  PROT 7.3 6.5  ALBUMIN 3.8 3.3*   No results for input(s): LIPASE, AMYLASE in the last 168 hours. No results for input(s): AMMONIA in the last 168 hours. Coagulation Profile: No results for input(s): INR, PROTIME in the last 168 hours. CBC: Recent Labs  Lab 11/24/17 1555 11/25/17 0638  WBC 7.4 6.4  NEUTROABS 5.6  --   HGB 15.0 14.4  HCT 45.6 42.8  MCV 98.1 96.4  PLT 182 187   Cardiac Enzymes: Recent Labs  Lab 11/24/17 1555 11/25/17 0638  TROPONINI <0.03 <0.03   BNP: Invalid input(s): POCBNP CBG: Recent Labs  Lab 11/25/17 0838  GLUCAP 127*   HbA1C: No results for input(s): HGBA1C in the last 72 hours. Urine analysis:    Component Value Date/Time   COLORURINE YELLOW 05/06/2016 2310   APPEARANCEUR HAZY (A) 05/06/2016 2310   LABSPEC 1.010 05/06/2016 2310   PHURINE 5.5 05/06/2016 2310   GLUCOSEU >1000 (A) 05/06/2016 2310   HGBUR MODERATE (A) 05/06/2016 2310   BILIRUBINUR NEGATIVE 05/06/2016 2310   KETONESUR NEGATIVE 05/06/2016 2310   PROTEINUR NEGATIVE 05/06/2016 2310   UROBILINOGEN 1.0 10/03/2015 0517   NITRITE NEGATIVE 05/06/2016 2310   LEUKOCYTESUR SMALL (A) 05/06/2016 2310   Sepsis Labs: @LABRCNTIP (procalcitonin:4,lacticidven:4) )No results found for this or any previous visit (from the past 240 hour(s)).   Scheduled Meds: . amLODipine  5 mg Oral Daily  . insulin aspart  0-9 Units Subcutaneous TID WC   Continuous Infusions: . sodium chloride 75 mL/hr at 11/24/17 2345    Procedures/Studies: Ct Head Wo Contrast  Result Date: 11/24/2017 CLINICAL DATA:  Syncope and fainting EXAM: CT HEAD WITHOUT CONTRAST TECHNIQUE: Contiguous axial images were obtained from the base of the skull through the vertex without intravenous contrast. COMPARISON:  Head CT 05/29/2011 FINDINGS: Brain: No mass lesion, intraparenchymal hemorrhage or extra-axial collection. No evidence of acute cortical infarct. There is periventricular hypoattenuation compatible  with chronic microvascular disease. Generalized atrophy. Vascular: No hyperdense vessel or unexpected calcification. Skull: Normal visualized skull base, calvarium and extracranial soft tissues. Sinuses/Orbits: No sinus fluid levels or advanced mucosal thickening. No mastoid effusion. Normal orbits. IMPRESSION: Generalized atrophy and advanced chronic ischemic microangiopathy without acute intracranial abnormality. Electronically Signed   By: Ulyses Jarred M.D.   On: 11/24/2017 23:00    Brevon Dewald, DO  [facility] Pager [phone]  If 7PM-7AM, please contact night-coverage [email or web address] Password [email or web address] 11/25/2017, 10:10 AM   LOS: 0 days
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
## 2017-11-26 ENCOUNTER — Observation Stay (HOSPITAL_COMMUNITY)
Admit: 2017-11-26 | Discharge: 2017-11-26 | Disposition: A | Discharge status: Home / Self Care | Payer: Medicare (Managed Care) | Attending: Internal Medicine | Admitting: Internal Medicine

## 2017-11-26 ENCOUNTER — Observation Stay (HOSPITAL_BASED_OUTPATIENT_CLINIC_OR_DEPARTMENT_OTHER): Payer: Medicare (Managed Care)

## 2017-11-26 DIAGNOSIS — E1165 Type 2 diabetes mellitus with hyperglycemia: Secondary | ICD-10-CM | POA: Diagnosis not present

## 2017-11-26 DIAGNOSIS — R55 Syncope and collapse: Secondary | ICD-10-CM | POA: Diagnosis not present

## 2017-11-26 DIAGNOSIS — R001 Bradycardia, unspecified: Secondary | ICD-10-CM | POA: Diagnosis not present

## 2017-11-26 LAB — COMPREHENSIVE METABOLIC PANEL
ALBUMIN: 3 g/dL — AB (ref 3.5–5.0)
ALT: 9 U/L — ABNORMAL LOW (ref 17–63)
ANION GAP: 4 — AB (ref 5–15)
AST: 13 U/L — AB (ref 15–41)
Alkaline Phosphatase: 77 U/L (ref 38–126)
BUN: 16 mg/dL (ref 6–20)
CHLORIDE: 112 mmol/L — AB (ref 101–111)
CO2: 23 mmol/L (ref 22–32)
Calcium: 8.6 mg/dL — ABNORMAL LOW (ref 8.9–10.3)
Creatinine, Ser: 1.08 mg/dL (ref 0.61–1.24)
GFR calc Af Amer: >60 mL/min (ref >60)
GFR calc non Af Amer: 59 mL/min — ABNORMAL LOW (ref >60)
GLUCOSE: 106 mg/dL — AB (ref 65–99)
POTASSIUM: 3.7 mmol/L (ref 3.5–5.1)
SODIUM: 139 mmol/L (ref 135–145)
Total Bilirubin: 0.7 mg/dL (ref 0.3–1.2)
Total Protein: 5.9 g/dL — ABNORMAL LOW (ref 6.5–8.1)

## 2017-11-26 LAB — GLUCOSE, CAPILLARY
GLUCOSE-CAPILLARY: 115 mg/dL — AB (ref 65–99)
GLUCOSE-CAPILLARY: 112 mg/dL — AB (ref 65–99)
GLUCOSE-CAPILLARY: 182 mg/dL — AB (ref 65–99)
Glucose-Capillary: 103 mg/dL — ABNORMAL HIGH (ref 65–99)

## 2017-11-26 LAB — ECHOCARDIOGRAM COMPLETE
CHL CUP MV DEC (S): 401
CHL CUP RV SYS PRESS: 33 mmHg
E decel time: 401 msec
E/e' ratio: 9.6
FS: 13 % — AB (ref 28–44)
Height: 64 in
IVS/LV PW RATIO, ED: 0.97
LA vol A4C: 31.8 ml
LA vol: 31 mL
LADIAMINDEX: 1.84 cm/m2
LASIZE: 30 mm
LAVOLIN: 19 mL/m2
LEFT ATRIUM END SYS DIAM: 30 mm
LV PW d: 11.9 mm — AB (ref 0.6–1.1)
LV TDI E'MEDIAL: 5.77
LVEEAVG: 9.6
LVEEMED: 9.6
LVELAT: 6.42 cm/s
LVOT area: 3.46 cm2
LVOT diameter: 21 mm
Lateral S' vel: 23.5 cm/s
MV pk A vel: 110 m/s
MVPKEVEL: 61.6 m/s
P 1/2 time: 548 ms
RV TAPSE: 26.9 mm
Reg peak vel: 275 cm/s
TDI e' lateral: 6.42
TRMAXVEL: 275 cm/s
Weight: 2060.79 oz

## 2017-11-26 LAB — CBC
HEMATOCRIT: 41.5 % (ref 39.0–52.0)
HEMOGLOBIN: 13.7 g/dL (ref 13.0–17.0)
MCH: 32.4 pg (ref 26.0–34.0)
MCHC: 33 g/dL (ref 30.0–36.0)
MCV: 98.1 fL (ref 78.0–100.0)
Platelets: 177 10*3/uL (ref 150–400)
RBC: 4.23 MIL/uL (ref 4.22–5.81)
RDW: 13.3 % (ref 11.5–15.5)
WBC: 7.1 10*3/uL (ref 4.0–10.5)

## 2017-11-26 LAB — HEMOGLOBIN A1C
Hgb A1c MFr Bld: 6.6 % — ABNORMAL HIGH (ref 4.8–5.6)
Hgb A1c MFr Bld: 6.3 % — ABNORMAL HIGH (ref 4.8–5.6)
MEAN PLASMA GLUCOSE: 134.11 mg/dL
Mean Plasma Glucose: 143 mg/dL

## 2017-11-26 LAB — MAGNESIUM: Magnesium: 1.9 mg/dL (ref 1.7–2.4)

## 2017-11-26 NOTE — Care Management Note (Signed)
Case Management Note  Patient Details  Name: Kenneth Potter MRN: [id number] Date of Birth: 1926/01/09  Subjective/Objective:   Adm with syncope. From  Home with daughter. Patient is active with [facility] and is provided with 24/7 supervision. He will be evaluated for PT services through [facility] once discharged as [facility] provided all needs for patient. Left voicemail with Seth Bake of [facility].                 Action/Plan:Anticipate DC home today pending ECHO results.    Expected Discharge Date:  11/25/17               Expected Discharge Plan:  Home/Self Care  In-House Referral:     Discharge planning Services  CM Consult  Post Acute Care Choice:  NA Choice offered to:  NA  DME Arranged:    DME Agency:     HH Arranged:    HH Agency:     Status of Service:  Completed, signed off  If discussed at [employer] of Stay Meetings, dates discussed:    Additional Comments:  [name], [name], RN 11/26/2017, 1:07 PM

## 2017-11-26 NOTE — Plan of Care (Signed)
Pt alert  and oriented to self and family members. No complaint of pain or distress at this time. 22G to left thumb, NS at 69ml/hr. Needs anticipated. Bed alarm in place. Bed in lowest position, call light within reach. Will continue to monitor.

## 2017-11-26 NOTE — Progress Notes (Signed)
  Speech Language Pathology Treatment: Dysphagia  Patient Details Name: Kenneth Potter MRN: [id number] DOB: Apr 10, 1926 Today's Date: 11/26/2017 Time: 0370-4888 SLP Time Calculation (min) (ACUTE ONLY): 17 min  Assessment / Plan / Recommendation Clinical Impression  Pt seen with PM meal and personal sitter in room. Pt able to self feed slowly. Mild lingual residue noted with D2 and Pt benefited from alternating solids/liquids. Pt's personal sitter commented that Pt was "coughing" when eating D2 prior to SLP arrival and SLP encouraged sitter to have Pt self feed as much as possible and/or decrease rate of presentations if feeding Pt. Continue diet as ordered.    HPI HPI: 83 year old male with a history of dementia, diabetes mellitus, hypertension, nonobstructive CAD, and diastolic dysfunction presented after a syncopal episode.  The patient was sitting at the dinner table eating dinner with his daughters when he slumped over and became unresponsive.  His daughter to try to wake him up, but he was not responsive.  He lost consciousness for approximately 5-7 minutes according to his daughter.  EMS was activated.  Upon arrival, the patient was noted to have bradycardia with heart rate in the 40s, and his CBG was 231.  His daughter stated that when he woke up, his speech appeared to be slurred, but this resolved after a few minutes.  The patient did not have any prodromal symptoms including dizziness, chest discomfort, or shortness of breath.  There was no witnessed tonic-clonic activity nor bladder or bowel incontinence.  In the emergency department, the patient was afebrile hemodynamically stable.  CT of the brain was negative.  BMP and CBC were essentially unremarkable except for serum creatinine 1.46.  Orthostatics were negative initially.      SLP Plan  Continue with current plan of care       Recommendations  Diet recommendations: Dysphagia 2 (fine chop);Thin liquid Liquids provided via:  Cup;Straw Medication Administration: Whole meds with liquid Supervision: Staff to assist with self feeding;Full supervision/cueing for compensatory strategies Compensations: Slow rate;Small sips/bites Postural Changes and/or Swallow Maneuvers: Seated upright 90 degrees;Upright 30-60 min after meal                Oral Care Recommendations: Oral care BID;Staff/trained caregiver to provide oral care Follow up Recommendations: 24 hour supervision/assistance SLP Visit Diagnosis: Dysphagia, oropharyngeal phase (R13.12) Plan: Continue with current plan of care       GO              Thank you,  Genene Churn, [place]   [place] 11/26/2017, 5:30 PM

## 2017-11-26 NOTE — Plan of Care (Signed)
  Acute Rehab PT Goals(only PT should resolve) Pt Will Go Supine/Side To Sit 11/26/2017 1219 - Progressing by Lonell Grandchild, PT Flowsheets Taken 11/26/2017 1219  Pt will go Supine/Side to Sit with min guard assist Patient Will Transfer Sit To/From Stand 11/26/2017 1219 - Progressing by Lonell Grandchild, PT Flowsheets Taken 11/26/2017 1219  Patient will transfer sit to/from stand with min guard assist Pt Will Transfer Bed To Chair/Chair To Bed 11/26/2017 1219 - Progressing by Lonell Grandchild, PT Flowsheets Taken 11/26/2017 1219  Pt will Transfer Bed to Chair/Chair to Bed min guard assist Pt Will Ambulate 11/26/2017 1219 - Progressing by Lonell Grandchild, PT Flowsheets Taken 11/26/2017 1219  Pt will Ambulate 100 feet;with rolling walker;with min guard assist  12:20 PM, 11/26/17 Lonell Grandchild, MPT Physical Therapist with [facility] 336 [phone] office [phone] mobile phone

## 2017-11-26 NOTE — Progress Notes (Signed)
PROGRESS NOTE  Kenneth Potter ZOX:[id number] DOB: 07/15/1926 DOA: 11/24/2017 PCP: No primary care provider on file.  Brief History:  83 year old male with a history of dementia, diabetes mellitus, hypertension, nonobstructive CAD, and diastolic dysfunction presented after a syncopal episode.  The patient was sitting at the dinner table eating dinner with his daughters when he slumped over and became unresponsive.  His daughter to try to wake him up, but he was not responsive.  He lost consciousness for approximately 5-7 minutes according to his daughter.  EMS was activated.  Upon arrival, the patient was noted to have bradycardia with heart rate in the 40s, and his CBG was 231.  His daughter stated that when he woke up, his speech appeared to be slurred, but this resolved after a few minutes.  The patient did not have any prodromal symptoms including dizziness, chest discomfort, or shortness of breath.  There was no witnessed tonic-clonic activity nor bladder or bowel incontinence.  In the emergency department, the patient was afebrile hemodynamically stable.  CT of the brain was negative.  BMP and CBC were essentially unremarkable except for serum creatinine 1.46.  Orthostatics were negative initially.  Assessment/Plan: Syncope -Concerned about symptomatic bradycardia -Echocardiogram--pending -TSH--0.962 -Cardiology consult appreciated--no indication for PPM presently, repeat orthostatics -Orthostatic vital signs negative initially -EEG--awaiting read by radiology -remain on tele--no AV block  Bradycardia -poor candidate for invasive interventions -check for chronotropic incompetence -avoiding AV nodal agents  Acute on chronic renal failure--CKD3 -Secondary to volume depletion -Baseline creatinine 0.8-1.1 -serum creatinine peaked 1.42 -improved with IVF  Diabetes mellitus type 2  -NovoLog sliding scale -Hemoglobin A1c--6.3 -Holding glipizide  Essential  hypertension -Continue amlodipine 5 mg daily  Coronary artery disease -No chest pain presently -Personally reviewed EKG--sinus bradycardia, nonspecific T wave change    Disposition Plan:   Home 11/28 of stab;e Family Communication:   left voicemail for daughter  Consultants:  cardiology Code Status:  DNR  DVT Prophylaxis:  SCDs   Procedures: As Listed in Progress Note Above  Antibiotics: None      Subjective: Patient denies fevers, chills, headache, chest pain, dyspnea, nausea, vomiting, diarrhea, abdominal pain, dysuria, hematuria, hematochezia, and melena.   Objective: Vitals:   11/25/17 0458 11/25/17 0600 11/25/17 2126 11/26/17 0700  BP: (!) 176/86  (!) 142/61 132/74  Pulse: 67  64 66  Resp: 18  18 18   Temp: 98.4 F (36.9 C)  98 F (36.7 C) 98.2 F (36.8 C)  TempSrc: Axillary  Oral Oral  SpO2: 95%  96% 95%  Weight:  58.4 kg (128 lb 12.8 oz)    Height:  5\' 4"  (1.626 m)      Intake/Output Summary (Last 24 hours) at 11/26/2017 1330 Last data filed at 11/26/2017 0701 Gross per 24 hour  Intake 847.5 ml  Output 200 ml  Net 647.5 ml   Weight change:  Exam:   General:  Pt is alert, follows commands appropriately, not in acute distress  HEENT: No icterus, No thrush, No neck mass, [place]/AT  Cardiovascular: RRR, S1/S2, no rubs, no gallops  Respiratory: CTA bilaterally, no wheezing, no crackles, no rhonchi  Abdomen: Soft/+BS, non tender, non distended, no guarding  Extremities: No edema, No lymphangitis, No petechiae, No rashes, no synovitis   Data Reviewed: I have personally reviewed following labs and imaging studies Basic Metabolic Panel: Recent Labs  Lab 11/24/17 1555 11/25/17 0638 11/26/17 0508  NA 138 137 139  K 3.6 3.6  3.7  CL 105 108 112*  CO2 24 22 23   GLUCOSE 199* 132* 106*  BUN 13 11 16   CREATININE 1.46* 0.94 1.08  CALCIUM 9.3 8.8* 8.6*  MG  --   --  1.9   Liver Function Tests: Recent Labs  Lab 11/24/17 1555  11/25/17 0638 11/26/17 0508  AST 18 14* 13*  ALT 11* 9* 9*  ALKPHOS 98 91 77  BILITOT 0.7 1.0 0.7  PROT 7.3 6.5 5.9*  ALBUMIN 3.8 3.3* 3.0*   No results for input(s): LIPASE, AMYLASE in the last 168 hours. No results for input(s): AMMONIA in the last 168 hours. Coagulation Profile: No results for input(s): INR, PROTIME in the last 168 hours. CBC: Recent Labs  Lab 11/24/17 1555 11/25/17 0638 11/26/17 0508  WBC 7.4 6.4 7.1  NEUTROABS 5.6  --   --   HGB 15.0 14.4 13.7  HCT 45.6 42.8 41.5  MCV 98.1 96.4 98.1  PLT 182 187 177   Cardiac Enzymes: Recent Labs  Lab 11/24/17 1555 11/25/17 0638  TROPONINI <0.03 <0.03   BNP: Invalid input(s): POCBNP CBG: Recent Labs  Lab 11/25/17 1212 11/25/17 1708 11/25/17 2153 11/26/17 0726 11/26/17 1101  GLUCAP 130* 110* 153* 103* 115*   HbA1C: Recent Labs    11/25/17 0638 11/26/17 0508  HGBA1C 6.6* 6.3*   Urine analysis:    Component Value Date/Time   COLORURINE YELLOW 05/06/2016 2310   APPEARANCEUR HAZY (A) 05/06/2016 2310   LABSPEC 1.010 05/06/2016 2310   PHURINE 5.5 05/06/2016 2310   GLUCOSEU >1000 (A) 05/06/2016 2310   HGBUR MODERATE (A) 05/06/2016 2310   BILIRUBINUR NEGATIVE 05/06/2016 2310   KETONESUR NEGATIVE 05/06/2016 2310   PROTEINUR NEGATIVE 05/06/2016 2310   UROBILINOGEN 1.0 10/03/2015 0517   NITRITE NEGATIVE 05/06/2016 2310   LEUKOCYTESUR SMALL (A) 05/06/2016 2310   Sepsis Labs: @LABRCNTIP (procalcitonin:4,lacticidven:4) )No results found for this or any previous visit (from the past 240 hour(s)).   Scheduled Meds: . amLODipine  5 mg Oral Daily  . insulin aspart  0-9 Units Subcutaneous TID WC   Continuous Infusions: . sodium chloride 75 mL/hr at 11/26/17 3474    Procedures/Studies: Ct Head Wo Contrast  Result Date: 11/24/2017 CLINICAL DATA:  Syncope and fainting EXAM: CT HEAD WITHOUT CONTRAST TECHNIQUE: Contiguous axial images were obtained from the base of the skull through the vertex without  intravenous contrast. COMPARISON:  Head CT 05/29/2011 FINDINGS: Brain: No mass lesion, intraparenchymal hemorrhage or extra-axial collection. No evidence of acute cortical infarct. There is periventricular hypoattenuation compatible with chronic microvascular disease. Generalized atrophy. Vascular: No hyperdense vessel or unexpected calcification. Skull: Normal visualized skull base, calvarium and extracranial soft tissues. Sinuses/Orbits: No sinus fluid levels or advanced mucosal thickening. No mastoid effusion. Normal orbits. IMPRESSION: Generalized atrophy and advanced chronic ischemic microangiopathy without acute intracranial abnormality. Electronically Signed   By: Ulyses Jarred M.D.   On: 11/24/2017 23:00    Mylah Baynes, DO  [facility] Pager [phone]  If 7PM-7AM, please contact night-coverage [email or web address] Password [email or web address] 11/26/2017, 1:30 PM   LOS: 0 days

## 2017-11-26 NOTE — Progress Notes (Signed)
*  PRELIMINARY RESULTS* Echocardiogram 2D Echocardiogram has been performed by Leavy Cella, RDCS.  Kenneth Potter 11/26/2017, 4:00 PM

## 2017-11-26 NOTE — Evaluation (Signed)
Physical Therapy Evaluation Patient Details Name: Kenneth Potter MRN: [id number] DOB: [birth year] Today's Date: 11/26/2017   History of Present Illness  Kenneth Potter is an 83 y.o. male with moderate dementia, eating at table with dtr, then passed out. They placed in lounge chair, awoke after about 15 mins. No loss of bowel/bladder, no shaking/no rigors or even complaints.  No other focal neuro changes. Brought to ER.    Clinical Impression  Patient functioning near baseline for functional mobility/gait, limited mostly due to fatigue and fair/poor standing balance.  Patient will benefit from continued physical therapy in hospital and recommended venue below to increase strength, balance, endurance for safe ADLs and gait.    Follow Up Recommendations Supervision/Assistance - 24 hour;Outpatient PT(patient will follow up with therapy at previous assisted living facility during the day)    Equipment Recommendations  None recommended by PT    Recommendations for Other Services       Precautions / Restrictions Precautions Precautions: Fall Restrictions Weight Bearing Restrictions: No      Mobility  Bed Mobility Overal bed mobility: Needs Assistance Bed Mobility: Supine to Sit;Sit to Supine     Supine to sit: Min assist Sit to supine: Min assist      Transfers Overall transfer level: Needs assistance Equipment used: Rolling walker (2 wheeled) Transfers: Sit to/from [facility] Sit to Stand: Min assist Stand pivot transfers: Min assist          Ambulation/Gait Ambulation/Gait assistance: Min assist Ambulation Distance (Feet): 65 Feet Assistive device: Rolling walker (2 wheeled) Gait Pattern/deviations: Decreased step length - right;Decreased step length - left;Decreased stride length   Gait velocity interpretation: Below normal speed for age/gender General Gait Details: demonstrates slow labored cadence without loss of balance, verbal cues to step closer  to [employer]    Modified Rankin (Stroke Patients Only)       Balance Overall balance assessment: Needs assistance Sitting-balance support: Feet supported;No upper extremity supported Sitting balance-Leahy Scale: Fair     Standing balance support: Bilateral upper extremity supported;During functional activity Standing balance-Leahy Scale: Fair                               Pertinent Vitals/Pain Pain Assessment: No/denies pain    Home Living Family/patient expects to be discharged to:: Private residence Living Arrangements: Children Available Help at Discharge: Family;Personal care attendant Type of Home: House Home Access: Stairs to enter Entrance Stairs-Rails: Right Entrance Stairs-Number of Steps: 8-9 Home Layout: One level Home Equipment: Programme researcher, broadcasting/film/video - 2 wheels;Shower seat;Bedside commode Additional Comments: information per patient    Prior Function Level of Independence: Needs assistance   Gait / Transfers Assistance Needed: Patient ambulates with RW household distances, uses transport wheelchair for community  ADL's / Homemaking Assistance Needed: assisted by home aides 2 hours in AM and in PM 5 days per week M-F, spends daytime at assisted care facility        Hand Dominance        Extremity/Trunk Assessment   Upper Extremity Assessment Upper Extremity Assessment: Generalized weakness    Lower Extremity Assessment Lower Extremity Assessment: Generalized weakness    Cervical / Trunk Assessment Cervical / Trunk Assessment: Kyphotic  Communication   Communication: No difficulties  Cognition Arousal/Alertness: Awake/alert Behavior During Therapy: WFL for tasks assessed/performed Overall Cognitive Status: Within Functional Limits for tasks  assessed                                        General Comments      Exercises     Assessment/Plan    PT Assessment Patient  needs continued PT services  PT Problem List Decreased strength;Decreased activity tolerance;Decreased balance;Decreased mobility       PT Treatment Interventions Gait training;Functional mobility training;Therapeutic activities;Stair training;Therapeutic exercise;Patient/family education    PT Goals (Current goals can be found in the Care Plan section)  Acute Rehab PT Goals Patient Stated Goal: return home PT Goal Formulation: With patient Time For Goal Achievement: 12/02/17 Potential to Achieve Goals: Good    Frequency Min 3X/week   Barriers to discharge        Co-evaluation               AM-PAC PT "6 Clicks" Daily Activity  Outcome Measure Difficulty turning over in bed (including adjusting bedclothes, sheets and blankets)?: A Lot Difficulty moving from lying on back to sitting on the side of the bed? : A Little Difficulty sitting down on and standing up from a chair with arms (e.g., wheelchair, bedside commode, etc,.)?: A Little Help needed moving to and from a bed to chair (including a wheelchair)?: A Little Help needed walking in hospital room?: A Little Help needed climbing 3-5 steps with a railing? : A Lot 6 Click Score: 16    End of Session Equipment Utilized During Treatment: Gait belt Activity Tolerance: Patient tolerated treatment well;Patient limited by fatigue Patient left: in chair;with call bell/phone within reach;with chair alarm set Nurse Communication: Mobility status PT Visit Diagnosis: Unsteadiness on feet (R26.81);Other abnormalities of gait and mobility (R26.89);Muscle weakness (generalized) (M62.81)    Time: 6237-6283 PT Time Calculation (min) (ACUTE ONLY): 31 min   Charges:   PT Evaluation $PT Eval Moderate Complexity: 1 Mod PT Treatments $Therapeutic Activity: 23-37 mins   PT G Codes:   PT G-Codes **NOT FOR INPATIENT CLASS** Functional Assessment Tool Used: AM-PAC 6 Clicks Basic Mobility Functional Limitation: Mobility: Walking and  moving around Mobility: Walking and Moving Around Current Status (T5176): At least 40 percent but less than 60 percent impaired, limited or restricted Mobility: Walking and Moving Around Goal Status [phone]): At least 40 percent but less than 60 percent impaired, limited or restricted Mobility: Walking and Moving Around Discharge Status [phone]): At least 40 percent but less than 60 percent impaired, limited or restricted    12:17 PM, 11/26/17 Lonell Grandchild, MPT Physical Therapist with [facility] [phone] office [phone] mobile phone

## 2017-11-26 NOTE — Progress Notes (Signed)
Progress Note  Patient Name: Kenneth Potter Date of Encounter: 11/26/2017  Primary Cardiologist: Carlyle Dolly. MD   Subjective   Pleasantly  confused, cooperative.  Denies any recurrent chest pain or dizziness.  Inpatient Medications    Scheduled Meds: . amLODipine  5 mg Oral Daily  . insulin aspart  0-9 Units Subcutaneous TID WC   Continuous Infusions: . sodium chloride 75 mL/hr at 11/26/17 0317   PRN Meds: acetaminophen **OR** acetaminophen, bisacodyl, ondansetron **OR** ondansetron (ZOFRAN) IV, senna-docusate   Vital Signs    Vitals:   11/25/17 0458 11/25/17 0600 11/25/17 2126 11/26/17 0700  BP: (!) 176/86  (!) 142/61 132/74  Pulse: 67  64 66  Resp: 18  18 18   Temp: 98.4 F (36.9 C)  98 F (36.7 C) 98.2 F (36.8 C)  TempSrc: Axillary  Oral Oral  SpO2: 95%  96% 95%  Weight:  128 lb 12.8 oz (58.4 kg)    Height:  5\' 4"  (1.626 m)      Intake/Output Summary (Last 24 hours) at 11/26/2017 1131 Last data filed at 11/26/2017 0701 Gross per 24 hour  Intake 847.5 ml  Output 200 ml  Net 647.5 ml   Filed Weights   11/24/17 2000 11/25/17 0600  Weight: 128 lb 12.8 oz (58.4 kg) 128 lb 12.8 oz (58.4 kg)    Telemetry    Sinus bradycardia, HR 49-55, with occasional PAC's  - Personally Reviewed  Physical Exam   GEN: No acute distress.   Neck: No JVD Cardiac: RRR, cardiac, soft systolic murmur, rubs, or gallops.  Respiratory: Clear to auscultation bilaterally. GI: Soft, nontender, non-distended  MS: No edema; No deformity. Neuro:  Nonfocal  Psych: Flat affect   Labs    Chemistry Recent Labs  Lab 11/24/17 1555 11/25/17 0638 11/26/17 0508  NA 138 137 139  K 3.6 3.6 3.7  CL 105 108 112*  CO2 24 22 23   GLUCOSE 199* 132* 106*  BUN 13 11 16   CREATININE 1.46* 0.94 1.08  CALCIUM 9.3 8.8* 8.6*  PROT 7.3 6.5 5.9*  ALBUMIN 3.8 3.3* 3.0*  AST 18 14* 13*  ALT 11* 9* 9*  ALKPHOS 98 91 77  BILITOT 0.7 1.0 0.7  GFRNONAA 41* >60 59*  GFRAA 47* >60 >60    ANIONGAP 9 7 4*     Hematology Recent Labs  Lab 11/24/17 1555 11/25/17 0638 11/26/17 0508  WBC 7.4 6.4 7.1  RBC 4.65 4.44 4.23  HGB 15.0 14.4 13.7  HCT 45.6 42.8 41.5  MCV 98.1 96.4 98.1  MCH 32.3 32.4 32.4  MCHC 32.9 33.6 33.0  RDW 13.2 12.9 13.3  PLT 182 187 177    Cardiac Enzymes Recent Labs  Lab 11/24/17 1555 11/25/17 0638  TROPONINI <0.03 <0.03   No results for input(s): TROPIPOC in the last 168 hours.      Radiology    Ct Head Wo Contrast  Result Date: 11/24/2017 CLINICAL DATA:  Syncope and fainting EXAM: CT HEAD WITHOUT CONTRAST TECHNIQUE: Contiguous axial images were obtained from the base of the skull through the vertex without intravenous contrast. COMPARISON:  Head CT 05/29/2011 FINDINGS: Brain: No mass lesion, intraparenchymal hemorrhage or extra-axial collection. No evidence of acute cortical infarct. There is periventricular hypoattenuation compatible with chronic microvascular disease. Generalized atrophy. Vascular: No hyperdense vessel or unexpected calcification. Skull: Normal visualized skull base, calvarium and extracranial soft tissues. Sinuses/Orbits: No sinus fluid levels or advanced mucosal thickening. No mastoid effusion. Normal orbits. IMPRESSION: Generalized atrophy and advanced  chronic ischemic microangiopathy without acute intracranial abnormality. Electronically Signed   By: Ulyses Jarred M.D.   On: 11/24/2017 23:00    Cardiac Studies   Pending result.   Patient Profile     83 y.o. male with known history of nonobstructive CAD, hypertension, hyperlipidemia, bradycardia, type 2 diabetes, and progressive dementia who was admitted with syncopal episode.  Assessment & Plan    1. Syncope: No further episodes since admission.  Do not find orthostatic documentation.  He does not appear to be dehydrated.  He is not on diuretics.  Order orthostatic blood pressures for reevaluation.  He currently is on amlodipine 10 mg daily.  2.  Bradycardia:  View of telemetry demonstrate heart rate between 49 bpm to 55 bpm.  There were no pauses.  He does have occasional PACs.  He is not on any AV nodal blocking agents currently.  Echocardiogram is pending.  Currently no indication for pacemaker at this time.  3.  History of hypertension: Currently on amlodipine 10 mg daily.  May need to adjust and allow for higher blood pressure to avoid dizziness and syncope.  We will repeat orthostatics.  Signed, Phill Myron. West Pugh, ANP, AACC   11/26/2017, 11:31 AM     Attending note:  Patient seen and examined.  Reviewed interval hospital course and discussed the case with Ms. West Pugh.  Patient has had no obvious episodes of syncope under observation.  Telemetry shows sinus bradycardia, but no pauses, and no associated symptomatology.  Plan is to follow-up on echocardiogram which has already been ordered.  Agree with obtaining a follow-up set of orthostatics to make sure that he does not need any adjustment in his medications.  Satira Sark, M.D., F.A.C.C.

## 2017-11-26 NOTE — Progress Notes (Signed)
EEG Completed; Results Pending  

## 2017-11-27 DIAGNOSIS — E1165 Type 2 diabetes mellitus with hyperglycemia: Secondary | ICD-10-CM | POA: Diagnosis not present

## 2017-11-27 DIAGNOSIS — R001 Bradycardia, unspecified: Secondary | ICD-10-CM | POA: Diagnosis not present

## 2017-11-27 DIAGNOSIS — R55 Syncope and collapse: Secondary | ICD-10-CM | POA: Diagnosis not present

## 2017-11-27 LAB — CBC
HEMATOCRIT: 38.6 % — AB (ref 39.0–52.0)
HEMOGLOBIN: 13.3 g/dL (ref 13.0–17.0)
MCH: 32.9 pg (ref 26.0–34.0)
MCHC: 34.5 g/dL (ref 30.0–36.0)
MCV: 95.5 fL (ref 78.0–100.0)
Platelets: 125 10*3/uL — ABNORMAL LOW (ref 150–400)
RBC: 4.04 MIL/uL — ABNORMAL LOW (ref 4.22–5.81)
RDW: 13.1 % (ref 11.5–15.5)
WBC: 7.4 10*3/uL (ref 4.0–10.5)

## 2017-11-27 LAB — COMPREHENSIVE METABOLIC PANEL
ALT: 8 U/L — ABNORMAL LOW (ref 17–63)
AST: 15 U/L (ref 15–41)
Albumin: 3.1 g/dL — ABNORMAL LOW (ref 3.5–5.0)
Alkaline Phosphatase: 85 U/L (ref 38–126)
Anion gap: 6 (ref 5–15)
BUN: 17 mg/dL (ref 6–20)
CO2: 23 mmol/L (ref 22–32)
Calcium: 8.6 mg/dL — ABNORMAL LOW (ref 8.9–10.3)
Chloride: 107 mmol/L (ref 101–111)
Creatinine, Ser: 1.11 mg/dL (ref 0.61–1.24)
GFR calc Af Amer: >60 mL/min (ref >60)
GFR calc non Af Amer: 57 mL/min — ABNORMAL LOW (ref >60)
Glucose, Bld: 120 mg/dL — ABNORMAL HIGH (ref 65–99)
Potassium: 3.4 mmol/L — ABNORMAL LOW (ref 3.5–5.1)
Sodium: 136 mmol/L (ref 135–145)
Total Bilirubin: 0.5 mg/dL (ref 0.3–1.2)
Total Protein: 5.9 g/dL — ABNORMAL LOW (ref 6.5–8.1)

## 2017-11-27 LAB — GLUCOSE, CAPILLARY
Glucose-Capillary: 126 mg/dL — ABNORMAL HIGH (ref 65–99)
Glucose-Capillary: 124 mg/dL — ABNORMAL HIGH (ref 65–99)

## 2017-11-27 NOTE — Discharge Summary (Signed)
Physician Discharge Summary  Kenneth Potter JDY:[id number] DOB: 10/25/1926 DOA: 11/24/2017  PCP: No primary care provider on file.  Admit date: 11/24/2017 Discharge date: 11/27/2017  Admitted From: Home Disposition: Home  Recommendations for Outpatient Follow-up:  1. Follow up with PCP in 1-2 weeks 2. Schedule follow-up with Dr. Harl Bowie to discuss outpatient cardiac monitoring for bradycardia 3. Please obtain BMP/CBC in one week  Home Health: [facility] is part of [facility] Equipment/Devices: None  Discharge Condition: Stable CODE STATUS: DNR Diet recommendation: Regular  Brief/Interim Summary: 83 year old male with a history of dementia, diabetes mellitus, hypertension, nonobstructive CAD, and diastolic dysfunction presented after a syncopal episode. The patient was sitting at the dinner table eating dinner with his daughters when he slumped over and became unresponsive. His daughter to try to wake him up, but he was not responsive. He lost consciousness for approximately 5-7 minutes according to his daughter. EMS was activated. Upon arrival, the patient was noted to have bradycardia with heart rate in the 40s, and his CBG was 231. His daughter stated that when he woke up, his speech appeared to be slurred, but this resolved after a few minutes. The patient did not have any prodromal symptoms including dizziness, chest discomfort, or shortness of breath. There was no witnessed tonic-clonic activity nor bladder or bowel incontinence. In the emergency department, the patient was afebrile hemodynamically stable. CT of the brain was negative. BMP and CBC were essentially unremarkable except for serum creatinine 1.46. Orthostatics were negative initially.  Cardiology was consulted for patient's bradycardia.  His echocardiogram showed an ejection fraction of 55% with no wall abnormalities and no valvular abdomen his EEG showed diffuse slowing.  Patient was stable for discharge on  11/27/2017.  Cardiology recommend outpatient follow-up with Dr. Harl Bowie, patient's primary cardiologist, for further monitoring if warranted.  This was discussed with patient's son and daughter who voiced understanding and all questions were answered.  His creatinine had improved to 1.11 on day of discharge.    Discharge Diagnoses:  Active Problems:   Syncope   Sinus bradycardia   Uncontrolled type 2 diabetes mellitus with hyperglycemia, without long-term current use of insulin [facility])    Discharge Instructions  Discharge Instructions    Call MD for:  difficulty breathing, headache or visual disturbances   Complete by:  As directed    Call MD for:  extreme fatigue   Complete by:  As directed    Call MD for:  hives   Complete by:  As directed    Call MD for:  persistant dizziness or light-headedness   Complete by:  As directed    Call MD for:  persistant nausea and vomiting   Complete by:  As directed    Call MD for:  severe uncontrolled pain   Complete by:  As directed    Call MD for:  temperature >100.4   Complete by:  As directed    Diet - low sodium heart healthy   Complete by:  As directed    Discharge instructions   Complete by:  As directed    Please follow-up with primary care physician of the [facility] Please follow-up with Dr. Harl Bowie to discuss outpatient cardiac monitoring for bradycardia   Increase activity slowly   Complete by:  As directed      Allergies as of 11/27/2017      Reactions   Codeine Nausea Only   Nausea per family   Aspirin    Can not tolerate in high dosage  Medication List    TAKE these medications   amLODipine 5 MG tablet Commonly known as:  NORVASC Take 1 tablet (5 mg total) by mouth daily.   glipiZIDE 2.5 MG 24 hr tablet Commonly known as:  GLUCOTROL XL Take 2.5 mg by mouth daily with breakfast.   senna-docusate 8.6-50 MG tablet Commonly known as:  Senokot-S Take 1 tablet by mouth 2 (two) times daily. What changed:  how  much to take   SM MELATONIN 3 MG Tabs Generic drug:  Melatonin Take 3 mg by mouth at bedtime.      Follow-up Information    Branch, Alphonse Guild, MD. Schedule an appointment as soon as possible for a visit in 2 week(s).   Specialty:  Cardiology Contact information: [place] [address] [phone]          Allergies  Allergen Reactions  . Codeine Nausea Only    Nausea per family  . Aspirin     Can not tolerate in high dosage    Consultations:  Cardiology    Procedures/Studies: Ct Head Wo Contrast  Result Date: 11/24/2017 CLINICAL DATA:  Syncope and fainting EXAM: CT HEAD WITHOUT CONTRAST TECHNIQUE: Contiguous axial images were obtained from the base of the skull through the vertex without intravenous contrast. COMPARISON:  Head CT 05/29/2011 FINDINGS: Brain: No mass lesion, intraparenchymal hemorrhage or extra-axial collection. No evidence of acute cortical infarct. There is periventricular hypoattenuation compatible with chronic microvascular disease. Generalized atrophy. Vascular: No hyperdense vessel or unexpected calcification. Skull: Normal visualized skull base, calvarium and extracranial soft tissues. Sinuses/Orbits: No sinus fluid levels or advanced mucosal thickening. No mastoid effusion. Normal orbits. IMPRESSION: Generalized atrophy and advanced chronic ischemic microangiopathy without acute intracranial abnormality. Electronically Signed   By: Ulyses Jarred M.D.   On: 11/24/2017 23:00   Echocardiogram:Mild LVH with LVEF 55-60% and grade 1 diastolic dysfunction.   Trivial mitral regurgitation. Mildly calcified aortic annulus   with trivial aortic regurgitation. Mild tricuspid regurgitation   with estimated PASP 33 mmHg.   Subjective: Patient is sitting in chair at time of exam he is watching television.  He is minimally verbally responsive.  He denies having any pain.  He is confused. Discharge Exam: Vitals:   11/27/17 0651 11/27/17 1307   BP: 132/68 137/67  Pulse: 60 60  Resp: 18 16  Temp: 98.1 F (36.7 C)   SpO2: 98% 100%   Vitals:   11/26/17 1427 11/26/17 2044 11/27/17 0651 11/27/17 1307  BP: (!) 151/80  132/68 137/67  Pulse: 72  60 60  Resp: 18 18 18 16   Temp:  98.4 F (36.9 C) 98.1 F (36.7 C)   TempSrc: Oral Oral Oral   SpO2: 97% 98% 98% 100%  Weight:      Height:        General: Pt is alert, awake, not in acute distress Cardiovascular: RRR, S1/S2 +, no rubs, no gallops, soft systolic murmur appreciated Respiratory: CTA bilaterally, no wheezing, no rhonchi Abdominal: Soft, NT, ND, bowel sounds + Extremities: no edema, no cyanosis    The results of significant diagnostics from this hospitalization (including imaging, microbiology, ancillary and laboratory) are listed below for reference.     Microbiology: No results found for this or any previous visit (from the past 240 hour(s)).   Labs: BNP (last 3 results) No results for input(s): BNP in the last 8760 hours. Basic Metabolic Panel: Recent Labs  Lab 11/24/17 1555 11/25/17 0638 11/26/17 0508 11/27/17 0505  NA 138  137 139 136  K 3.6 3.6 3.7 3.4*  CL 105 108 112* 107  CO2 24 22 23 23   GLUCOSE 199* 132* 106* 120*  BUN 13 11 16 17   CREATININE 1.46* 0.94 1.08 1.11  CALCIUM 9.3 8.8* 8.6* 8.6*  MG  --   --  1.9  --    Liver Function Tests: Recent Labs  Lab 11/24/17 1555 11/25/17 0638 11/26/17 0508 11/27/17 0505  AST 18 14* 13* 15  ALT 11* 9* 9* 8*  ALKPHOS 98 91 77 85  BILITOT 0.7 1.0 0.7 0.5  PROT 7.3 6.5 5.9* 5.9*  ALBUMIN 3.8 3.3* 3.0* 3.1*   No results for input(s): LIPASE, AMYLASE in the last 168 hours. No results for input(s): AMMONIA in the last 168 hours. CBC: Recent Labs  Lab 11/24/17 1555 11/25/17 0638 11/26/17 0508 11/27/17 0505  WBC 7.4 6.4 7.1 7.4  NEUTROABS 5.6  --   --   --   HGB 15.0 14.4 13.7 13.3  HCT 45.6 42.8 41.5 38.6*  MCV 98.1 96.4 98.1 95.5  PLT 182 187 177 125*   Cardiac Enzymes: Recent Labs   Lab 11/24/17 1555 11/25/17 0638  TROPONINI <0.03 <0.03   BNP: Invalid input(s): POCBNP CBG: Recent Labs  Lab 11/26/17 1101 11/26/17 1727 11/26/17 2047 11/27/17 0722 11/27/17 1117  GLUCAP 115* 112* 182* 126* 124*   D-Dimer No results for input(s): DDIMER in the last 72 hours. Hgb A1c Recent Labs    11/25/17 0638 11/26/17 0508  HGBA1C 6.6* 6.3*   Lipid Profile No results for input(s): CHOL, HDL, LDLCALC, TRIG, CHOLHDL, LDLDIRECT in the last 72 hours. Thyroid function studies Recent Labs    11/25/17 0638  TSH 0.962   Anemia work up No results for input(s): VITAMINB12, FOLATE, FERRITIN, TIBC, IRON, RETICCTPCT in the last 72 hours. Urinalysis    Component Value Date/Time   COLORURINE YELLOW 05/06/2016 2310   APPEARANCEUR HAZY (A) 05/06/2016 2310   LABSPEC 1.010 05/06/2016 2310   PHURINE 5.5 05/06/2016 2310   GLUCOSEU >1000 (A) 05/06/2016 2310   HGBUR MODERATE (A) 05/06/2016 2310   BILIRUBINUR NEGATIVE 05/06/2016 2310   KETONESUR NEGATIVE 05/06/2016 2310   PROTEINUR NEGATIVE 05/06/2016 2310   UROBILINOGEN 1.0 10/03/2015 0517   NITRITE NEGATIVE 05/06/2016 2310   LEUKOCYTESUR SMALL (A) 05/06/2016 2310   Sepsis Labs Invalid input(s): PROCALCITONIN,  WBC,  LACTICIDVEN Microbiology No results found for this or any previous visit (from the past 240 hour(s)).   Time coordinating discharge: 35 minutes  SIGNED:   Loretha Stapler, MD  [facility] 11/27/2017, 2:00 PM Pager [phone] If 7PM-7AM, please contact night-coverage [email or web address] Password [email or web address]

## 2017-11-27 NOTE — Plan of Care (Signed)
Pt alert  and oriented to self and family members. No complaint of pain or distress at this time. Midline placed by PICC nurse.  NS at 64ml/hr. Needs anticipated. Bed alarm in place. Bed in lowest position, call light within reach. Will continue to monitor.

## 2017-11-27 NOTE — Procedures (Signed)
  Kenneth A. Merlene Laughter, MD     [email or web address]           HISTORY: This is a 83 year old male who presents with episode of loss of consciousness and syncope suspicious for possible seizures.  MEDICATIONS: Scheduled Meds: Continuous Infusions: PRN Meds:.  Prior to Admission medications   Medication Sig Start Date End Date Taking? Authorizing Provider  amLODipine (NORVASC) 5 MG tablet Take 1 tablet (5 mg total) by mouth daily. 07/05/15   Alycia Rossetti, MD  glipiZIDE (GLUCOTROL XL) 2.5 MG 24 hr tablet Take 2.5 mg by mouth daily with breakfast.    [provider]  Melatonin (SM MELATONIN) 3 MG TABS Take 3 mg by mouth at bedtime.    [provider]  senna-docusate (SENOKOT-S) 8.6-50 MG tablet Take 1 tablet by mouth 2 (two) times daily. Patient taking differently: Take 2 tablets by mouth 2 (two) times daily.  10/04/15   Elgergawy, Silver Huguenin, MD      ANALYSIS: A 16 channel recording using standard 10 20 measurements is conducted for 21 minutes.   The background activity gets as high as seven hertz bilaterally.  Beta activity observed is at the frontal areas.  Awake and drowsy activities are observed.  Photic stimulation and hyperventilation are not carried out.  There is no focal or lateralized slowing.  There is no epileptiform activity is observed.  The recording also shows significant myogenic changes from time to time.   IMPRESSION: 1.   This recording shows mild global slowing.  However, there is no epileptiform activity is observed.      Raychell Holcomb A. Merlene Potter, M.D.  Diplomate, Tax adviser of Psychiatry and Neurology ( Neurology).

## 2017-11-27 NOTE — Progress Notes (Addendum)
Progress Note  Patient Name: Kenneth Potter Date of Encounter: 11/27/2017  Primary Cardiologist: Dr. Harl Bowie  Subjective   Pleasantly confused. No reported chest pain or dizziness.  Inpatient Medications    Scheduled Meds: . amLODipine  5 mg Oral Daily  . insulin aspart  0-9 Units Subcutaneous TID WC   Continuous Infusions: . sodium chloride 75 mL/hr at 11/26/17 0317   PRN Meds: acetaminophen **OR** acetaminophen, bisacodyl, ondansetron **OR** ondansetron (ZOFRAN) IV, senna-docusate   Vital Signs    Vitals:   11/26/17 0700 11/26/17 1427 11/26/17 2044 11/27/17 0651  BP: 132/74 (!) 151/80  132/68  Pulse: 66 72  60  Resp: 18 18 18 18   Temp: 98.2 F (36.8 C)  98.4 F (36.9 C) 98.1 F (36.7 C)  TempSrc: Oral Oral Oral Oral  SpO2: 95% 97% 98% 98%  Weight:      Height:        Intake/Output Summary (Last 24 hours) at 11/27/2017 1009 Last data filed at 11/27/2017 0900 Gross per 24 hour  Intake 120 ml  Output 750 ml  Net -630 ml   Filed Weights   11/24/17 2000 11/25/17 0600  Weight: 128 lb 12.8 oz (58.4 kg) 128 lb 12.8 oz (58.4 kg)    Telemetry    Sinus bradycardia, heart rate to high 40s intermittently but no syncope. Occasional PVCs - Personally Reviewed  Physical Exam    GEN: Elderly male. No acute distress.   Neck: No JVD Cardiac: RRR, soft systolic murmur, no gallop. Respiratory: Clear to auscultation bilaterally. GI: Soft, nontender, non-distended  MS: No edema; No deformity. Neuro:  Nonfocal   Labs    Chemistry Recent Labs  Lab 11/25/17 0638 11/26/17 0508 11/27/17 0505  NA 137 139 136  K 3.6 3.7 3.4*  CL 108 112* 107  CO2 22 23 23   GLUCOSE 132* 106* 120*  BUN 11 16 17   CREATININE 0.94 1.08 1.11  CALCIUM 8.8* 8.6* 8.6*  PROT 6.5 5.9* 5.9*  ALBUMIN 3.3* 3.0* 3.1*  AST 14* 13* 15  ALT 9* 9* 8*  ALKPHOS 91 77 85  BILITOT 1.0 0.7 0.5  GFRNONAA >60 59* 57*  GFRAA >60 >60 >60  ANIONGAP 7 4* 6     Hematology Recent Labs  Lab  11/25/17 0638 11/26/17 0508 11/27/17 0505  WBC 6.4 7.1 7.4  RBC 4.44 4.23 4.04*  HGB 14.4 13.7 13.3  HCT 42.8 41.5 38.6*  MCV 96.4 98.1 95.5  MCH 32.4 32.4 32.9  MCHC 33.6 33.0 34.5  RDW 12.9 13.3 13.1  PLT 187 177 125*    Cardiac Enzymes Recent Labs  Lab 11/24/17 1555 11/25/17 0638  TROPONINI <0.03 <0.03   No results for input(s): TROPIPOC in the last 168 hours.   Radiology    No results found.  Cardiac Studies   Echocardiogram 11/26/2017: Study Conclusions   - Left ventricle: The cavity size was normal. Wall thickness was   increased in a pattern of mild LVH. Systolic function was normal.   The estimated ejection fraction was in the range of 55% to 60%.   Wall motion was normal; there were no regional wall motion   abnormalities. Doppler parameters are consistent with abnormal   left ventricular relaxation (grade 1 diastolic dysfunction). - Aortic valve: Mildly calcified annulus. Trileaflet. There was   trivial regurgitation. - Mitral valve: There was trivial regurgitation. - Tricuspid valve: There was mild regurgitation. - Pulmonary arteries: PA peak pressure: 33 mm Hg (S). - Pericardium, extracardiac: There  was no pericardial effusion.   Impressions:   - Mild LVH with LVEF 55-60% and grade 1 diastolic dysfunction.   Trivial mitral regurgitation. Mildly calcified aortic annulus   with trivial aortic regurgitation. Mild tricuspid regurgitation   with estimated PASP 33 mmHg.  Patient Profile     83 y.o. male with known history of nonobstructive CAD, hypertension, hyperlipidemia, bradycardia, type 2 diabetes, and progressive dementia who was admitted with syncopal episode.   Assessment & Plan    1. Syncope: Not orthostatic. EEG without epileptiform activity.   2.  Bradycardia:  Sinus bradycardia to 40's at times but not associated with symptoms. No pauses or heart block.    3.  History of hypertension:  Amlodipine decreased to 5 mg daily.  For questions  or updates, please contact [place] Please consult [email or web address] for contact info under Cardiology/STEMI.      Signed, Ermalinda Barrios, PA-C  11/27/2017, 10:09 AM     Attending note:  Patient seen and examined.  Reviewed interval hospital course and discussed the case with Ms. Bonnell Public PA-C.  Patient not found to be orthostatic on follow-up measurements.  He also had an EEG that showed no epileptiform activity with mild global slowing.  I personally reviewed telemetry which shows episodes of sinus bradycardia into the upper 40s, but not associated with any symptoms.  Importantly, he had no pauses or heart block.  Follow-up echocardiogram from yesterday shows LVEF 55-60% with no major valvular abnormalities.  At this point no further inpatient cardiac testing is planned.  Could consider additional outpatient monitoring and follow-up with Dr. Harl Bowie.  We will sign off for now.  Satira Sark, M.D., F.A.C.C.

## 2017-11-27 NOTE — Care Management (Signed)
Left voicemail with Darrel Hoover to notify of patient discharging home.

## 2017-11-27 NOTE — Progress Notes (Signed)
Physical Therapy Treatment Patient Details Name: Kenneth Potter MRN: [id number] DOB: 05/24/1926 Today's Date: 11/27/2017    History of Present Illness KHRISTOPHER KAPAUN is an 83 y.o. male with moderate dementia, eating at table with dtr, then passed out. They placed in lounge chair, awoke after about 15 mins. No loss of bowel/bladder, no shaking/no rigors or even complaints.  No other focal neuro changes. Brought to ER.    PT Comments    Pt supine in bed and willing to participate with family in room at entrance.  Pt with difficutly following commands with increased verbal and tactile cueing to complete bed mobility and gait training today.  Tactile cueing for proper hand placement with sit to stand for safety and verbal cueing to improve gait mechanics for safety including heel to toe sequence and to keep RW closer to body.  EOS pt left in chair with call bell within reach and chair alarm set.  No reports of pain through session, was limited by fatigue.      Follow Up Recommendations  Supervision/Assistance - 24 hour;Outpatient PT     Equipment Recommendations  None recommended by PT    Recommendations for Other Services       Precautions / Restrictions Precautions Precautions: Fall Restrictions Weight Bearing Restrictions: No    Mobility  Bed Mobility Overal bed mobility: Needs Assistance Bed Mobility: Supine to Sit     Supine to sit: Min assist        Transfers Overall transfer level: Needs assistance Equipment used: Rolling walker (2 wheeled) Transfers: Sit to/from Stand Sit to Stand: Min assist            Ambulation/Gait Ambulation/Gait assistance: Min assist Ambulation Distance (Feet): 45 Feet(limited by fatigue) Assistive device: Rolling walker (2 wheeled) Gait Pattern/deviations: Decreased step length - right;Decreased step length - left;Decreased stride length     General Gait Details: demonstrates slow labored cadence without loss of balance, verbal  cues to step closer to [employer]    Modified Rankin (Stroke Patients Only)       Balance                                            Cognition Arousal/Alertness: Awake/alert Behavior During Therapy: WFL for tasks assessed/performed(delayed response, unable to follow commands without assistance) Overall Cognitive Status: History of cognitive impairments - at baseline                                        Exercises      General Comments        Pertinent Vitals/Pain Pain Assessment: No/denies pain    Home Living                      Prior Function            PT Goals (current goals can now be found in the care plan section) Acute Rehab PT Goals Patient Stated Goal: return home PT Goal Formulation: With patient Time For Goal Achievement: 12/02/17 Progress towards PT goals: Progressing toward goals    Frequency    Min 3X/week      PT Plan  Co-evaluation              AM-PAC PT "6 Clicks" Daily Activity  Outcome Measure  Difficulty turning over in bed (including adjusting bedclothes, sheets and blankets)?: A Lot Difficulty moving from lying on back to sitting on the side of the bed? : A Lot Difficulty sitting down on and standing up from a chair with arms (e.g., wheelchair, bedside commode, etc,.)?: A Little Help needed moving to and from a bed to chair (including a wheelchair)?: A Little Help needed walking in hospital room?: A Little Help needed climbing 3-5 steps with a railing? : A Lot 6 Click Score: 15    End of Session Equipment Utilized During Treatment: Gait belt Activity Tolerance: Patient tolerated treatment well;Patient limited by fatigue Patient left: in chair;with call bell/phone within reach;with chair alarm set Nurse Communication: Mobility status PT Visit Diagnosis: Unsteadiness on feet (R26.81);Other abnormalities of gait and mobility  (R26.89);Muscle weakness (generalized) (M62.81)     Time: 3016-0109 PT Time Calculation (min) (ACUTE ONLY): 21 min  Charges:  $Therapeutic Activity: 8-22 mins                    G Codes:       Ihor Austin, LPTA; CBIS [phone]   Aldona Lento 11/27/2017, 1:08 PM

## 2017-12-12 ENCOUNTER — Ambulatory Visit: Payer: Medicare (Managed Care) | Admitting: Student
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
## 2018-01-02 ENCOUNTER — Ambulatory Visit (INDEPENDENT_AMBULATORY_CARE_PROVIDER_SITE_OTHER): Payer: Medicare (Managed Care) | Admitting: Student

## 2018-01-02 ENCOUNTER — Encounter: Payer: Self-pay | Admitting: Student

## 2018-01-02 VITALS — BP 108/58 | HR 64 | Ht 64.0 in | Wt 123.0 lb

## 2018-01-02 DIAGNOSIS — R55 Syncope and collapse: Secondary | ICD-10-CM | POA: Diagnosis not present

## 2018-01-02 DIAGNOSIS — I10 Essential (primary) hypertension: Secondary | ICD-10-CM

## 2018-01-02 DIAGNOSIS — F0390 Unspecified dementia without behavioral disturbance: Secondary | ICD-10-CM

## 2018-01-02 DIAGNOSIS — I2510 Atherosclerotic heart disease of native coronary artery without angina pectoris: Secondary | ICD-10-CM | POA: Diagnosis not present

## 2018-01-02 NOTE — Patient Instructions (Signed)
Medication Instructions:  Your physician recommends that you continue on your current medications as directed. Please refer to the Current Medication list given to you today.   Labwork: NONE   Testing/Procedures: NONE   Follow-Up: Your physician wants you to follow-up in: 6 Months. You will receive a reminder letter in the mail two months in advance. If you don't receive a letter, please call our office to schedule the follow-up appointment.   Any Other Special Instructions Will Be Listed Below (If Applicable).     If you need a refill on your cardiac medications before your next appointment, please call your pharmacy.  Thank you for choosing [facility]!   

## 2018-04-07 ENCOUNTER — Other Ambulatory Visit (HOSPITAL_COMMUNITY): Payer: Self-pay | Admitting: Nurse Practitioner

## 2018-04-07 ENCOUNTER — Encounter (HOSPITAL_COMMUNITY): Payer: Self-pay

## 2018-04-07 ENCOUNTER — Ambulatory Visit (HOSPITAL_COMMUNITY)
Admission: RE | Admit: 2018-04-07 | Discharge: 2018-04-07 | Disposition: A | Discharge status: Home / Self Care | Payer: Medicare (Managed Care) | Source: Ambulatory Visit | Attending: Nurse Practitioner | Admitting: Nurse Practitioner

## 2018-04-07 DIAGNOSIS — R269 Unspecified abnormalities of gait and mobility: Secondary | ICD-10-CM | POA: Diagnosis not present

## 2018-04-07 DIAGNOSIS — G319 Degenerative disease of nervous system, unspecified: Secondary | ICD-10-CM | POA: Insufficient documentation

## 2018-09-30 DEATH — deceased
